# Patient Record
Sex: Female | Born: 1970
Health system: Southern US, Community
[De-identification: ages and names within clinical notes are randomized; demographics above are authoritative.]

## PROBLEM LIST (undated history)

## (undated) DIAGNOSIS — R609 Edema, unspecified: Secondary | ICD-10-CM

## (undated) DIAGNOSIS — K219 Gastro-esophageal reflux disease without esophagitis: Secondary | ICD-10-CM

## (undated) DIAGNOSIS — I10 Essential (primary) hypertension: Secondary | ICD-10-CM

## (undated) DIAGNOSIS — Z9889 Other specified postprocedural states: Secondary | ICD-10-CM

## (undated) DIAGNOSIS — E559 Vitamin D deficiency, unspecified: Secondary | ICD-10-CM

## (undated) DIAGNOSIS — J45909 Unspecified asthma, uncomplicated: Secondary | ICD-10-CM

## (undated) DIAGNOSIS — R112 Nausea with vomiting, unspecified: Secondary | ICD-10-CM

## (undated) DIAGNOSIS — D649 Anemia, unspecified: Secondary | ICD-10-CM

## (undated) DIAGNOSIS — E215 Disorder of parathyroid gland, unspecified: Secondary | ICD-10-CM

## (undated) DIAGNOSIS — T8859XA Other complications of anesthesia, initial encounter: Secondary | ICD-10-CM

## (undated) DIAGNOSIS — M549 Dorsalgia, unspecified: Secondary | ICD-10-CM

## (undated) DIAGNOSIS — R51 Headache: Secondary | ICD-10-CM

## (undated) DIAGNOSIS — M2550 Pain in unspecified joint: Secondary | ICD-10-CM

## (undated) DIAGNOSIS — R519 Headache, unspecified: Secondary | ICD-10-CM

## (undated) DIAGNOSIS — G43909 Migraine, unspecified, not intractable, without status migrainosus: Secondary | ICD-10-CM

## (undated) HISTORY — DX: Edema, unspecified: R60.9

## (undated) HISTORY — DX: Pain in unspecified joint: M25.50

## (undated) HISTORY — PX: TUBAL LIGATION: SHX77

## (undated) HISTORY — DX: Dorsalgia, unspecified: M54.9

## (undated) HISTORY — PX: ABDOMINAL HYSTERECTOMY: SHX81

## (undated) HISTORY — DX: Migraine, unspecified, not intractable, without status migrainosus: G43.909

## (undated) HISTORY — DX: Vitamin D deficiency, unspecified: E55.9

---

## 1999-07-07 HISTORY — PX: FOOT SURGERY: SHX648

## 2003-06-09 ENCOUNTER — Inpatient Hospital Stay (HOSPITAL_COMMUNITY): Admission: EM | Admit: 2003-06-09 | Discharge: 2003-06-13 | Payer: Self-pay | Admitting: Emergency Medicine

## 2003-06-20 ENCOUNTER — Emergency Department (HOSPITAL_COMMUNITY): Admission: EM | Admit: 2003-06-20 | Discharge: 2003-06-20 | Payer: Self-pay | Admitting: Emergency Medicine

## 2003-09-13 HISTORY — PX: ESOPHAGOGASTRODUODENOSCOPY: SHX1529

## 2010-05-02 ENCOUNTER — Emergency Department (HOSPITAL_COMMUNITY): Admission: EM | Admit: 2010-05-02 | Discharge: 2010-05-02 | Payer: Self-pay | Admitting: Emergency Medicine

## 2010-09-17 LAB — POCT URINALYSIS DIPSTICK
Bilirubin Urine: NEGATIVE
Glucose, UA: NEGATIVE mg/dL
Ketones, ur: NEGATIVE mg/dL
Nitrite: NEGATIVE
Protein, ur: NEGATIVE mg/dL
Specific Gravity, Urine: 1.02 (ref 1.005–1.030)
Urobilinogen, UA: 0.2 mg/dL (ref 0.0–1.0)
pH: 7 (ref 5.0–8.0)
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
## 2012-04-25 ENCOUNTER — Other Ambulatory Visit: Payer: Self-pay | Admitting: Obstetrics and Gynecology

## 2012-04-25 DIAGNOSIS — Z1231 Encounter for screening mammogram for malignant neoplasm of breast: Secondary | ICD-10-CM

## 2012-05-06 ENCOUNTER — Ambulatory Visit: Payer: Self-pay

## 2012-06-13 ENCOUNTER — Ambulatory Visit: Payer: Self-pay

## 2012-06-27 ENCOUNTER — Ambulatory Visit
Admission: RE | Admit: 2012-06-27 | Discharge: 2012-06-27 | Disposition: A | Discharge status: Home / Self Care | Payer: 59 | Source: Ambulatory Visit | Attending: Obstetrics and Gynecology | Admitting: Obstetrics and Gynecology

## 2012-06-27 DIAGNOSIS — Z1231 Encounter for screening mammogram for malignant neoplasm of breast: Secondary | ICD-10-CM

## 2013-12-06 ENCOUNTER — Other Ambulatory Visit: Payer: Self-pay

## 2013-12-06 DIAGNOSIS — Z1231 Encounter for screening mammogram for malignant neoplasm of breast: Secondary | ICD-10-CM

## 2013-12-18 ENCOUNTER — Ambulatory Visit
Admission: RE | Admit: 2013-12-18 | Discharge: 2013-12-18 | Disposition: A | Discharge status: Home / Self Care | Payer: 59 | Source: Ambulatory Visit

## 2013-12-18 DIAGNOSIS — Z1231 Encounter for screening mammogram for malignant neoplasm of breast: Secondary | ICD-10-CM

## 2013-12-20 ENCOUNTER — Other Ambulatory Visit: Payer: Self-pay | Admitting: Obstetrics and Gynecology

## 2013-12-20 DIAGNOSIS — R928 Other abnormal and inconclusive findings on diagnostic imaging of breast: Secondary | ICD-10-CM

## 2014-01-01 ENCOUNTER — Ambulatory Visit
Admission: RE | Admit: 2014-01-01 | Discharge: 2014-01-01 | Disposition: A | Discharge status: Home / Self Care | Payer: 59 | Source: Ambulatory Visit | Attending: Obstetrics and Gynecology | Admitting: Obstetrics and Gynecology

## 2014-01-01 DIAGNOSIS — R928 Other abnormal and inconclusive findings on diagnostic imaging of breast: Secondary | ICD-10-CM

## 2015-02-15 ENCOUNTER — Ambulatory Visit (INDEPENDENT_AMBULATORY_CARE_PROVIDER_SITE_OTHER): Payer: 59 | Admitting: Podiatry

## 2015-02-15 DIAGNOSIS — M722 Plantar fascial fibromatosis: Secondary | ICD-10-CM | POA: Diagnosis not present

## 2015-02-21 NOTE — Progress Notes (Signed)
Subjective:     Patient ID: [name], female   DOB: 10-Oct-1970, 45 y.o.   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 She has had pain to the same area over the last several months, but it has worsened recently. Denies any injury or trauma. She denies any swelling or redness. No tenderness. The pain does not wake up at night. Most of her pain is with weightbearing and after standing her feet all day. She states the pain is the same areas and has been previously. She is requesting a steroid injection at this time.  No other complaints at this time.  Review of Systems  All other systems reviewed and are negative.      Objective:   Physical Exam AAO x3, NAD DP/PT pulses palpable bilaterally, CRT less than 3 seconds Protective sensation intact with Simms Weinstein monofilament There is tenderness to palpation overlying the left foot just proximal to the sesamoid complex along the medial band of the plantar fascia. There is no overlying edema, erythema, increase in warmth. There is no overlying skin changes. There is no area pinpoint bony tenderness or pain the vibratory sensation. No other areas of tenderness to bilateral lower extremities. MMT 5/5, ROM WNL.  No open lesions or pre-ulcerative lesions.  No overlying edema, erythema, increase in warmth to bilateral lower extremities.  No pain with calf compression, swelling, warmth, erythema bilaterally.      Assessment:     44 year old female with left foot pain, plantar fasciitis (distal portion)     Plan:     -Treatment options discussed including all alternatives, risks, and complications -Patient elects to proceed with steroid injection into the left foot just proximal to the sesamoid complex. Under sterile skin preparation, a total of 2.5cc of kenalog 10, 0.5% Marcaine plain, and 2% lidocaine plain were infiltrated into the  symptomatic area without complication. A band-aid was applied. Patient tolerated the injection well without complication. Post-injection care with discussed with the patient. Discussed with the patient to ice the area over the next couple of days to help prevent a steroid flare.  -Anti-inflammatories as needed. -Ice and stretching activities discussed. -Continue with orthotics -Shoe gear changes. -Follow-up as needed. Call the office with any questions, concerns, change in symptoms.  Celesta Gentile, DPM

## 2015-05-21 ENCOUNTER — Other Ambulatory Visit: Payer: Self-pay

## 2015-05-21 DIAGNOSIS — Z1231 Encounter for screening mammogram for malignant neoplasm of breast: Secondary | ICD-10-CM

## 2015-06-14 ENCOUNTER — Ambulatory Visit
Admission: RE | Admit: 2015-06-14 | Discharge: 2015-06-14 | Disposition: A | Discharge status: Home / Self Care | Payer: 59 | Source: Ambulatory Visit

## 2015-06-14 DIAGNOSIS — Z1231 Encounter for screening mammogram for malignant neoplasm of breast: Secondary | ICD-10-CM

## 2015-07-17 MED FILL — VIT D2 1.25 MG (50,000 UNIT: 1.25 MG | 90 days supply | Qty: 24 | Fill #1

## 2015-07-26 MED FILL — MELOXICAM 15 MG TABLET: 15 | 30 days supply | Qty: 30 | Fill #0

## 2015-08-20 MED FILL — LISINOPRIL 20 MG TABLET: 20 | 90 days supply | Qty: 90 | Fill #2

## 2015-09-09 DIAGNOSIS — J329 Chronic sinusitis, unspecified: Secondary | ICD-10-CM | POA: Diagnosis not present

## 2015-09-09 DIAGNOSIS — Z13220 Encounter for screening for lipoid disorders: Secondary | ICD-10-CM | POA: Diagnosis not present

## 2015-09-09 DIAGNOSIS — M25562 Pain in left knee: Secondary | ICD-10-CM | POA: Diagnosis not present

## 2015-09-09 DIAGNOSIS — M25561 Pain in right knee: Secondary | ICD-10-CM | POA: Diagnosis not present

## 2015-09-09 DIAGNOSIS — G8929 Other chronic pain: Secondary | ICD-10-CM | POA: Diagnosis not present

## 2015-09-09 DIAGNOSIS — I10 Essential (primary) hypertension: Secondary | ICD-10-CM | POA: Diagnosis not present

## 2015-09-09 DIAGNOSIS — Z8669 Personal history of other diseases of the nervous system and sense organs: Secondary | ICD-10-CM | POA: Diagnosis not present

## 2015-09-09 MED FILL — FLUTICASONE PROP 50 MCG SPR: 50 | 90 days supply | Qty: 48 | Fill #0

## 2015-09-09 MED FILL — VALSARTAN 320 MG TABLET: 320 | 90 days supply | Qty: 90 | Fill #0

## 2015-09-09 MED FILL — SUMATRIPTAN SUCC 100 MG TAB: 100 | 90 days supply | Qty: 27 | Fill #0

## 2015-09-10 MED FILL — CLINDAMYCIN HCL 150 MG CAPS: 150 | 10 days supply | Qty: 30 | Fill #0

## 2015-09-16 DIAGNOSIS — M25561 Pain in right knee: Secondary | ICD-10-CM | POA: Diagnosis not present

## 2015-09-16 DIAGNOSIS — M222X2 Patellofemoral disorders, left knee: Secondary | ICD-10-CM | POA: Diagnosis not present

## 2015-09-16 DIAGNOSIS — M222X1 Patellofemoral disorders, right knee: Secondary | ICD-10-CM | POA: Diagnosis not present

## 2015-09-16 DIAGNOSIS — G8929 Other chronic pain: Secondary | ICD-10-CM | POA: Diagnosis not present

## 2015-09-16 DIAGNOSIS — M25562 Pain in left knee: Secondary | ICD-10-CM | POA: Diagnosis not present

## 2015-09-27 MED FILL — CLINDAMYCIN HCL 150 MG CAPS: 150 | 10 days supply | Qty: 30 | Fill #1

## 2015-09-30 DIAGNOSIS — M25562 Pain in left knee: Secondary | ICD-10-CM | POA: Diagnosis not present

## 2015-09-30 DIAGNOSIS — M222X1 Patellofemoral disorders, right knee: Secondary | ICD-10-CM | POA: Diagnosis not present

## 2015-09-30 DIAGNOSIS — M222X2 Patellofemoral disorders, left knee: Secondary | ICD-10-CM | POA: Diagnosis not present

## 2015-09-30 DIAGNOSIS — M25561 Pain in right knee: Secondary | ICD-10-CM | POA: Diagnosis not present

## 2015-09-30 DIAGNOSIS — G8929 Other chronic pain: Secondary | ICD-10-CM | POA: Diagnosis not present

## 2015-10-11 DIAGNOSIS — Z1239 Encounter for other screening for malignant neoplasm of breast: Secondary | ICD-10-CM | POA: Diagnosis not present

## 2015-10-11 DIAGNOSIS — Z01419 Encounter for gynecological examination (general) (routine) without abnormal findings: Secondary | ICD-10-CM | POA: Diagnosis not present

## 2015-10-18 DIAGNOSIS — M25561 Pain in right knee: Secondary | ICD-10-CM | POA: Diagnosis not present

## 2015-10-18 DIAGNOSIS — M25562 Pain in left knee: Secondary | ICD-10-CM | POA: Diagnosis not present

## 2015-10-18 DIAGNOSIS — G8929 Other chronic pain: Secondary | ICD-10-CM

## 2015-10-18 HISTORY — DX: Other chronic pain: G89.29

## 2015-11-06 DIAGNOSIS — D3705 Neoplasm of uncertain behavior of pharynx: Secondary | ICD-10-CM | POA: Diagnosis not present

## 2015-11-18 DIAGNOSIS — E210 Primary hyperparathyroidism: Secondary | ICD-10-CM | POA: Diagnosis not present

## 2015-11-19 ENCOUNTER — Other Ambulatory Visit (INDEPENDENT_AMBULATORY_CARE_PROVIDER_SITE_OTHER): Payer: Self-pay | Admitting: Otolaryngology

## 2015-11-19 DIAGNOSIS — E213 Hyperparathyroidism, unspecified: Secondary | ICD-10-CM

## 2015-11-25 ENCOUNTER — Ambulatory Visit (HOSPITAL_COMMUNITY): Payer: 59

## 2015-11-25 ENCOUNTER — Encounter (HOSPITAL_COMMUNITY): Payer: 59

## 2015-12-03 ENCOUNTER — Other Ambulatory Visit: Payer: Self-pay | Admitting: Otolaryngology

## 2015-12-06 ENCOUNTER — Encounter (HOSPITAL_COMMUNITY)
Admission: RE | Admit: 2015-12-06 | Discharge: 2015-12-06 | Disposition: A | Discharge status: Home / Self Care | Payer: 59 | Source: Ambulatory Visit | Attending: Otolaryngology | Admitting: Otolaryngology

## 2015-12-06 ENCOUNTER — Ambulatory Visit (HOSPITAL_COMMUNITY)
Admission: RE | Admit: 2015-12-06 | Discharge: 2015-12-06 | Disposition: A | Discharge status: Home / Self Care | Payer: 59 | Source: Ambulatory Visit | Attending: Otolaryngology | Admitting: Otolaryngology

## 2015-12-06 DIAGNOSIS — E079 Disorder of thyroid, unspecified: Secondary | ICD-10-CM | POA: Insufficient documentation

## 2015-12-06 DIAGNOSIS — E213 Hyperparathyroidism, unspecified: Secondary | ICD-10-CM | POA: Insufficient documentation

## 2015-12-06 DIAGNOSIS — E0590 Thyrotoxicosis, unspecified without thyrotoxic crisis or storm: Secondary | ICD-10-CM | POA: Diagnosis not present

## 2015-12-06 MED ORDER — TECHNETIUM TC 99M SESTAMIBI - CARDIOLITE
25.9000 | Freq: Once | INTRAVENOUS | Status: AC | PRN
  Administered 2015-12-06: 25.9 via INTRAVENOUS

## 2015-12-16 MED FILL — VALSARTAN 320 MG TABLET: 320 | 90 days supply | Qty: 90 | Fill #1

## 2015-12-18 MED FILL — SUMATRIPTAN SUCC 100 MG TAB: 100 | 90 days supply | Qty: 27 | Fill #0 | Status: TO

## 2015-12-31 ENCOUNTER — Encounter (HOSPITAL_BASED_OUTPATIENT_CLINIC_OR_DEPARTMENT_OTHER): Payer: Self-pay | Admitting: *Deleted

## 2016-01-03 ENCOUNTER — Encounter (HOSPITAL_BASED_OUTPATIENT_CLINIC_OR_DEPARTMENT_OTHER)
Admission: RE | Disposition: A | Discharge status: Home / Self Care | Payer: Self-pay | Source: Ambulatory Visit | Attending: Otolaryngology

## 2016-01-03 ENCOUNTER — Ambulatory Visit (HOSPITAL_BASED_OUTPATIENT_CLINIC_OR_DEPARTMENT_OTHER): Payer: 59 | Admitting: Anesthesiology

## 2016-01-03 ENCOUNTER — Encounter (HOSPITAL_BASED_OUTPATIENT_CLINIC_OR_DEPARTMENT_OTHER): Payer: Self-pay | Admitting: Anesthesiology

## 2016-01-03 ENCOUNTER — Ambulatory Visit (HOSPITAL_BASED_OUTPATIENT_CLINIC_OR_DEPARTMENT_OTHER)
Admission: RE | Admit: 2016-01-03 | Discharge: 2016-01-04 | Disposition: A | Discharge status: Home / Self Care | Payer: 59 | Source: Ambulatory Visit | Attending: Otolaryngology | Admitting: Otolaryngology

## 2016-01-03 DIAGNOSIS — E210 Primary hyperparathyroidism: Secondary | ICD-10-CM | POA: Insufficient documentation

## 2016-01-03 DIAGNOSIS — I10 Essential (primary) hypertension: Secondary | ICD-10-CM | POA: Insufficient documentation

## 2016-01-03 DIAGNOSIS — E069 Thyroiditis, unspecified: Secondary | ICD-10-CM | POA: Diagnosis not present

## 2016-01-03 DIAGNOSIS — J45909 Unspecified asthma, uncomplicated: Secondary | ICD-10-CM | POA: Diagnosis not present

## 2016-01-03 DIAGNOSIS — D351 Benign neoplasm of parathyroid gland: Secondary | ICD-10-CM

## 2016-01-03 DIAGNOSIS — E041 Nontoxic single thyroid nodule: Secondary | ICD-10-CM | POA: Diagnosis not present

## 2016-01-03 HISTORY — DX: Headache: R51

## 2016-01-03 HISTORY — DX: Unspecified asthma, uncomplicated: J45.909

## 2016-01-03 HISTORY — PX: PARATHYROIDECTOMY: SHX19

## 2016-01-03 HISTORY — DX: Headache, unspecified: R51.9

## 2016-01-03 HISTORY — DX: Gastro-esophageal reflux disease without esophagitis: K21.9

## 2016-01-03 HISTORY — DX: Benign neoplasm of parathyroid gland: D35.1

## 2016-01-03 HISTORY — DX: Essential (primary) hypertension: I10

## 2016-01-03 HISTORY — DX: Disorder of parathyroid gland, unspecified: E21.5

## 2016-01-03 HISTORY — DX: Anemia, unspecified: D64.9

## 2016-01-03 SURGERY — PARATHYROIDECTOMY
Anesthesia: General | Site: Neck

## 2016-01-03 MED ORDER — BACITRACIN ZINC 500 UNIT/GM EX OINT
TOPICAL_OINTMENT | CUTANEOUS | Status: AC
  Filled 2016-01-03: qty 0.9

## 2016-01-03 MED ORDER — ONDANSETRON HCL 4 MG/2ML IJ SOLN
INTRAMUSCULAR | Status: DC | PRN
  Administered 2016-01-03: 4 mg via INTRAVENOUS

## 2016-01-03 MED ORDER — HYDROMORPHONE HCL 1 MG/ML IJ SOLN
0.2500 mg | INTRAMUSCULAR | Status: DC | PRN
  Administered 2016-01-03: 0.5 mg via INTRAVENOUS

## 2016-01-03 MED ORDER — SCOPOLAMINE 1 MG/3DAYS TD PT72
1.0000 | MEDICATED_PATCH | Freq: Once | TRANSDERMAL | Status: DC | PRN
  Administered 2016-01-03: 1.5 mg via TRANSDERMAL

## 2016-01-03 MED ORDER — FENTANYL CITRATE (PF) 100 MCG/2ML IJ SOLN: 50.0000 ug | INTRAMUSCULAR | Status: DC | PRN

## 2016-01-03 MED ORDER — SCOPOLAMINE 1 MG/3DAYS TD PT72
MEDICATED_PATCH | TRANSDERMAL | Status: AC
  Filled 2016-01-03: qty 1

## 2016-01-03 MED ORDER — BACITRACIN ZINC 500 UNIT/GM EX OINT
TOPICAL_OINTMENT | CUTANEOUS | Status: AC
  Filled 2016-01-03: qty 28.35

## 2016-01-03 MED ORDER — METOCLOPRAMIDE HCL 5 MG/ML IJ SOLN: 10.0000 mg | Freq: Once | INTRAMUSCULAR | Status: DC | PRN

## 2016-01-03 MED ORDER — OXYCODONE-ACETAMINOPHEN 5-325 MG PO TABS
1.0000 | ORAL_TABLET | ORAL | Status: DC | PRN
  Administered 2016-01-03: 1 via ORAL
  Filled 2016-01-03: qty 1

## 2016-01-03 MED ORDER — LIDOCAINE-EPINEPHRINE 1 %-1:100000 IJ SOLN
INTRAMUSCULAR | Status: DC | PRN
  Administered 2016-01-03: 4 mL

## 2016-01-03 MED ORDER — MAGNESIUM OXIDE 400 MG PO TABS: 400.0000 mg | ORAL_TABLET | Freq: Every day | ORAL | Status: DC

## 2016-01-03 MED ORDER — MEPERIDINE HCL 25 MG/ML IJ SOLN: 6.2500 mg | INTRAMUSCULAR | Status: DC | PRN

## 2016-01-03 MED ORDER — ONDANSETRON HCL 4 MG/2ML IJ SOLN: 4.0000 mg | INTRAMUSCULAR | Status: DC | PRN

## 2016-01-03 MED ORDER — MORPHINE SULFATE (PF) 2 MG/ML IV SOLN: 2.0000 mg | INTRAVENOUS | Status: DC | PRN

## 2016-01-03 MED ORDER — LIDOCAINE HCL (CARDIAC) 20 MG/ML IV SOLN
INTRAVENOUS | Status: DC | PRN
  Administered 2016-01-03: 50 mg via INTRAVENOUS

## 2016-01-03 MED ORDER — ONDANSETRON HCL 4 MG PO TABS: 4.0000 mg | ORAL_TABLET | ORAL | Status: DC | PRN

## 2016-01-03 MED ORDER — SUMATRIPTAN SUCCINATE 50 MG PO TABS
50.0000 mg | ORAL_TABLET | ORAL | Status: DC | PRN
  Administered 2016-01-03: 50 mg via ORAL
  Filled 2016-01-03: qty 1

## 2016-01-03 MED ORDER — HYDROMORPHONE HCL 1 MG/ML IJ SOLN
INTRAMUSCULAR | Status: AC
  Filled 2016-01-03: qty 1

## 2016-01-03 MED ORDER — LACTATED RINGERS IV SOLN
INTRAVENOUS | Status: DC
  Administered 2016-01-03 (×2): via INTRAVENOUS
  Administered 2016-01-03: 10 mL/h via INTRAVENOUS

## 2016-01-03 MED ORDER — PROPOFOL 10 MG/ML IV BOLUS
INTRAVENOUS | Status: DC | PRN
  Administered 2016-01-03: 100 mg via INTRAVENOUS

## 2016-01-03 MED ORDER — SUCCINYLCHOLINE CHLORIDE 20 MG/ML IJ SOLN
INTRAMUSCULAR | Status: DC | PRN
  Administered 2016-01-03: 100 mg via INTRAVENOUS

## 2016-01-03 MED ORDER — HYDROCODONE-ACETAMINOPHEN 7.5-325 MG PO TABS: 1.0000 | ORAL_TABLET | Freq: Once | ORAL | Status: DC | PRN

## 2016-01-03 MED ORDER — SUFENTANIL CITRATE 50 MCG/ML IV SOLN
INTRAVENOUS | Status: DC | PRN
  Administered 2016-01-03: 20 ug via INTRAVENOUS

## 2016-01-03 MED ORDER — AMOXICILLIN 875 MG PO TABS: 875.0000 mg | ORAL_TABLET | Freq: Two times a day (BID) | ORAL | Status: DC

## 2016-01-03 MED ORDER — DEXAMETHASONE SODIUM PHOSPHATE 4 MG/ML IJ SOLN
INTRAMUSCULAR | Status: DC | PRN
  Administered 2016-01-03: 10 mg via INTRAVENOUS

## 2016-01-03 MED ORDER — CEFAZOLIN SODIUM-DEXTROSE 2-3 GM-% IV SOLR
INTRAVENOUS | Status: DC | PRN
  Administered 2016-01-03: 2 g via INTRAVENOUS

## 2016-01-03 MED ORDER — MIDAZOLAM HCL 2 MG/2ML IJ SOLN
1.0000 mg | INTRAMUSCULAR | Status: DC | PRN
  Administered 2016-01-03: 2 mg via INTRAVENOUS

## 2016-01-03 MED ORDER — SUFENTANIL CITRATE 50 MCG/ML IV SOLN
INTRAVENOUS | Status: AC
  Filled 2016-01-03: qty 1

## 2016-01-03 MED ORDER — PROPOFOL 10 MG/ML IV BOLUS
INTRAVENOUS | Status: AC
  Filled 2016-01-03: qty 20

## 2016-01-03 MED ORDER — CEFAZOLIN SODIUM-DEXTROSE 2-4 GM/100ML-% IV SOLN
INTRAVENOUS | Status: AC
  Filled 2016-01-03: qty 100

## 2016-01-03 MED ORDER — IRBESARTAN 150 MG PO TABS: 150.0000 mg | ORAL_TABLET | Freq: Every day | ORAL | Status: DC

## 2016-01-03 MED ORDER — MIDAZOLAM HCL 2 MG/2ML IJ SOLN
INTRAMUSCULAR | Status: AC
  Filled 2016-01-03: qty 2

## 2016-01-03 MED ORDER — KCL IN DEXTROSE-NACL 20-5-0.45 MEQ/L-%-% IV SOLN
INTRAVENOUS | Status: DC
  Administered 2016-01-03: 17:00:00 via INTRAVENOUS
  Filled 2016-01-03: qty 1000

## 2016-01-03 MED ORDER — OXYCODONE-ACETAMINOPHEN 5-325 MG PO TABS: 1.0000 | ORAL_TABLET | ORAL | Status: DC | PRN

## 2016-01-03 MED ORDER — EPHEDRINE SULFATE 50 MG/ML IJ SOLN
INTRAMUSCULAR | Status: DC | PRN
  Administered 2016-01-03: 15 mg via INTRAVENOUS

## 2016-01-03 MED ORDER — GLYCOPYRROLATE 0.2 MG/ML IJ SOLN: 0.2000 mg | Freq: Once | INTRAMUSCULAR | Status: DC | PRN

## 2016-01-03 MED FILL — AMOXICILLIN 875 MG TABLET: 875 | 5 days supply | Qty: 10 | Fill #0

## 2016-01-03 MED FILL — OXYCODONE/APAP 5-325: 5-325 | 5 days supply | Qty: 30 | Fill #0

## 2016-01-03 SURGICAL SUPPLY — 61 items
ATTRACTOMAT 16X20 MAGNETIC DRP (DRAPES) IMPLANT
BLADE CLIPPER SURG (BLADE) IMPLANT
BLADE SURG 10 STRL SS (BLADE) IMPLANT
BLADE SURG 15 STRL LF DISP TIS (BLADE) ×1 IMPLANT
BLADE SURG 15 STRL SS (BLADE) ×1
CANISTER SUCT 1200ML W/VALVE (MISCELLANEOUS) ×2 IMPLANT
CLIP TI WIDE RED SMALL 6 (CLIP) IMPLANT
CORDS BIPOLAR (ELECTRODE) ×2 IMPLANT
COVER BACK TABLE 60X90IN (DRAPES) ×2 IMPLANT
COVER MAYO STAND STRL (DRAPES) ×2 IMPLANT
DECANTER SPIKE VIAL GLASS SM (MISCELLANEOUS) ×2 IMPLANT
DERMABOND ADVANCED (GAUZE/BANDAGES/DRESSINGS) ×1
DERMABOND ADVANCED .7 DNX12 (GAUZE/BANDAGES/DRESSINGS) ×1 IMPLANT
DRAIN CHANNEL 10F 3/8 F FF (DRAIN) IMPLANT
DRAIN CHANNEL 7F FF FLAT (WOUND CARE) ×2 IMPLANT
DRAPE U-SHAPE 76X120 STRL (DRAPES) ×2 IMPLANT
ELECT COATED BLADE 2.86 ST (ELECTRODE) ×2 IMPLANT
ELECT REM PT RETURN 9FT ADLT (ELECTROSURGICAL) ×2
ELECTRODE REM PT RTRN 9FT ADLT (ELECTROSURGICAL) ×1 IMPLANT
EVACUATOR SILICONE 100CC (DRAIN) ×2 IMPLANT
FORCEPS BIPOLAR SPETZLER 8 1.0 (NEUROSURGERY SUPPLIES) ×2 IMPLANT
GAUZE SPONGE 4X4 16PLY XRAY LF (GAUZE/BANDAGES/DRESSINGS) ×2 IMPLANT
GLOVE BIO SURGEON STRL SZ 6.5 (GLOVE) ×2 IMPLANT
GLOVE BIO SURGEON STRL SZ7.5 (GLOVE) ×2 IMPLANT
GLOVE BIOGEL PI IND STRL 7.0 (GLOVE) ×1 IMPLANT
GLOVE BIOGEL PI INDICATOR 7.0 (GLOVE) ×1
GLOVE ECLIPSE 6.5 STRL STRAW (GLOVE) ×2 IMPLANT
GLOVE SURG SS PI 7.0 STRL IVOR (GLOVE) ×2 IMPLANT
GOWN STRL REUS W/ TWL LRG LVL3 (GOWN DISPOSABLE) ×2 IMPLANT
GOWN STRL REUS W/TWL LRG LVL3 (GOWN DISPOSABLE) ×2
HEMOSTAT SNOW SURGICEL 2X4 (HEMOSTASIS) ×2 IMPLANT
HEMOSTAT SURGICEL 2X14 (HEMOSTASIS) IMPLANT
NEEDLE HYPO 25X1 1.5 SAFETY (NEEDLE) ×2 IMPLANT
NS IRRIG 1000ML POUR BTL (IV SOLUTION) ×2 IMPLANT
PACK BASIN DAY SURGERY FS (CUSTOM PROCEDURE TRAY) ×2 IMPLANT
PENCIL BUTTON HOLSTER BLD 10FT (ELECTRODE) ×2 IMPLANT
PIN SAFETY STERILE (MISCELLANEOUS) IMPLANT
PROBE NERVBE PRASS .33 (MISCELLANEOUS) ×2 IMPLANT
SHEARS HARMONIC 9CM CVD (BLADE) ×2 IMPLANT
SLEEVE SCD COMPRESS KNEE MED (MISCELLANEOUS) ×2 IMPLANT
SPONGE GAUZE 4X4 12PLY STER LF (GAUZE/BANDAGES/DRESSINGS) IMPLANT
SPONGE INTESTINAL PEANUT (DISPOSABLE) ×2 IMPLANT
STAPLER VISISTAT 35W (STAPLE) IMPLANT
SUT ETHILON 3 0 PS 1 (SUTURE) ×2 IMPLANT
SUT PROLENE 5 0 P 3 (SUTURE) IMPLANT
SUT SILK 2 0 SH (SUTURE) ×2 IMPLANT
SUT SILK 2 0 TIES 17X18 (SUTURE)
SUT SILK 2-0 18XBRD TIE BLK (SUTURE) IMPLANT
SUT SILK 3 0 TIES 17X18 (SUTURE) ×2
SUT SILK 3-0 18XBRD TIE BLK (SUTURE) ×2 IMPLANT
SUT VIC AB 3-0 FS2 27 (SUTURE) ×2 IMPLANT
SUT VICRYL 4-0 PS2 18IN ABS (SUTURE) ×2 IMPLANT
SYR BULB 3OZ (MISCELLANEOUS) ×2 IMPLANT
SYR CONTROL 10ML LL (SYRINGE) ×2 IMPLANT
TOWEL OR 17X24 6PK STRL BLUE (TOWEL DISPOSABLE) ×4 IMPLANT
TRAY DSU PREP LF (CUSTOM PROCEDURE TRAY) ×2 IMPLANT
TUBE CONNECTING 20X1/4 (TUBING) ×2 IMPLANT
TUBE ENDOTRAC NIMS EMG 6MM (MISCELLANEOUS) IMPLANT
TUBE ENDOTRAC NIMS EMG 7MM (MISCELLANEOUS) ×2 IMPLANT
TUBE ENDOTRAC NIMS EMG 8MM (MISCELLANEOUS) IMPLANT
TUBE ENDOTRAC NIMS EMG 9MM (MISCELLANEOUS) IMPLANT

## 2016-01-03 NOTE — Transfer of Care (Signed)
Immediate Anesthesia Transfer of Care Note  Patient: [name]  Procedure(s) Performed: Procedure(s): PARATHYROIDECTOMY (N/A)  Patient Location: PACU  Anesthesia Type:General  Level of Consciousness: awake, alert  and oriented  Airway & Oxygen Therapy: Patient Spontanous Breathing and Patient connected to face mask oxygen  Post-op Assessment: Report given to RN and Post -op Vital signs reviewed and stable  Post vital signs: Reviewed and stable  Last Vitals:  Filed Vitals:   01/03/16 1157  BP: 126/76  Pulse: 73  Temp: 36.8 C  Resp: 18    Last Pain: There were no vitals filed for this visit.    Patients Stated Pain Goal: 0 [phone]  Complications: No apparent anesthesia complications

## 2016-01-03 NOTE — Discharge Instructions (Addendum)
° °  Parathyroidectomy, Care After Refer to this sheet in the next few weeks. These instructions provide you with information about caring for yourself after your procedure. Your health care provider may also give you more specific instructions. Your treatment has been planned according to current medical practices, but problems sometimes occur. Call your health care provider if you have any problems or questions after your procedure. WHAT TO EXPECT AFTER THE PROCEDURE After your procedure, it is typical to have:  Mild pain in the neck or upper body, especially when swallowing.  A sore throat.  A weak voice. HOME CARE INSTRUCTIONS   Take medicines only as directed by your health care provider.  Some pain medicines cause constipation. Drink enough fluid to keep your urine clear or pale yellow. This can help to prevent constipation.  Start slowly with eating. You may need to have only liquids and soft foods for a few days or as directed by your health care provider.  Resume your usual activities as directed by your health care provider.  For the first 10 days after the procedure or as instructed by your health care provider:  Do not lift anything heavier than 20 lb (9.1 kg).  Do not jog, swim, or do other strenuous exercises.  Do not play contact sports.  Keep all follow-up visits as directed by your health care provider. This is important. SEEK MEDICAL CARE IF:  The soreness in your throat gets worse.  You have increased pain at your incision or incisions.  You have increased bleeding from an incision.  Your incision becomes infected. Watch for:  Swelling.  Redness.  Warmth.  Pus.  You notice a bad smell coming from an incision or dressing.  You have a fever.  You feel lightheaded or faint.  You have numbness, tingling, or muscle spasms in your:  Arms.  Hands.  Feet.  Face.  You have trouble swallowing. SEEK IMMEDIATE MEDICAL CARE IF:   You develop a  rash.  You have difficulty breathing.  You hear whistling noises coming from your chest.  You develop a cough that gets worse.  Your speech changes, or you have hoarseness that gets worse.   This information is not intended to replace advice given to you by your health care provider. Make sure you discuss any questions you have with your health care provider.   Document Released: 01/09/2005 Document Revised: 07/13/2014 Document Reviewed: 11/22/2013 Elsevier Interactive Patient Education 2016 [employer].     Special Instructions/Symptoms: Your throat may feel dry or sore from the anesthesia or the breathing tube placed in your throat during surgery. If this causes discomfort, gargle with warm salt water. The discomfort should disappear within 24 hours.  If you had a scopolamine patch placed behind your ear for the management of post- operative nausea and/or vomiting:  1. The medication in the patch is effective for 72 hours, after which it should be removed.  Wrap patch in a tissue and discard in the trash. Wash hands thoroughly with soap and water. 2. You may remove the patch earlier than 72 hours if you experience unpleasant side effects which may include dry mouth, dizziness or visual disturbances. 3. Avoid touching the patch. Wash your hands with soap and water after contact with the patch.

## 2016-01-03 NOTE — Anesthesia Postprocedure Evaluation (Signed)
Anesthesia Post Note  Patient: [name]  Procedure(s) Performed: Procedure(s) (LRB): PARATHYROIDECTOMY (N/A)  Patient location during evaluation: PACU Anesthesia Type: General Level of consciousness: awake and alert Pain management: pain level controlled Vital Signs Assessment: post-procedure vital signs reviewed and stable Respiratory status: spontaneous breathing, nonlabored ventilation, respiratory function stable and patient connected to nasal cannula oxygen Cardiovascular status: blood pressure returned to baseline and stable Postop Assessment: no signs of nausea or vomiting Anesthetic complications: no    Last Vitals:  Filed Vitals:   01/03/16 1515 01/03/16 1530  BP: 132/85 131/79  Pulse: 97 88  Temp:    Resp: 13 14    Last Pain:  Filed Vitals:   01/03/16 1532  PainSc: 4                  Marua Qin S

## 2016-01-03 NOTE — H&P (Signed)
Cc: Parathyroid adenoma  HPI: The patient is a 45 year old female who returns today for her follow-up evaluation.  She was last seen 2 weeks ago.  At that time, she was noted to have hypercalcemia of unknown etiology. She was mildly symptomatic, with a tingling sensation in her toes and fingers and diffuse joint discomfort.  She was also noted to have a benign appearing cyst on her right tonsil.  Her total calcium and PTH levels were checked after the last visit.  Her calciums was high normal at 10.1.  However, her parathyroid hormone level was severely elevated at 184.  The results are consistent with primary hyperparathyroidism.  Her subsequent nuclear medicine scan showed a focal lesion inferior and posterior to the right thyroid lobe, consistent with a parathyroid adenoma.  Objective General: Communicates without difficulty, well nourished, no acute distress. Head: Normocephalic, no evidence injury, no tenderness, facial buttresses intact without stepoff. Eyes: PERRL, EOMI. No scleral icterus, conjunctivae clear. Neuro: CN II exam reveals vision grossly intact.  No nystagmus at any point of gaze. Ears: Auricles well formed without lesions.  Ear canals are intact without mass or lesion.  No erythema or edema is appreciated.  The TMs are intact without fluid. Nose: External evaluation reveals normal support and skin without lesions.  Dorsum is intact.  Anterior rhinoscopy reveals healthy pink mucosa over anterior aspect of inferior turbinates and intact septum.  No purulence noted. Oral:  Oral cavity and oropharynx are intact, symmetric, without erythema or edema.  Mucosa is moist. A small cyst is noted on the patient's right tonsil. Neck: Full range of motion without pain.  There is no significant lymphadenopathy.  No masses palpable.  Thyroid bed within normal limits to palpation.  Parotid glands and submandibular glands equal bilaterally without mass.  Trachea is midline. Neuro:  CN 2-12 grossly intact.  Gait normal. Vestibular: No nystagmus at any point of gaze. The cerebellar examination is unremarkable.   Assessment 1.  Primary hyperparathyroidism, secondary to a right inferior parathyroid adenoma. 2.  A stable benign cyst is noted on the patient's right tonsil.   Plan  1.  The physical exam findings and the laboratory results are reviewed with the patient.   2.  We will proceed with parathyroidectomy surgery.  The risks, benefits, alternatives and details of the procedure are extensively discussed.  Questions are invited and answered.  4.  The patient would like to proceed with the procedure.

## 2016-01-03 NOTE — Anesthesia Procedure Notes (Signed)
Procedure Name: Intubation Date/Time: 01/03/2016 1:02 PM Performed by: Melynda Ripple D Pre-anesthesia Checklist: Patient identified, Emergency Drugs available, Suction available and Patient being monitored Patient Re-evaluated:Patient Re-evaluated prior to inductionOxygen Delivery Method: Circle system utilized Preoxygenation: Pre-oxygenation with 100% oxygen Intubation Type: IV induction Ventilation: Mask ventilation without difficulty Laryngoscope Size: Mac and 3 Grade View: Grade I Tube type: Oral Tube size: 7.0 (NIMS tube) mm Number of attempts: 1 Airway Equipment and Method: Stylet and Oral airway Placement Confirmation: ETT inserted through vocal cords under direct vision,  positive ETCO2 and breath sounds checked- equal and bilateral Secured at: 21 cm Tube secured with: Tape Dental Injury: Teeth and Oropharynx as per pre-operative assessment

## 2016-01-03 NOTE — Op Note (Signed)
DATE OF PROCEDURE:  01/03/2016                              OPERATIVE REPORT  SURGEON:  Leta Baptist, MD  PREOPERATIVE DIAGNOSES: 1. Right parathyroid adenoma  POSTOPERATIVE DIAGNOSES: 1. Right parathyroid adenoma  PROCEDURE PERFORMED:  Parathyroidectomy  ANESTHESIA:  General endotracheal tube anesthesia.  COMPLICATIONS:  None.  ESTIMATED BLOOD LOSS:  Less than 50 mL  INDICATION FOR PROCEDURE:  [name] is a 45 y.o. female with a history of hypercalcemia. The patient was mildly symptomatic, with a tingling sensation in her toes and fingers, and diffuse joint discomfort. Her parathyroid hormone level was elevated at 184. On her nuclear medicine scan, a lesion was noted posterior and inferior to the right thyroid lobe. The finding was consistent with a parathyroid adenoma. Based on the above findings, the decision was made for patient to undergo the above-stated procedure. Likelihood of success in reducing symptoms was also discussed.  The risks, benefits, alternatives, and details of the procedure were discussed with the patient.  Questions were invited and answered.  Informed consent was obtained.  DESCRIPTION:  The patient was taken to the operating room and placed supine on the operating table.  General endotracheal tube anesthesia was administered by the anesthesiologist. The recurrent laryngeal nerve monitoring tube was used. The patient was positioned and prepped and draped in a standard fashion for parathyroidectomy surgery.   1% lidocaine with 1-100,000 epinephrine was infiltrated at the planned site of incision. A transverse lower neck incision was made in the standard fashion. The incision was carried down to the level of the platysma muscles. Superior and inferiorly based subplatysmal flaps were elevated in a standard fashion. The strap muscles were divided at midline and retracted laterally, exposing the thyroid lobe. Attention was focused on the right thyroid lobe. Careful dissection  was performed to free the right thyroid lobe, and it was reflected medially, exposing a large 3 cm parathyroid adenoma. The recurrent laryngeal nerve was also identified on the right side. It was preserved. The nerve was functional throughout the case. The parathyroid adenoma was resected free from the recurrent laryngeal nerve. The entire specimen was sent to the pathology department for frozen section analysis. It was confirmed as parathyroid tissue. 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 It was also sent to the pathology department. Post op PTH and total calcium labs were drawn.  The surgical site was copiously irrigated. A #10 JP drain was placed. The strap muscles were reapproximated and closed with 4-0 Vicryl sutures. The incision was closed in layers with 4-0 Vicryl and Dermabond.  The care of the patient was turned over to the anesthesiologist.  The patient was awakened from anesthesia without difficulty.  She was extubated and transferred to the recovery room in good condition.  OPERATIVE FINDINGS:  A 3 cm right parathyroid adenoma.  SPECIMEN:  Parathyroid adenoma, and ectopic thyroid tissue.  FOLLOWUP CARE:  The patient will be observed overnight at the surgery center. The patient will follow up in my office in approximately 1 week.  Ascencion Dike 01/03/2016 2:45 PM

## 2016-01-03 NOTE — Anesthesia Preprocedure Evaluation (Addendum)
Anesthesia Evaluation  Patient identified by MRN, date of birth, ID band Patient awake    Reviewed: Allergy & Precautions, NPO status , Patient's Chart, lab work & pertinent test results  Airway Mallampati: II  TM Distance: >3 FB Neck ROM: Full    Dental no notable dental hx.    Pulmonary asthma ,    Pulmonary exam normal breath sounds clear to auscultation       Cardiovascular hypertension, Pt. on medications Normal cardiovascular exam Rhythm:Regular Rate:Normal     Neuro/Psych  Headaches, negative psych ROS   GI/Hepatic GERD  Medicated and Controlled,  Endo/Other  Parathyroid adenoma  Renal/GU   negative genitourinary   Musculoskeletal negative musculoskeletal ROS (+)   Abdominal   Peds  Hematology  (+) anemia ,   Anesthesia Other Findings   Reproductive/Obstetrics negative OB ROS                            Anesthesia Physical Anesthesia Plan  ASA: II  Anesthesia Plan: General   Post-op Pain Management:    Induction: Intravenous  Airway Management Planned: Oral ETT  Additional Equipment:   Intra-op Plan:   Post-operative Plan: Extubation in OR  Informed Consent: I have reviewed the patients History and Physical, chart, labs and discussed the procedure including the risks, benefits and alternatives for the proposed anesthesia with the patient or authorized representative who has indicated his/her understanding and acceptance.   Dental advisory given  Plan Discussed with: Anesthesiologist, CRNA and Surgeon  Anesthesia Plan Comments:         Anesthesia Quick Evaluation

## 2016-01-04 LAB — PTH, INTACT AND CALCIUM
Calcium, Total (PTH): 7.5 mg/dL — ABNORMAL LOW (ref 8.7–10.2)
PTH: 18 pg/mL (ref 15–65)

## 2016-01-04 NOTE — Discharge Summary (Signed)
Physician Discharge Summary  Patient ID: [name] MRN: [id number] DOB/AGE: Nov 01, 1970 45 y.o.  Admit date: 01/03/2016 Discharge date: 01/04/2016  Admission Diagnoses: Right parathyroid adenoma  Discharge Diagnoses: Right parathyroid adenoma Active Problems:   Parathyroid adenoma   Discharged Condition: good  Hospital Course: Pt had an uneventful overnight stay. Pt tolerated po well. No bleeding. No stridor. Voice is strong.  Consults: None  Significant Diagnostic Studies: None  Treatments: surgery: Parathyroidectomy  Discharge Exam: Blood pressure 110/68, pulse 81, temperature 98.4 F (36.9 C), temperature source Oral, resp. rate 14, height 5\' 6"  (1.676 m), weight 86.694 kg (191 lb 2 oz), last menstrual period 12/12/2015, SpO2 99 %. Incision/Wound:c/d/i  Disposition: 01-Home or Self Care     Medication List    TAKE these medications        amoxicillin 875 MG tablet  Commonly known as:  AMOXIL  Take 1 tablet (875 mg total) by mouth 2 (two) times daily.     magnesium oxide 400 MG tablet  Commonly known as:  MAG-OX  Take 400 mg by mouth daily.     oxyCODONE-acetaminophen 5-325 MG tablet  Commonly known as:  ROXICET  Take 1 tablet by mouth every 4 (four) hours as needed for moderate pain or severe pain.     SUMAtriptan 50 MG tablet  Commonly known as:  IMITREX  Take 50 mg by mouth every 2 (two) hours as needed for migraine. May repeat in 2 hours if headache persists or recurs.     valsartan 160 MG tablet  Commonly known as:  DIOVAN  Take 160 mg by mouth daily.           Follow-up Information    Follow up with Ascencion Dike, MD On 01/15/2016.   Specialty:  Otolaryngology   Why:  1:30pm   Contact information:   [address] [place] [address] [phone]       Signed: Ascencion Dike 01/04/2016, 9:48 AM

## 2016-01-06 ENCOUNTER — Encounter (HOSPITAL_BASED_OUTPATIENT_CLINIC_OR_DEPARTMENT_OTHER): Payer: Self-pay | Admitting: Otolaryngology

## 2016-01-06 NOTE — Addendum Note (Signed)
Addendum  created 01/06/16 1142 by Ernesta Amble Armentha Branagan, CRNA   Modules edited: Charges VN

## 2016-01-13 ENCOUNTER — Telehealth: Payer: Self-pay | Admitting: *Deleted

## 2016-01-13 NOTE — Telephone Encounter (Signed)
Entered in error

## 2016-01-15 DIAGNOSIS — E58 Dietary calcium deficiency: Secondary | ICD-10-CM | POA: Diagnosis not present

## 2016-01-15 DIAGNOSIS — R946 Abnormal results of thyroid function studies: Secondary | ICD-10-CM | POA: Diagnosis not present

## 2016-01-21 DIAGNOSIS — E58 Dietary calcium deficiency: Secondary | ICD-10-CM | POA: Diagnosis not present

## 2016-01-21 DIAGNOSIS — R946 Abnormal results of thyroid function studies: Secondary | ICD-10-CM | POA: Diagnosis not present

## 2016-01-28 DIAGNOSIS — E559 Vitamin D deficiency, unspecified: Secondary | ICD-10-CM | POA: Diagnosis not present

## 2016-02-07 MED FILL — VIT D2 1.25 MG (50,000 UNIT: 1.25 MG | 84 days supply | Qty: 24 | Fill #0

## 2016-03-24 MED FILL — AMOXICILLIN 400 MG/5 ML SUS: 400 | 7 days supply | Qty: 100 | Fill #0

## 2016-03-27 DIAGNOSIS — E210 Primary hyperparathyroidism: Secondary | ICD-10-CM | POA: Diagnosis not present

## 2016-03-27 DIAGNOSIS — Z23 Encounter for immunization: Secondary | ICD-10-CM | POA: Diagnosis not present

## 2016-03-27 DIAGNOSIS — E559 Vitamin D deficiency, unspecified: Secondary | ICD-10-CM | POA: Diagnosis not present

## 2016-03-27 DIAGNOSIS — I10 Essential (primary) hypertension: Secondary | ICD-10-CM | POA: Diagnosis not present

## 2016-03-27 MED FILL — VALSARTAN 320 MG TABLET: 320 | 90 days supply | Qty: 90 | Fill #2

## 2016-03-31 MED FILL — FLUCONAZOLE 150 MG TABLET: 150 | 1 days supply | Qty: 1 | Fill #0

## 2016-04-09 DIAGNOSIS — E559 Vitamin D deficiency, unspecified: Secondary | ICD-10-CM | POA: Diagnosis not present

## 2016-04-09 DIAGNOSIS — E210 Primary hyperparathyroidism: Secondary | ICD-10-CM | POA: Diagnosis not present

## 2016-04-09 DIAGNOSIS — R5383 Other fatigue: Secondary | ICD-10-CM | POA: Diagnosis not present

## 2016-04-15 DIAGNOSIS — D351 Benign neoplasm of parathyroid gland: Secondary | ICD-10-CM | POA: Diagnosis not present

## 2016-04-15 DIAGNOSIS — E210 Primary hyperparathyroidism: Secondary | ICD-10-CM | POA: Diagnosis not present

## 2016-04-28 MED FILL — SUMATRIPTAN SUCC 100 MG TAB: 100 | 90 days supply | Qty: 27 | Fill #0

## 2016-05-15 DIAGNOSIS — N852 Hypertrophy of uterus: Secondary | ICD-10-CM | POA: Diagnosis not present

## 2016-05-15 DIAGNOSIS — N926 Irregular menstruation, unspecified: Secondary | ICD-10-CM | POA: Diagnosis not present

## 2016-05-19 MED FILL — MEDROXYPROGESTERONE 10 MG T: 10 | 7 days supply | Qty: 7 | Fill #0

## 2016-06-26 ENCOUNTER — Other Ambulatory Visit: Payer: Self-pay | Admitting: Obstetrics and Gynecology

## 2016-06-26 DIAGNOSIS — R252 Cramp and spasm: Secondary | ICD-10-CM | POA: Diagnosis not present

## 2016-06-26 DIAGNOSIS — I10 Essential (primary) hypertension: Secondary | ICD-10-CM | POA: Diagnosis not present

## 2016-06-26 DIAGNOSIS — N926 Irregular menstruation, unspecified: Secondary | ICD-10-CM | POA: Diagnosis not present

## 2016-06-26 DIAGNOSIS — Z1231 Encounter for screening mammogram for malignant neoplasm of breast: Secondary | ICD-10-CM

## 2016-06-26 DIAGNOSIS — N852 Hypertrophy of uterus: Secondary | ICD-10-CM | POA: Diagnosis not present

## 2016-06-26 DIAGNOSIS — D259 Leiomyoma of uterus, unspecified: Secondary | ICD-10-CM | POA: Diagnosis not present

## 2016-06-26 MED FILL — VALSARTAN 320 MG TABLET: 320 | 90 days supply | Qty: 90 | Fill #0

## 2016-07-01 ENCOUNTER — Encounter (INDEPENDENT_AMBULATORY_CARE_PROVIDER_SITE_OTHER): Payer: 59 | Admitting: Family Medicine

## 2016-07-02 ENCOUNTER — Encounter (INDEPENDENT_AMBULATORY_CARE_PROVIDER_SITE_OTHER): Payer: 59 | Admitting: Family Medicine

## 2016-07-03 MED FILL — LO LOESTRIN FE 1-10 TABLET: 1 MG-10 MCG | 84 days supply | Qty: 84 | Fill #0

## 2016-07-17 ENCOUNTER — Ambulatory Visit
Admission: RE | Admit: 2016-07-17 | Discharge: 2016-07-17 | Disposition: A | Discharge status: Home / Self Care | Payer: 59 | Source: Ambulatory Visit | Attending: Obstetrics and Gynecology | Admitting: Obstetrics and Gynecology

## 2016-07-17 DIAGNOSIS — Z1231 Encounter for screening mammogram for malignant neoplasm of breast: Secondary | ICD-10-CM | POA: Diagnosis not present

## 2016-07-31 MED FILL — predniSONE 10 MG TABS: 10 | 6 days supply | Qty: 21 | Fill #0

## 2016-08-05 ENCOUNTER — Ambulatory Visit (INDEPENDENT_AMBULATORY_CARE_PROVIDER_SITE_OTHER): Payer: 59 | Admitting: Family Medicine

## 2016-08-13 ENCOUNTER — Encounter (INDEPENDENT_AMBULATORY_CARE_PROVIDER_SITE_OTHER): Payer: Self-pay | Admitting: Family Medicine

## 2016-08-13 ENCOUNTER — Ambulatory Visit (INDEPENDENT_AMBULATORY_CARE_PROVIDER_SITE_OTHER): Payer: 59 | Admitting: Family Medicine

## 2016-08-13 VITALS — BP 130/90 | HR 81 | Temp 98.1°F | Resp 12 | Ht 66.0 in | Wt 188.0 lb

## 2016-08-13 DIAGNOSIS — Z1389 Encounter for screening for other disorder: Secondary | ICD-10-CM | POA: Diagnosis not present

## 2016-08-13 DIAGNOSIS — R5383 Other fatigue: Secondary | ICD-10-CM | POA: Diagnosis not present

## 2016-08-13 DIAGNOSIS — E559 Vitamin D deficiency, unspecified: Secondary | ICD-10-CM | POA: Diagnosis not present

## 2016-08-13 DIAGNOSIS — Z6830 Body mass index (BMI) 30.0-30.9, adult: Secondary | ICD-10-CM | POA: Diagnosis not present

## 2016-08-13 DIAGNOSIS — Z0289 Encounter for other administrative examinations: Secondary | ICD-10-CM

## 2016-08-13 DIAGNOSIS — R0602 Shortness of breath: Secondary | ICD-10-CM

## 2016-08-13 DIAGNOSIS — E669 Obesity, unspecified: Secondary | ICD-10-CM | POA: Diagnosis not present

## 2016-08-13 DIAGNOSIS — Z9189 Other specified personal risk factors, not elsewhere classified: Secondary | ICD-10-CM | POA: Diagnosis not present

## 2016-08-13 DIAGNOSIS — Z1331 Encounter for screening for depression: Secondary | ICD-10-CM

## 2016-08-13 DIAGNOSIS — I10 Essential (primary) hypertension: Secondary | ICD-10-CM | POA: Diagnosis not present

## 2016-08-13 MED FILL — SUMATRIPTAN SUCC 100 MG TAB: 100 | 90 days supply | Qty: 27 | Fill #0

## 2016-08-13 NOTE — Progress Notes (Signed)
Office: [phone]  /  Fax: [phone]   HPI:   Chief Complaint: OBESITY  [name] (MR# [id number]) is a 46 y.o. female who presents on 08/13/2016 for obesity evaluation and treatment. Current BMI is Body mass index is 30.34 kg/m.Marland Kitchen [name] has struggled with obesity for years and has been unsuccessful in either losing weight or maintaining long term weight loss. [name] attended our information session and states she is currently in the action stage of change and ready to dedicate time achieving and maintaining a healthier weight.  Caliana states her family eats meals together sometimes her desired weight is 150 she started gaining weight after she got married her heaviest weight ever was 196 lbs. she has significant food cravings issues  she snacks frequently in the evenings she frequently makes poor food choices she has problems with excessive hunger  she frequently eats larger portions than normal  she has binge eating behaviors she struggles with emotional eating    Fatigue [name] feels her energy is lower than it should be. This has worsened with weight gain and has not worsened recently. [name] admits to daytime somnolence and  admits to waking up still tired. Patient is at risk for obstructive sleep apnea. Patent has a history of symptoms of daytime fatigue, morning headache and hypertension. Patient generally gets 6 hours of sleep per night, and states they generally have generally restless sleep. Snoring is present. Apneic episodes are not present. Epworth Sleepiness Score is 5.  Dyspnea on exertion [name] notes increasing shortness of breath with exercising and seems to be worsening over time with weight gain. She notes getting out of breath sooner with activity than she used to. This has not gotten worse recently. [name] denies orthopnea.  Vitamin D deficiency [name] has a diagnosis of vitamin D deficiency. She is not currently taking vit D and denies nausea, vomiting or muscle  weakness.  Hypertension [name] is a 46 y.o. female with hypertension.  [name] denies chest pain or shortness of breath on exertion. She is working weight loss to help control her blood pressure with the goal of decreasing her risk of heart attack and stroke. [name] blood pressure is not currently controlled.  At risk for cardiovascular disease [name] is at a higher than average risk for cardiovascular disease due to obesity. She currently denies any chest pain.  Depression Screen [name]'s Food and Mood (modified PHQ-9) score was  Depression screen PHQ 2/9 08/13/2016  Decreased Interest 1  Down, Depressed, Hopeless 2  PHQ - 2 Score 3  Altered sleeping 1  Tired, decreased energy 3  Change in appetite 3  Feeling bad or failure about yourself  1  Trouble concentrating 1  Moving slowly or fidgety/restless 1  Suicidal thoughts 0  PHQ-9 Score 13    ALLERGIES: No Known Allergies  MEDICATIONS: Current Outpatient Prescriptions on File Prior to Visit  Medication Sig Dispense Refill  . magnesium oxide (MAG-OX) 400 MG tablet Take 400 mg by mouth daily.    . SUMAtriptan (IMITREX) 50 MG tablet Take 50 mg by mouth every 2 (two) hours as needed for migraine. May repeat in 2 hours if headache persists or recurs.    . valsartan (DIOVAN) 160 MG tablet Take 160 mg by mouth daily.     No current facility-administered medications on file prior to visit.     PAST MEDICAL HISTORY: Past Medical History:  Diagnosis Date  . Anemia   . Asthma    as child  .  Back pain   . GERD (gastroesophageal reflux disease)   . Headache   . Hypertension   . Joint pain   . Migraines   . Parathyroid abnormality ([place])   . Swelling    feet and legs  . Vitamin D deficiency     PAST SURGICAL HISTORY: Past Surgical History:  Procedure Laterality Date  . FOOT SURGERY Bilateral 2001  . PARATHYROIDECTOMY N/A 01/03/2016   Procedure: PARATHYROIDECTOMY;  Surgeon: Leta Baptist, MD;  Location: [facility];   Service: ENT;  Laterality: N/A;  . TUBAL LIGATION      SOCIAL HISTORY: Social History  Substance Use Topics  . Smoking status: Never Smoker  . Smokeless tobacco: Never Used  . Alcohol use No    FAMILY HISTORY: Family History  Problem Relation Age of Onset  . Hypertension Father   . Cancer Maternal Grandmother   . Heart disease Paternal Grandmother   . Cancer Paternal Grandmother     ROS: Review of Systems  Constitutional: Positive for malaise/fatigue.  Respiratory: Positive for shortness of breath.   Cardiovascular: Negative for orthopnea.  Gastrointestinal: Negative for nausea and vomiting.  Musculoskeletal:       Negative Muscle Weakness  Neurological: Positive for headaches.  Psychiatric/Behavioral: Positive for depression.    PHYSICAL EXAM: Blood pressure 130/90, pulse 81, temperature 98.1 F (36.7 C), temperature source Oral, resp. rate 12, height 5\' 6"  (1.676 m), weight 188 lb (85.3 kg), last menstrual period 08/13/2016, SpO2 95 %. Body mass index is 30.34 kg/m. Physical Exam  Constitutional: She is oriented to person, place, and time. She appears well-developed and well-nourished.  Cardiovascular: Normal rate.   Pulmonary/Chest: Effort normal.  Musculoskeletal: Normal range of motion.  Neurological: She is oriented to person, place, and time.  Skin: Skin is warm and dry.  Psychiatric: She has a normal mood and affect. Her behavior is normal.  Vitals reviewed.   RECENT LABS AND TESTS: BMET    Component Value Date/Time   CALCIUM 7.5 (L) 01/03/2016 1520   No results found for: HGBA1C No results found for: INSULIN CBC No results found for: WBC, RBC, HGB, HCT, PLT, MCV, MCH, MCHC, RDW, LYMPHSABS, MONOABS, EOSABS, BASOSABS Iron/TIBC/Ferritin/ %Sat No results found for: IRON, TIBC, FERRITIN, IRONPCTSAT Lipid Panel  No results found for: CHOL, TRIG, HDL, CHOLHDL, VLDL, LDLCALC, LDLDIRECT Hepatic Function Panel  No results found for: PROT, ALBUMIN, AST,  ALT, ALKPHOS, BILITOT, BILIDIR, IBILI No results found for: TSH  ECG  shows NSR with a rate of 76 BPM INDIRECT CALORIMETER done today shows a VO2 of 221 and a REE of 1540.    ASSESSMENT AND PLAN: Other fatigue - Plan: EKG 12-Lead, Vitamin B12, CBC With Differential, Comprehensive metabolic panel, Hemoglobin A1c, Insulin, random, Lipid Panel With LDL/HDL Ratio, T3, T4, free, TSH, Folate  Shortness of breath on exertion  Essential hypertension  Vitamin D deficiency - Plan: VITAMIN D 25 Hydroxy (Vit-D Deficiency, Fractures)  Depression screening  At risk for heart disease  Class 1 obesity without serious comorbidity with body mass index (BMI) of 30.0 to 30.9 in adult, unspecified obesity type  PLAN:  Fatigue [name] was informed that her fatigue may be related to obesity, depression or many other causes. Labs will be ordered, and in the meanwhile Karliah has agreed to work on diet, exercise and weight loss to help with fatigue. Proper sleep hygiene was discussed including the need for 7-8 hours of quality sleep each night. A sleep study was not ordered based  on symptoms and Epworth score.  Dyspnea on exertion Damira's shortness of breath appears to be obesity related and exercise induced. She has agreed to work on weight loss and gradually increase exercise to treat her exercise induced shortness of breath. If [name] follows our instructions and loses weight without improvement of her shortness of breath, we will plan to refer to pulmonology. We will monitor this condition regularly. [name] agrees to this plan.  Vitamin D Deficiency [name] was informed that low vitamin D levels contributes to fatigue and are associated with obesity, breast, and colon cancer. She agrees to start to take prescription Vit D @50 ,000 IU every week, #4 with no refills and will re-check labs in 2 months and will follow up for routine testing of vitamin D, at least 2-3 times per year. She was informed of the risk of  over-replacement of vitamin D and agrees to not increase her dose unless he discusses this with [name] first.  Hypertension  [name] has hypertension that is not controlled on [place]. We discussed sodium restriction, working on healthy weight loss, and a regular exercise program as the means to achieve improved blood pressure control. [name] agreed with this plan and agreed to follow up as directed. We will continue to monitor her blood pressure as well as her progress with the above lifestyle modifications. She will continue her medications as prescribed and will watch for signs of hypotension as she continues her lifestyle modifications.  Cardiovascular risk counselling [name] was given extended (at least 15 minutes) coronary artery disease prevention counseling today. She is 46 y.o. female and has risk factors for heart disease including obesity. We discussed intensive lifestyle modifications today with an emphasis on specific weight loss instructions and strategies. Pt was also informed of the importance of increasing exercise and decreasing saturated fats to help prevent heart disease.  Depression Screen [name] had a positive depression screening. Depression is commonly associated with obesity and often results in emotional eating behaviors. We will monitor this closely and work on CBT to help improve the non-hunger eating patterns. Referral to Psychology may be required if no improvement is seen as she continues in our clinic.  Obesity [name] is currently in the action stage of change and her goal is to continue with weight loss efforts She has agreed to follow the Category 2 plan [name] has been instructed to work up to a goal of 150 minutes of combined cardio and strengthening exercise per week for weight loss and overall health benefits. We discussed the following Behavioral Modification Stratagies today: increasing lean protein intake, decreasing simple carbohydrates , increasing vegetables and increasing  lower sugar fruits  [name] has agreed to follow up with our clinic in 2 weeks. She was informed of the importance of frequent follow up visits to maximize her success with intensive lifestyle modifications for her multiple health conditions. She was informed we would discuss her lab results at her next visit unless there is a critical issue that needs to be addressed sooner. Sammijo agreed to keep her next visit at the agreed upon time to discuss these results.  I, Doreene Nest, am acting as scribe for Dennard Nip, MD  I have reviewed the above documentation for accuracy and completeness, and I agree with the above. -Dennard Nip, MD

## 2016-08-14 LAB — T4, FREE: Free T4: 0.93 ng/dL (ref 0.82–1.77)

## 2016-08-14 LAB — INSULIN, RANDOM: INSULIN: 7.9 u[IU]/mL (ref 2.6–24.9)

## 2016-08-14 LAB — CBC WITH DIFFERENTIAL
Basophils Absolute: 0 10*3/uL (ref 0.0–0.2)
Basos: 1 %
EOS (ABSOLUTE): 0.1 10*3/uL (ref 0.0–0.4)
EOS: 1 %
HEMATOCRIT: 40.8 % (ref 34.0–46.6)
HEMOGLOBIN: 12.4 g/dL (ref 11.1–15.9)
IMMATURE GRANULOCYTES: 0 %
Immature Grans (Abs): 0 10*3/uL (ref 0.0–0.1)
Lymphocytes Absolute: 2.4 10*3/uL (ref 0.7–3.1)
Lymphs: 33 %
MCH: 27.9 pg (ref 26.6–33.0)
MCHC: 30.4 g/dL — ABNORMAL LOW (ref 31.5–35.7)
MCV: 92 fL (ref 79–97)
MONOCYTES: 8 %
Monocytes Absolute: 0.6 10*3/uL (ref 0.1–0.9)
Neutrophils Absolute: 4.1 10*3/uL (ref 1.4–7.0)
Neutrophils: 57 %
RBC: 4.45 x10E6/uL (ref 3.77–5.28)
RDW: 13.3 % (ref 12.3–15.4)
WBC: 7.2 10*3/uL (ref 3.4–10.8)

## 2016-08-14 LAB — COMPREHENSIVE METABOLIC PANEL
ALBUMIN: 4.1 g/dL (ref 3.5–5.5)
ALT: 13 IU/L (ref 0–32)
AST: 16 IU/L (ref 0–40)
Albumin/Globulin Ratio: 1.5 (ref 1.2–2.2)
Alkaline Phosphatase: 53 IU/L (ref 39–117)
BUN/Creatinine Ratio: 18 (ref 9–23)
BUN: 15 mg/dL (ref 6–24)
Bilirubin Total: 0.3 mg/dL (ref 0.0–1.2)
CALCIUM: 9.1 mg/dL (ref 8.7–10.2)
CO2: 25 mmol/L (ref 18–29)
CREATININE: 0.84 mg/dL (ref 0.57–1.00)
Chloride: 101 mmol/L (ref 96–106)
GFR calc Af Amer: 96 mL/min/{1.73_m2} (ref >59)
GFR, EST NON AFRICAN AMERICAN: 84 mL/min/{1.73_m2} (ref >59)
GLOBULIN, TOTAL: 2.7 g/dL (ref 1.5–4.5)
Glucose: 78 mg/dL (ref 65–99)
Potassium: 4.9 mmol/L (ref 3.5–5.2)
SODIUM: 139 mmol/L (ref 134–144)
Total Protein: 6.8 g/dL (ref 6.0–8.5)

## 2016-08-14 LAB — LIPID PANEL WITH LDL/HDL RATIO
Cholesterol, Total: 194 mg/dL (ref 100–199)
HDL: 51 mg/dL (ref >39)
LDL Calculated: 115 mg/dL — ABNORMAL HIGH (ref 0–99)
LDL/HDL RATIO: 2.3 ratio (ref 0.0–3.2)
TRIGLYCERIDES: 142 mg/dL (ref 0–149)
VLDL Cholesterol Cal: 28 mg/dL (ref 5–40)

## 2016-08-14 LAB — HEMOGLOBIN A1C
ESTIMATED AVERAGE GLUCOSE: 111 mg/dL
Hgb A1c MFr Bld: 5.5 % (ref 4.8–5.6)

## 2016-08-14 LAB — VITAMIN D 25 HYDROXY (VIT D DEFICIENCY, FRACTURES): VIT D 25 HYDROXY: 29.1 ng/mL — AB (ref 30.0–100.0)

## 2016-08-14 LAB — TSH: TSH: 2.08 u[IU]/mL (ref 0.450–4.500)

## 2016-08-14 LAB — T3: T3 TOTAL: 120 ng/dL (ref 71–180)

## 2016-08-14 LAB — VITAMIN B12: VITAMIN B 12: 453 pg/mL (ref 232–1245)

## 2016-08-14 LAB — FOLATE

## 2016-08-31 ENCOUNTER — Ambulatory Visit (INDEPENDENT_AMBULATORY_CARE_PROVIDER_SITE_OTHER): Payer: 59 | Admitting: Family Medicine

## 2016-08-31 VITALS — BP 109/74 | HR 74 | Temp 98.0°F | Ht 66.0 in | Wt 182.0 lb

## 2016-08-31 DIAGNOSIS — Z6830 Body mass index (BMI) 30.0-30.9, adult: Secondary | ICD-10-CM | POA: Diagnosis not present

## 2016-08-31 DIAGNOSIS — E8881 Metabolic syndrome: Secondary | ICD-10-CM

## 2016-08-31 DIAGNOSIS — Z9189 Other specified personal risk factors, not elsewhere classified: Secondary | ICD-10-CM

## 2016-08-31 DIAGNOSIS — E559 Vitamin D deficiency, unspecified: Secondary | ICD-10-CM

## 2016-08-31 DIAGNOSIS — E785 Hyperlipidemia, unspecified: Secondary | ICD-10-CM | POA: Diagnosis not present

## 2016-08-31 DIAGNOSIS — E66811 Obesity, class 1: Secondary | ICD-10-CM

## 2016-08-31 DIAGNOSIS — E669 Obesity, unspecified: Secondary | ICD-10-CM

## 2016-08-31 HISTORY — DX: Obesity, unspecified: E66.9

## 2016-08-31 HISTORY — DX: Obesity, class 1: E66.811

## 2016-08-31 MED ORDER — VITAMIN D (ERGOCALCIFEROL) 1.25 MG (50000 UNIT) PO CAPS: 50000.0000 [IU] | ORAL_CAPSULE | ORAL | 0 refills | Status: DC

## 2016-08-31 MED FILL — VIT D2 1.25 MG (50,000 UNIT: 1.25 MG | 28 days supply | Qty: 4 | Fill #0

## 2016-08-31 NOTE — Progress Notes (Signed)
Office: [phone]  /  Fax: [phone]   HPI:   Chief Complaint: OBESITY [name] is here to discuss her progress with her obesity treatment plan. She is following her eating plan approximately 100 % of the time and states she is exercising 0 minutes 0 times per week. [name] has done very well with weight loss. She has done better when she switched lunch and supper around. Her weight is 182 lb (82.6 kg) today and has had a weight loss of 6 pounds over a period of 2 to 3 weeks since her last visit. She has lost 6 lbs since starting treatment with [name].  Vitamin D deficiency [name] has a diagnosis of vitamin D deficiency. She has been on prescription vitamin D 1 time a month in addition to multivitamin and is not yet at goal. She admits fatigue and denies nausea, vomiting or muscle weakness.  Insulin Resistance [name] has a new diagnosis of insulin resistance based on her slightly elevated fasting insulin level at 8. Although [name]'s blood glucose readings and Hgb A1c are very good and are still under good control, insulin resistance puts her at greater risk of metabolic syndrome and diabetes. She has a history of diabetes in grandparents. She notes feeling hypoglycemic (weak, shaky) between meals. She is not taking metformin currently and continues to work on diet and exercise to decrease risk of diabetes.  At risk for diabetes [name] is at higher than average risk for developing diabetes due to her obesity. She currently denies polyuria or polydipsia.  Hyperlipidemia [name] has hyperlipidemia and has been trying to improve her cholesterol levels with intensive lifestyle modification including a low saturated fat diet, exercise and weight loss. She denies any chest pain, claudication or myalgias.  Wt Readings from Last 500 Encounters:  08/31/16 182 lb (82.6 kg)  08/13/16 188 lb (85.3 kg)  01/03/16 191 lb 2 oz (86.7 kg)     ALLERGIES: No Known Allergies  MEDICATIONS: Current Outpatient  Prescriptions on File Prior to Visit  Medication Sig Dispense Refill  . magnesium oxide (MAG-OX) 400 MG tablet Take 400 mg by mouth daily.    . Multiple Vitamin (MULTIVITAMIN) tablet Take 1 tablet by mouth daily.    . SUMAtriptan (IMITREX) 50 MG tablet Take 50 mg by mouth every 2 (two) hours as needed for migraine. May repeat in 2 hours if headache persists or recurs.    . valsartan (DIOVAN) 160 MG tablet Take 160 mg by mouth daily.    . Vitamin D, Ergocalciferol, (DRISDOL) 50000 units CAPS capsule Take 50,000 Units by mouth every 7 (seven) days.     No current facility-administered medications on file prior to visit.     PAST MEDICAL HISTORY: Past Medical History:  Diagnosis Date  . Anemia   . Asthma    as child  . Back pain   . GERD (gastroesophageal reflux disease)   . Headache   . Hypertension   . Joint pain   . Migraines   . Parathyroid abnormality ([place])   . Swelling    feet and legs  . Vitamin D deficiency     PAST SURGICAL HISTORY: Past Surgical History:  Procedure Laterality Date  . FOOT SURGERY Bilateral 2001  . PARATHYROIDECTOMY N/A 01/03/2016   Procedure: PARATHYROIDECTOMY;  Surgeon: Leta Baptist, MD;  Location: [facility];  Service: ENT;  Laterality: N/A;  . TUBAL LIGATION      SOCIAL HISTORY: Social History  Substance Use Topics  . Smoking status: Never  Smoker  . Smokeless tobacco: Never Used  . Alcohol use No    FAMILY HISTORY: Family History  Problem Relation Age of Onset  . Hypertension Father   . Cancer Maternal Grandmother   . Heart disease Paternal Grandmother   . Cancer Paternal Grandmother     ROS: Review of Systems  Constitutional: Positive for malaise/fatigue and weight loss.  Cardiovascular: Negative for chest pain and claudication.  Gastrointestinal: Negative for nausea and vomiting.  Genitourinary: Negative for frequency.  Musculoskeletal: Negative for myalgias.       Negative muscle weakness  Endo/Heme/Allergies:  Negative for polydipsia.       Hypoglycemia    PHYSICAL EXAM: Blood pressure 109/74, pulse 74, temperature 98 F (36.7 C), temperature source Oral, height 5\' 6"  (1.676 m), weight 182 lb (82.6 kg), last menstrual period 08/13/2016, SpO2 98 %. Body mass index is 29.38 kg/m. Physical Exam  Constitutional: She is oriented to person, place, and time. She appears well-developed and well-nourished.  Cardiovascular: Normal rate.   Pulmonary/Chest: Effort normal.  Musculoskeletal: Normal range of motion.  Neurological: She is oriented to person, place, and time.  Skin: Skin is warm and dry.  Psychiatric: She has a normal mood and affect. Her behavior is normal.  Vitals reviewed.   RECENT LABS AND TESTS: BMET    Component Value Date/Time   NA 139 08/13/2016 0928   K 4.9 08/13/2016 0928   CL 101 08/13/2016 0928   CO2 25 08/13/2016 0928   GLUCOSE 78 08/13/2016 0928   BUN 15 08/13/2016 0928   CREATININE 0.84 08/13/2016 0928   CALCIUM 9.1 08/13/2016 0928   CALCIUM 7.5 (L) 01/03/2016 1520   GFRNONAA 84 08/13/2016 0928   GFRAA 96 08/13/2016 0928   Lab Results  Component Value Date   HGBA1C 5.5 08/13/2016   Lab Results  Component Value Date   INSULIN 7.9 08/13/2016   CBC    Component Value Date/Time   WBC 7.2 08/13/2016 0928   RBC 4.45 08/13/2016 0928   HCT 40.8 08/13/2016 0928   MCV 92 08/13/2016 0928   MCH 27.9 08/13/2016 0928   MCHC 30.4 (L) 08/13/2016 0928   RDW 13.3 08/13/2016 0928   LYMPHSABS 2.4 08/13/2016 0928   EOSABS 0.1 08/13/2016 0928   BASOSABS 0.0 08/13/2016 0928   Iron/TIBC/Ferritin/ %Sat No results found for: IRON, TIBC, FERRITIN, IRONPCTSAT Lipid Panel     Component Value Date/Time   CHOL 194 08/13/2016 0928   TRIG 142 08/13/2016 0928   HDL 51 08/13/2016 0928   LDLCALC 115 (H) 08/13/2016 0928   Hepatic Function Panel     Component Value Date/Time   PROT 6.8 08/13/2016 0928   ALBUMIN 4.1 08/13/2016 0928   AST 16 08/13/2016 0928   ALT 13  08/13/2016 0928   ALKPHOS 53 08/13/2016 0928   BILITOT 0.3 08/13/2016 0928      Component Value Date/Time   TSH 2.080 08/13/2016 0928    ASSESSMENT AND PLAN: Vitamin D deficiency - Plan: Vitamin D, Ergocalciferol, (DRISDOL) 50000 units CAPS capsule  Hyperlipidemia, unspecified hyperlipidemia type  Insulin resistance  At risk for diabetes mellitus  Class 1 obesity without serious comorbidity with body mass index (BMI) of 30.0 to 30.9 in adult, unspecified obesity type - Starting BMI greater then 30  PLAN:  Vitamin D Deficiency [name] was informed that low vitamin D levels contributes to fatigue and are associated with obesity, breast, and colon cancer. She agrees to start to take prescription Vit D @50 ,000 IU every  week #4 with no refills and we will re-check labs in 3 months and will follow up for routine testing of vitamin D, at least 2-3 times per year. She was informed of the risk of over-replacement of vitamin D and agrees to not increase her dose unless he discusses this with [name] first.  Insulin Resistance [name] will continue to work on weight loss, exercise, and decreasing simple carbohydrates in her diet to help decrease the risk of diabetes. We dicussed metformin including benefits and risks. She was informed that eating too many simple carbohydrates or too many calories at one sitting increases the likelihood of GI side effects. [name] declined metformin for now and prescription was not written today. We will re-check labs in 3 months and [name] agreed to follow up with [name] as directed to monitor her progress.  Diabetes risk counselling [name] was given extended (at least 15 minutes) diabetes prevention counseling today. She is 46 y.o. female and has risk factors for diabetes including obesity. We discussed intensive lifestyle modifications today with an emphasis on weight loss as well as increasing exercise and decreasing simple carbohydrates in her diet.  Hyperlipidemia [name] was  informed of the American Heart Association Guidelines emphasizing intensive lifestyle modifications as the first line treatment for hyperlipidemia. We discussed many lifestyle modifications today in depth, and [name] will continue to work on decreasing saturated fats such as fatty red meat, butter and many fried foods. She will also increase vegetables and lean protein in her diet and continue to work on exercise and weight loss efforts.  Obesity [name] is currently in the action stage of change. As such, her goal is to continue with weight loss efforts She has agreed to follow the Category 2 plan [name] has been instructed to work up to a goal of 150 minutes of combined cardio and strengthening exercise per week or start to monitor steps for weight loss and overall health benefits. We discussed the following Behavioral Modification Stratagies today: increasing lean protein intake and dealing with family or coworker sabotage  [name] has agreed to follow up with our clinic in 2 weeks. She was informed of the importance of frequent follow up visits to maximize her success with intensive lifestyle modifications for her multiple health conditions.  I, Doreene Nest, am acting as scribe for Dennard Nip, MD  I have reviewed the above documentation for accuracy and completeness, and I agree with the above. -Dennard Nip, MD

## 2016-09-15 ENCOUNTER — Ambulatory Visit (INDEPENDENT_AMBULATORY_CARE_PROVIDER_SITE_OTHER): Payer: 59 | Admitting: Family Medicine

## 2016-09-15 VITALS — BP 119/75 | HR 69 | Temp 98.2°F | Ht 66.0 in | Wt 181.0 lb

## 2016-09-15 DIAGNOSIS — R51 Headache: Secondary | ICD-10-CM | POA: Diagnosis not present

## 2016-09-15 DIAGNOSIS — Z6830 Body mass index (BMI) 30.0-30.9, adult: Secondary | ICD-10-CM

## 2016-09-15 DIAGNOSIS — R519 Headache, unspecified: Secondary | ICD-10-CM

## 2016-09-15 DIAGNOSIS — E669 Obesity, unspecified: Secondary | ICD-10-CM | POA: Diagnosis not present

## 2016-09-15 MED ORDER — IBUPROFEN 400 MG PO TABS: 400.0000 mg | ORAL_TABLET | Freq: Three times a day (TID) | ORAL | 0 refills | Status: DC

## 2016-09-16 NOTE — Progress Notes (Signed)
Office: [phone]  /  Fax: [phone]   HPI:   Chief Complaint: OBESITY [name] is here to discuss her progress with her obesity treatment plan. She is following her eating plan approximately 100 % of the time and states she is walking approximately 10,000 steps 5 times per week. [name] continues to do well with weight loss but is struggling to eat all her food. She is walking approximately 10,000 steps, 5 times per week. She is getting bored with her plan and would like to discuss options with less meat. Her weight is 181 lb (82.1 kg) today and has had a weight loss of 1 pound over a period of 2 weeks since her last visit. She has lost 7 lbs since starting treatment with [name].  Headache [name] has a history of migraines but has noticed a headache this weekend that was bilateral frontal and pounding, not her normal migraine. She helped a friend move and maybe didn't drink as much water. Headache is now resolved.  Wt Readings from Last 500 Encounters:  09/15/16 181 lb (82.1 kg)  08/31/16 182 lb (82.6 kg)  08/13/16 188 lb (85.3 kg)  01/03/16 191 lb 2 oz (86.7 kg)     ALLERGIES: No Known Allergies  MEDICATIONS: Current Outpatient Prescriptions on File Prior to Visit  Medication Sig Dispense Refill  . magnesium oxide (MAG-OX) 400 MG tablet Take 400 mg by mouth daily.    . Multiple Vitamin (MULTIVITAMIN) tablet Take 1 tablet by mouth daily.    . SUMAtriptan (IMITREX) 50 MG tablet Take 50 mg by mouth every 2 (two) hours as needed for migraine. May repeat in 2 hours if headache persists or recurs.    . valsartan (DIOVAN) 160 MG tablet Take 160 mg by mouth daily.    . Vitamin D, Ergocalciferol, (DRISDOL) 50000 units CAPS capsule Take 1 capsule (50,000 Units total) by mouth every 7 (seven) days. 4 capsule 0   No current facility-administered medications on file prior to visit.     PAST MEDICAL HISTORY: Past Medical History:  Diagnosis Date  . Anemia   . Asthma    as child  . Back  pain   . GERD (gastroesophageal reflux disease)   . Headache   . Hypertension   . Joint pain   . Migraines   . Parathyroid abnormality ([place])   . Swelling    feet and legs  . Vitamin D deficiency     PAST SURGICAL HISTORY: Past Surgical History:  Procedure Laterality Date  . FOOT SURGERY Bilateral 2001  . PARATHYROIDECTOMY N/A 01/03/2016   Procedure: PARATHYROIDECTOMY;  Surgeon: Leta Baptist, MD;  Location: [place];  Service: ENT;  Laterality: N/A;  . TUBAL LIGATION      SOCIAL HISTORY: Social History  Substance Use Topics  . Smoking status: Never Smoker  . Smokeless tobacco: Never Used  . Alcohol use No    FAMILY HISTORY: Family History  Problem Relation Age of Onset  . Hypertension Father   . Cancer Maternal Grandmother   . Heart disease Paternal Grandmother   . Cancer Paternal Grandmother     ROS: Review of Systems  Constitutional: Positive for weight loss.  Neurological: Positive for headaches.    PHYSICAL EXAM: Blood pressure 119/75, pulse 69, temperature 98.2 F (36.8 C), temperature source Oral, height 5\' 6"  (1.676 m), weight 181 lb (82.1 kg), last menstrual period 09/07/2016, SpO2 98 %. Body mass index is 29.21 kg/m. Physical Exam  Constitutional: She is oriented to  person, place, and time. She appears well-developed and well-nourished.  Cardiovascular: Normal rate.   Pulmonary/Chest: Effort normal.  Musculoskeletal: Normal range of motion.  Neurological: She is oriented to person, place, and time.  Skin: Skin is warm and dry.  Psychiatric: She has a normal mood and affect. Her behavior is normal.  Vitals reviewed.   RECENT LABS AND TESTS: BMET    Component Value Date/Time   NA 139 08/13/2016 0928   K 4.9 08/13/2016 0928   CL 101 08/13/2016 0928   CO2 25 08/13/2016 0928   GLUCOSE 78 08/13/2016 0928   BUN 15 08/13/2016 0928   CREATININE 0.84 08/13/2016 0928   CALCIUM 9.1 08/13/2016 0928   CALCIUM 7.5 (L) 01/03/2016 1520    GFRNONAA 84 08/13/2016 0928   GFRAA 96 08/13/2016 0928   Lab Results  Component Value Date   HGBA1C 5.5 08/13/2016   Lab Results  Component Value Date   INSULIN 7.9 08/13/2016   CBC    Component Value Date/Time   WBC 7.2 08/13/2016 0928   RBC 4.45 08/13/2016 0928   HCT 40.8 08/13/2016 0928   MCV 92 08/13/2016 0928   MCH 27.9 08/13/2016 0928   MCHC 30.4 (L) 08/13/2016 0928   RDW 13.3 08/13/2016 0928   LYMPHSABS 2.4 08/13/2016 0928   EOSABS 0.1 08/13/2016 0928   BASOSABS 0.0 08/13/2016 0928   Iron/TIBC/Ferritin/ %Sat No results found for: IRON, TIBC, FERRITIN, IRONPCTSAT Lipid Panel     Component Value Date/Time   CHOL 194 08/13/2016 0928   TRIG 142 08/13/2016 0928   HDL 51 08/13/2016 0928   LDLCALC 115 (H) 08/13/2016 0928   Hepatic Function Panel     Component Value Date/Time   PROT 6.8 08/13/2016 0928   ALBUMIN 4.1 08/13/2016 0928   AST 16 08/13/2016 0928   ALT 13 08/13/2016 0928   ALKPHOS 53 08/13/2016 0928   BILITOT 0.3 08/13/2016 0928      Component Value Date/Time   TSH 2.080 08/13/2016 0928    ASSESSMENT AND PLAN: Acute nonintractable headache, unspecified headache type - Plan: ibuprofen (ADVIL,MOTRIN) 400 MG tablet  Class 1 obesity without serious comorbidity with body mass index (BMI) of 30.0 to 30.9 in adult, unspecified obesity type - Starting BMI greater then 30  PLAN:  Headache [name] advised to increase water to 80+ ounces per day and will monitor. She agrees to take OTC Ibuprofen 400 mg three times per day as needed. [name] will follow up in our clinic in 2 weeks.  Obesity [name] is currently in the action stage of change. As such, her goal is to continue with weight loss efforts She has agreed to change to follow our protein rich vegetarian plan [name] has been instructed to work up to a goal of 150 minutes of combined cardio and strengthening exercise per week for weight loss and overall health benefits. We discussed the following Behavioral  Modification Stratagies today: increasing lower sugar fruits, work on meal planning and easy cooking plans, decrease liquid calories and increase water to 80+ ounces per day.  [name] has agreed to follow up with our clinic in 2 weeks. She was informed of the importance of frequent follow up visits to maximize her success with intensive lifestyle modifications for her multiple health conditions.  I, Doreene Nest, am acting as scribe for Dennard Nip, MD  I have reviewed the above documentation for accuracy and completeness, and I agree with the above. -Dennard Nip, MD

## 2016-09-29 ENCOUNTER — Ambulatory Visit (INDEPENDENT_AMBULATORY_CARE_PROVIDER_SITE_OTHER): Payer: 59 | Admitting: Family Medicine

## 2016-09-29 VITALS — BP 124/82 | HR 75 | Temp 98.2°F | Ht 66.0 in | Wt 180.0 lb

## 2016-09-29 DIAGNOSIS — E8881 Metabolic syndrome: Secondary | ICD-10-CM

## 2016-09-29 DIAGNOSIS — E669 Obesity, unspecified: Secondary | ICD-10-CM | POA: Diagnosis not present

## 2016-09-29 DIAGNOSIS — E559 Vitamin D deficiency, unspecified: Secondary | ICD-10-CM

## 2016-09-29 MED ORDER — VITAMIN D (ERGOCALCIFEROL) 1.25 MG (50000 UNIT) PO CAPS: 50000.0000 [IU] | ORAL_CAPSULE | ORAL | 0 refills | Status: DC

## 2016-09-29 NOTE — Progress Notes (Signed)
Office: [phone]  /  Fax: [phone]   HPI:   Chief Complaint: OBESITY [name] is here to discuss her progress with her obesity treatment plan. She is following her eating plan approximately 95 % of the time and states she is exercising 0 minutes 0 times per week. [name] is doing well with weight loss but deviating from plan more. She would like to have more options. Her weight is 180 lb (81.6 kg) today and has had a weight loss of 1 pound over a period of 2 weeks since her last visit. She has lost 8 lbs since starting treatment with [name].  Vitamin D deficiency [name] has a diagnosis of vitamin D deficiency. She is currently stable on vit D, not yet at goal and denies nausea, vomiting or muscle weakness.  Insulin Resistance [name] has a diagnosis of insulin resistance based on her elevated fasting insulin level >5. Although Anna Lanes blood glucose readings are still under good control, insulin resistance puts her at greater risk of metabolic syndrome and diabetes. She is not taking metformin currently and is attempting to improve with diet and exercise to decrease risk of diabetes.  Wt Readings from Last 500 Encounters:  09/29/16 180 lb (81.6 kg)  09/15/16 181 lb (82.1 kg)  08/31/16 182 lb (82.6 kg)  08/13/16 188 lb (85.3 kg)  01/03/16 191 lb 2 oz (86.7 kg)     ALLERGIES: No Known Allergies  MEDICATIONS: Current Outpatient Prescriptions on File Prior to Visit  Medication Sig Dispense Refill  . ibuprofen (ADVIL,MOTRIN) 400 MG tablet Take 1 tablet (400 mg total) by mouth 3 (three) times daily. 30 tablet 0  . magnesium oxide (MAG-OX) 400 MG tablet Take 400 mg by mouth daily.    . Multiple Vitamin (MULTIVITAMIN) tablet Take 1 tablet by mouth daily.    . SUMAtriptan (IMITREX) 50 MG tablet Take 50 mg by mouth every 2 (two) hours as needed for migraine. May repeat in 2 hours if headache persists or recurs.    . valsartan (DIOVAN) 160 MG tablet Take 160 mg by mouth daily.    . Vitamin D,  Ergocalciferol, (DRISDOL) 50000 units CAPS capsule Take 1 capsule (50,000 Units total) by mouth every 7 (seven) days. 4 capsule 0   No current facility-administered medications on file prior to visit.     PAST MEDICAL HISTORY: Past Medical History:  Diagnosis Date  . Anemia   . Asthma    as child  . Back pain   . GERD (gastroesophageal reflux disease)   . Headache   . Hypertension   . Joint pain   . Migraines   . Parathyroid abnormality ([name])   . Swelling    feet and legs  . Vitamin D deficiency     PAST SURGICAL HISTORY: Past Surgical History:  Procedure Laterality Date  . FOOT SURGERY Bilateral 2001  . PARATHYROIDECTOMY N/A 01/03/2016   Procedure: PARATHYROIDECTOMY;  Surgeon: Leta Baptist, MD;  Location: [facility];  Service: ENT;  Laterality: N/A;  . TUBAL LIGATION      SOCIAL HISTORY: Social History  Substance Use Topics  . Smoking status: Never Smoker  . Smokeless tobacco: Never Used  . Alcohol use No    FAMILY HISTORY: Family History  Problem Relation Age of Onset  . Hypertension Father   . Cancer Maternal Grandmother   . Heart disease Paternal Grandmother   . Cancer Paternal Grandmother     ROS: Review of Systems  Constitutional: Positive for weight loss.  Gastrointestinal: Negative for nausea and vomiting.  Musculoskeletal:       Negative muscle weakness    PHYSICAL EXAM: Blood pressure 124/82, pulse 75, temperature 98.2 F (36.8 C), temperature source Oral, height 5\' 6"  (1.676 m), weight 180 lb (81.6 kg), last menstrual period 09/07/2016, SpO2 98 %. Body mass index is 29.05 kg/m. Physical Exam  Constitutional: She is oriented to person, place, and time. She appears well-developed and well-nourished.  Cardiovascular: Normal rate.   Pulmonary/Chest: Effort normal.  Musculoskeletal: Normal range of motion.  Neurological: She is oriented to person, place, and time.  Skin: Skin is warm and dry.  Psychiatric: She has a normal mood and  affect. Her behavior is normal.  Vitals reviewed.   RECENT LABS AND TESTS: BMET    Component Value Date/Time   NA 139 08/13/2016 0928   K 4.9 08/13/2016 0928   CL 101 08/13/2016 0928   CO2 25 08/13/2016 0928   GLUCOSE 78 08/13/2016 0928   BUN 15 08/13/2016 0928   CREATININE 0.84 08/13/2016 0928   CALCIUM 9.1 08/13/2016 0928   CALCIUM 7.5 (L) 01/03/2016 1520   GFRNONAA 84 08/13/2016 0928   GFRAA 96 08/13/2016 0928   Lab Results  Component Value Date   HGBA1C 5.5 08/13/2016   Lab Results  Component Value Date   INSULIN 7.9 08/13/2016   CBC    Component Value Date/Time   WBC 7.2 08/13/2016 0928   RBC 4.45 08/13/2016 0928   HCT 40.8 08/13/2016 0928   MCV 92 08/13/2016 0928   MCH 27.9 08/13/2016 0928   MCHC 30.4 (L) 08/13/2016 0928   RDW 13.3 08/13/2016 0928   LYMPHSABS 2.4 08/13/2016 0928   EOSABS 0.1 08/13/2016 0928   BASOSABS 0.0 08/13/2016 0928   Iron/TIBC/Ferritin/ %Sat No results found for: IRON, TIBC, FERRITIN, IRONPCTSAT Lipid Panel     Component Value Date/Time   CHOL 194 08/13/2016 0928   TRIG 142 08/13/2016 0928   HDL 51 08/13/2016 0928   LDLCALC 115 (H) 08/13/2016 0928   Hepatic Function Panel     Component Value Date/Time   PROT 6.8 08/13/2016 0928   ALBUMIN 4.1 08/13/2016 0928   AST 16 08/13/2016 0928   ALT 13 08/13/2016 0928   ALKPHOS 53 08/13/2016 0928   BILITOT 0.3 08/13/2016 0928      Component Value Date/Time   TSH 2.080 08/13/2016 0928    ASSESSMENT AND PLAN: Insulin resistance  Vitamin D deficiency - Plan: Vitamin D, Ergocalciferol, (DRISDOL) 50000 units CAPS capsule  Class 1 obesity without serious comorbidity with body mass index (BMI) of 30.0 to 30.9 in adult, unspecified obesity type - Current BMI 29.1 but greater then 30 when starting   PLAN:  Vitamin D Deficiency [name] was informed that low vitamin D levels contributes to fatigue and are associated with obesity, breast, and colon cancer. She agrees to continue to take  prescription Vit D @50 ,000 IU every week, we will refill for 1 month and will follow up for routine testing of vitamin D, at least 2-3 times per year. She was informed of the risk of over-replacement of vitamin D and agrees to not increase her dose unless he discusses this with [name] first. [name] agrees to follow up with our clinic in 2 weeks.  Insulin Resistance [name] will continue to work on weight loss, exercise, and decreasing simple carbohydrates in her diet to help decrease the risk of diabetes. She was informed that eating too many simple carbohydrates or too many calories at  one sitting increases the likelihood of GI side effects. [name] agreed to follow up with [name] as directed to monitor her progress.  We spent > than 50% of the 15 minute visit on the counseling as documented in the note.  Obesity [name] is currently in the action stage of change. As such, her goal is to continue with weight loss efforts She has agreed to keep a food journal with 1200 to 1300 calories and 75+ grams of protein daily and follow the Category 2 plan [name] has been instructed to work up to a goal of 150 minutes of combined cardio and strengthening exercise per week for weight loss and overall health benefits. We discussed the following Behavioral Modification Stratagies today: increasing lean protein intake, decreasing simple carbohydrates , increasing lower sugar fruits and work on meal planning and easy cooking plans  [name] has agreed to follow up with our clinic in 2 weeks. She was informed of the importance of frequent follow up visits to maximize her success with intensive lifestyle modifications for her multiple health conditions.  I, Doreene Nest, am acting as scribe for Dennard Nip, MD  I have reviewed the above documentation for accuracy and completeness, and I agree with the above. -Dennard Nip, MD

## 2016-09-30 MED FILL — VIT D2 1.25 MG (50,000 UNIT: 1.25 MG | 28 days supply | Qty: 4 | Fill #0

## 2016-09-30 MED FILL — VALSARTAN 320 MG TABLET: 320 | 90 days supply | Qty: 90 | Fill #1

## 2016-10-13 ENCOUNTER — Ambulatory Visit (INDEPENDENT_AMBULATORY_CARE_PROVIDER_SITE_OTHER): Payer: 59 | Admitting: Family Medicine

## 2016-10-13 VITALS — BP 102/72 | HR 76 | Temp 98.1°F | Ht 66.0 in | Wt 176.0 lb

## 2016-10-13 DIAGNOSIS — Z6830 Body mass index (BMI) 30.0-30.9, adult: Secondary | ICD-10-CM | POA: Diagnosis not present

## 2016-10-13 DIAGNOSIS — E669 Obesity, unspecified: Secondary | ICD-10-CM

## 2016-10-13 DIAGNOSIS — I10 Essential (primary) hypertension: Secondary | ICD-10-CM | POA: Diagnosis not present

## 2016-10-13 NOTE — Progress Notes (Signed)
Office: [phone]  /  Fax: [phone]   HPI:   Chief Complaint: OBESITY [name] is here to discuss her progress with her obesity treatment plan. She is following her eating plan approximately 95 % of the time and states she is exercising 0 minutes 0 times per week. [name] continues to do well with weight loss. She journals sometimes but is still mostly following the category 2 plan. She still notes cravings, especially later in the day. She walks over 17,000 steps per day. Her weight is 176 lb (79.8 kg) today and has had a weight loss of 4 pounds over a period of 2 weeks since her last visit. She has lost 12 lbs since starting treatment with [name].  Hypertension [name] is a 46 y.o. female with hypertension.  [name] denies chest pain, headache, lightheadedness or shortness of breath on exertion. She is working weight loss to help control her blood Lane with the goal of decreasing her risk of heart attack and stroke. [name] was put on Diovan 180 mg qd and doing well with diet and weight loss. Blood Lane is decreased. [name].    Wt Readings from Last 500 Encounters:  10/13/16 176 lb (79.8 kg)  09/29/16 180 lb (81.6 kg)  09/15/16 181 lb (82.1 kg)  08/31/16 182 lb (82.6 kg)  08/13/16 188 lb (85.3 kg)  01/03/16 191 lb 2 oz (86.7 kg)     ALLERGIES: No Known Allergies  MEDICATIONS: Current Outpatient Prescriptions on File Prior to Visit  Medication Sig Dispense Refill  . ibuprofen (ADVIL,MOTRIN) 400 MG tablet Take 1 tablet (400 mg total) by mouth 3 (three) times daily. 30 tablet 0  . magnesium oxide (MAG-OX) 400 MG tablet Take 400 mg by mouth daily.    . Multiple Vitamin (MULTIVITAMIN) tablet Take 1 tablet by mouth daily.    . SUMAtriptan (IMITREX) 50 MG tablet Take 50 mg by mouth every 2 (two) hours as needed for migraine. May repeat in 2 hours if headache persists or recurs.    . valsartan (DIOVAN) 160 MG tablet Take 80 mg by mouth  daily.     . Vitamin D, Ergocalciferol, (DRISDOL) 50000 units CAPS capsule Take 1 capsule (50,000 Units total) by mouth every 7 (seven) days. 4 capsule 0   No current facility-administered medications on file prior to visit.     PAST MEDICAL HISTORY: Past Medical History:  Diagnosis Date  . Anemia   . Asthma    as child  . Back pain   . GERD (gastroesophageal reflux disease)   . Headache   . Hypertension   . Joint pain   . Migraines   . Parathyroid abnormality ([place])   . Swelling    feet and legs  . Vitamin D deficiency     PAST SURGICAL HISTORY: Past Surgical History:  Procedure Laterality Date  . FOOT SURGERY Bilateral 2001  . PARATHYROIDECTOMY N/A 01/03/2016   Procedure: PARATHYROIDECTOMY;  Surgeon: Leta Baptist, MD;  Location: [facility];  Service: ENT;  Laterality: N/A;  . TUBAL LIGATION      SOCIAL HISTORY: Social History  Substance Use Topics  . Smoking status: Never Smoker  . Smokeless tobacco: Never Used  . Alcohol use No    FAMILY HISTORY: Family History  Problem Relation Age of Onset  . Hypertension Father   . Cancer Maternal Grandmother   . Heart disease Paternal Grandmother   . Cancer Paternal Grandmother  ROS: Review of Systems  Constitutional: Positive for weight loss.  Respiratory: Negative for shortness of breath (with exertion).   Cardiovascular: Negative for chest pain.       Negative lightheadedness  Neurological: Negative for headaches.    PHYSICAL EXAM: Blood Lane 102/72, pulse 76, temperature 98.1 F (36.7 C), temperature source Oral, height 5\' 6"  (1.676 m), weight 176 lb (79.8 kg), SpO2 99 %. Body mass index is 28.41 kg/m. Physical Exam  Constitutional: She is oriented to person, place, and time. She appears well-developed and well-nourished.  Cardiovascular: Normal rate.   Pulmonary/Chest: Effort normal.  Musculoskeletal: Normal range of motion.  Neurological: She is oriented to person, place, and time.    Skin: Skin is warm and dry.  Psychiatric: She has a normal mood and affect. Her behavior is normal.  Vitals reviewed.   RECENT LABS AND TESTS: BMET    Component Value Date/Time   NA 139 08/13/2016 0928   K 4.9 08/13/2016 0928   CL 101 08/13/2016 0928   CO2 25 08/13/2016 0928   GLUCOSE 78 08/13/2016 0928   BUN 15 08/13/2016 0928   CREATININE 0.84 08/13/2016 0928   CALCIUM 9.1 08/13/2016 0928   CALCIUM 7.5 (L) 01/03/2016 1520   GFRNONAA 84 08/13/2016 0928   GFRAA 96 08/13/2016 0928   Lab Results  Component Value Date   HGBA1C 5.5 08/13/2016   Lab Results  Component Value Date   INSULIN 7.9 08/13/2016   CBC    Component Value Date/Time   WBC 7.2 08/13/2016 0928   RBC 4.45 08/13/2016 0928   HCT 40.8 08/13/2016 0928   MCV 92 08/13/2016 0928   MCH 27.9 08/13/2016 0928   MCHC 30.4 (L) 08/13/2016 0928   RDW 13.3 08/13/2016 0928   LYMPHSABS 2.4 08/13/2016 0928   EOSABS 0.1 08/13/2016 0928   BASOSABS 0.0 08/13/2016 0928   Iron/TIBC/Ferritin/ %Sat No results found for: IRON, TIBC, FERRITIN, IRONPCTSAT Lipid Panel     Component Value Date/Time   CHOL 194 08/13/2016 0928   TRIG 142 08/13/2016 0928   HDL 51 08/13/2016 0928   LDLCALC 115 (H) 08/13/2016 0928   Hepatic Function Panel     Component Value Date/Time   PROT 6.8 08/13/2016 0928   ALBUMIN 4.1 08/13/2016 0928   AST 16 08/13/2016 0928   ALT 13 08/13/2016 0928   ALKPHOS 53 08/13/2016 0928   BILITOT 0.3 08/13/2016 0928      Component Value Date/Time   TSH 2.080 08/13/2016 0928    ASSESSMENT AND PLAN: Essential hypertension  Class 1 obesity without serious comorbidity with body mass index (BMI) of 30.0 to 30.9 in adult, unspecified obesity type - Starting BMI greater then 30  PLAN:  Hypertension We discussed sodium restriction, working on healthy weight loss, and a regular exercise program as the means to achieve improved blood Lane control with goal to control blood Lane with diet and  exercise. [name] agreed with this plan and agreed to follow up as directed. We will continue to monitor her blood Lane as well as her progress with the above lifestyle modifications. She agrees to decrease Diovan to 80 mg qd and we will re-check blood Lane in 2 weeks. She will watch for signs of hypotension as she continues her lifestyle modifications.   Obesity [name] is currently in the action stage of change. As such, her goal is to continue with weight loss efforts She has agreed to keep a food journal with 1200 to 1300 calories and 75 grams  of protein daily [name] has been instructed to work up to a goal of 150 minutes of combined cardio and strengthening exercise per week for weight loss and overall health benefits. We discussed the following Behavioral Modification Stratagies today: increasing H2O, increasing lean protein intake and decreasing sodium intake  [name] has agreed to follow up with our clinic in 2 weeks. She was informed of the importance of frequent follow up visits to maximize her success with intensive lifestyle modifications for her multiple health conditions.  I, Doreene Nest, am acting as scribe for Dennard Nip, MD  I have reviewed the above documentation for accuracy and completeness, and I agree with the above. -Dennard Nip, MD

## 2016-10-14 ENCOUNTER — Ambulatory Visit (INDEPENDENT_AMBULATORY_CARE_PROVIDER_SITE_OTHER): Payer: 59 | Admitting: Family Medicine

## 2016-10-27 ENCOUNTER — Ambulatory Visit (INDEPENDENT_AMBULATORY_CARE_PROVIDER_SITE_OTHER): Payer: 59 | Admitting: Family Medicine

## 2016-10-27 VITALS — BP 128/82 | HR 87 | Temp 97.9°F | Ht 66.0 in | Wt 175.0 lb

## 2016-10-27 DIAGNOSIS — E669 Obesity, unspecified: Secondary | ICD-10-CM

## 2016-10-27 DIAGNOSIS — E559 Vitamin D deficiency, unspecified: Secondary | ICD-10-CM | POA: Diagnosis not present

## 2016-10-27 DIAGNOSIS — F3289 Other specified depressive episodes: Secondary | ICD-10-CM

## 2016-10-27 MED ORDER — BUPROPION HCL ER (SR) 150 MG PO TB12: 150.0000 mg | ORAL_TABLET | Freq: Every morning | ORAL | 0 refills | Status: DC

## 2016-10-27 MED ORDER — VITAMIN D (ERGOCALCIFEROL) 1.25 MG (50000 UNIT) PO CAPS: 50000.0000 [IU] | ORAL_CAPSULE | ORAL | 0 refills | Status: DC

## 2016-10-27 MED FILL — BUPROPION SR 150 MG TABLET: 150 | 30 days supply | Qty: 30 | Fill #0

## 2016-10-27 MED FILL — VIT D2 1.25 MG (50,000 UNIT: 1.25 MG | 28 days supply | Qty: 4 | Fill #0

## 2016-10-27 NOTE — Progress Notes (Signed)
Office: [phone]  /  Fax: [phone]   HPI:   Chief Complaint: OBESITY [name] is here to discuss her progress with her obesity treatment plan. She is following her eating plan approximately 90 % of the time and states she is exercising 0 minutes 0 times per week. [name] changed to vegetarian plan on her own and feels she does better with that plan. She notes increased emotional eating after fighting with her son's this week. No exercise due to increased generalized body aches. Her weight is 175 lb (79.4 kg) today and has had a weight loss of 1 pound over a period of 2 weeks since her last visit. She has lost 13 lbs since starting treatment with [name].  Vitamin D deficiency [name] has a diagnosis of vitamin D deficiency. She is currently stable on vit D and denies nausea, vomiting or muscle weakness.  Depression with emotional eating behaviors [name] notes increased stress and increased emotional eating in the last 2 weeks and is using food for comfort to the extent that it is negatively impacting her health. She often snacks when she is not hungry. [name] sometimes feels she is out of control and then feels guilty that she made poor food choices. She has been working on behavior modification techniques to help reduce her emotional eating and has been somewhat successful. She notes increased irritability and decreased sleep. She is somewhat tearful in the office today. She shows no sign of suicidal or homicidal ideations.  Depression screen PHQ 2/9 08/13/2016  Decreased Interest 1  Down, Depressed, Hopeless 2  PHQ - 2 Score 3  Altered sleeping 1  Tired, decreased energy 3  Change in appetite 3  Feeling bad or failure about yourself  1  Trouble concentrating 1  Moving slowly or fidgety/restless 1  Suicidal thoughts 0  PHQ-9 Score 13     Wt Readings from Last 500 Encounters:  10/27/16 175 lb (79.4 kg)  10/13/16 176 lb (79.8 kg)  09/29/16 180 lb (81.6 kg)  09/15/16 181 lb (82.1 kg)    08/31/16 182 lb (82.6 kg)  08/13/16 188 lb (85.3 kg)  01/03/16 191 lb 2 oz (86.7 kg)     ALLERGIES: No Known Allergies  MEDICATIONS: Current Outpatient Prescriptions on File Prior to Visit  Medication Sig Dispense Refill  . ibuprofen (ADVIL,MOTRIN) 400 MG tablet Take 1 tablet (400 mg total) by mouth 3 (three) times daily. 30 tablet 0  . magnesium oxide (MAG-OX) 400 MG tablet Take 400 mg by mouth daily.    . Multiple Vitamin (MULTIVITAMIN) tablet Take 1 tablet by mouth daily.    . SUMAtriptan (IMITREX) 50 MG tablet Take 50 mg by mouth every 2 (two) hours as needed for migraine. May repeat in 2 hours if headache persists or recurs.    . valsartan (DIOVAN) 160 MG tablet Take 80 mg by mouth daily.      No current facility-administered medications on file prior to visit.     PAST MEDICAL HISTORY: Past Medical History:  Diagnosis Date  . Anemia   . Asthma    as child  . Back pain   . GERD (gastroesophageal reflux disease)   . Headache   . Hypertension   . Joint pain   . Migraines   . Parathyroid abnormality ([place])   . Swelling    feet and legs  . Vitamin D deficiency     PAST SURGICAL HISTORY: Past Surgical History:  Procedure Laterality Date  . FOOT SURGERY Bilateral 2001  .  PARATHYROIDECTOMY N/A 01/03/2016   Procedure: PARATHYROIDECTOMY;  Surgeon: Leta Baptist, MD;  Location: [facility];  Service: ENT;  Laterality: N/A;  . TUBAL LIGATION      SOCIAL HISTORY: Social History  Substance Use Topics  . Smoking status: Never Smoker  . Smokeless tobacco: Never Used  . Alcohol use No    FAMILY HISTORY: Family History  Problem Relation Age of Onset  . Hypertension Father   . Cancer Maternal Grandmother   . Heart disease Paternal Grandmother   . Cancer Paternal Grandmother     ROS: Review of Systems  Constitutional: Positive for weight loss.  Gastrointestinal: Negative for nausea and vomiting.  Musculoskeletal:       Negative muscle weakness   Psychiatric/Behavioral: Positive for depression. Negative for suicidal ideas. The patient has insomnia.        Irritability    PHYSICAL EXAM: Blood pressure 128/82, pulse 87, temperature 97.9 F (36.6 C), temperature source Oral, height 5\' 6"  (1.676 m), weight 175 lb (79.4 kg), SpO2 97 %. Body mass index is 28.25 kg/m. Physical Exam  Constitutional: She is oriented to person, place, and time. She appears well-developed and well-nourished.  Cardiovascular: Normal rate.   Pulmonary/Chest: Effort normal.  Musculoskeletal: Normal range of motion.  Neurological: She is oriented to person, place, and time.  Skin: Skin is warm and dry.  Vitals reviewed.   RECENT LABS AND TESTS: BMET    Component Value Date/Time   NA 139 08/13/2016 0928   K 4.9 08/13/2016 0928   CL 101 08/13/2016 0928   CO2 25 08/13/2016 0928   GLUCOSE 78 08/13/2016 0928   BUN 15 08/13/2016 0928   CREATININE 0.84 08/13/2016 0928   CALCIUM 9.1 08/13/2016 0928   CALCIUM 7.5 (L) 01/03/2016 1520   GFRNONAA 84 08/13/2016 0928   GFRAA 96 08/13/2016 0928   Lab Results  Component Value Date   HGBA1C 5.5 08/13/2016   Lab Results  Component Value Date   INSULIN 7.9 08/13/2016   CBC    Component Value Date/Time   WBC 7.2 08/13/2016 0928   RBC 4.45 08/13/2016 0928   HCT 40.8 08/13/2016 0928   MCV 92 08/13/2016 0928   MCH 27.9 08/13/2016 0928   MCHC 30.4 (L) 08/13/2016 0928   RDW 13.3 08/13/2016 0928   LYMPHSABS 2.4 08/13/2016 0928   EOSABS 0.1 08/13/2016 0928   BASOSABS 0.0 08/13/2016 0928   Iron/TIBC/Ferritin/ %Sat No results found for: IRON, TIBC, FERRITIN, IRONPCTSAT Lipid Panel     Component Value Date/Time   CHOL 194 08/13/2016 0928   TRIG 142 08/13/2016 0928   HDL 51 08/13/2016 0928   LDLCALC 115 (H) 08/13/2016 0928   Hepatic Function Panel     Component Value Date/Time   PROT 6.8 08/13/2016 0928   ALBUMIN 4.1 08/13/2016 0928   AST 16 08/13/2016 0928   ALT 13 08/13/2016 0928   ALKPHOS 53  08/13/2016 0928   BILITOT 0.3 08/13/2016 0928      Component Value Date/Time   TSH 2.080 08/13/2016 0928    ASSESSMENT AND PLAN: Vitamin D deficiency - Plan: Vitamin D, Ergocalciferol, (DRISDOL) 50000 units CAPS capsule  Other depression - emotional eating  Class 1 obesity without serious comorbidity in adult, unspecified BMI, unspecified obesity type  PLAN:  Vitamin D Deficiency Frances was informed that low vitamin D levels contributes to fatigue and are associated with obesity, breast, and colon cancer. She agrees to continue to take prescription Vit D @50 ,000 IU every week,  we will refill for 1 month and will follow up for routine testing of vitamin D, at least 2-3 times per year. She was informed of the risk of over-replacement of vitamin D and agrees to not increase her dose unless he discusses this with [name] first.  Depression with Emotional Eating Behaviors We discussed behavior modification techniques today to help Shontia deal with her emotional eating and depression. She has agreed to start to take Wellbutrin SR 150 mg every morning #30 with no refills and agreed to follow up as directed.  Obesity Lanelle is currently in the action stage of change. As such, her goal is to continue with weight loss efforts She has agreed to follow our protein rich vegetarian plan Naveyah has been instructed to work up to a goal of 150 minutes of combined cardio and strengthening exercise per week for weight loss and overall health benefits. We discussed the following Behavioral Modification Stratagies today: increasing lean protein intake, dealing with family or coworker sabotage and emotional eating strategies  Mirtie has agreed to follow up with our clinic in 2 weeks. She was informed of the importance of frequent follow up visits to maximize her success with intensive lifestyle modifications for her multiple health conditions.  I, Doreene Nest, am acting as scribe for Dennard Nip, MD  I have  reviewed the above documentation for accuracy and completeness, and I agree with the above. -Dennard Nip, MD

## 2016-11-10 ENCOUNTER — Ambulatory Visit (INDEPENDENT_AMBULATORY_CARE_PROVIDER_SITE_OTHER): Payer: 59 | Admitting: Family Medicine

## 2016-11-10 VITALS — BP 121/79 | HR 67 | Temp 97.7°F | Ht 66.0 in | Wt 174.0 lb

## 2016-11-10 DIAGNOSIS — E784 Other hyperlipidemia: Secondary | ICD-10-CM

## 2016-11-10 DIAGNOSIS — Z9189 Other specified personal risk factors, not elsewhere classified: Secondary | ICD-10-CM

## 2016-11-10 DIAGNOSIS — E559 Vitamin D deficiency, unspecified: Secondary | ICD-10-CM | POA: Diagnosis not present

## 2016-11-10 DIAGNOSIS — E669 Obesity, unspecified: Secondary | ICD-10-CM

## 2016-11-10 DIAGNOSIS — E8881 Metabolic syndrome: Secondary | ICD-10-CM | POA: Diagnosis not present

## 2016-11-10 DIAGNOSIS — Z6830 Body mass index (BMI) 30.0-30.9, adult: Secondary | ICD-10-CM | POA: Diagnosis not present

## 2016-11-10 DIAGNOSIS — E7849 Other hyperlipidemia: Secondary | ICD-10-CM

## 2016-11-10 NOTE — Progress Notes (Signed)
Office: [phone]  /  Fax: [phone]   HPI:   Chief Complaint: OBESITY Genever is here to discuss her progress with her obesity treatment plan. She is on a lower carbohydrate, vegetable and lean protein rich diet/category 2 plan and is following her eating plan approximately 85 to 90 % of the time. She states she is exercising 0 minutes 0 times per week. Adama is doing well journaling all her meals but often missing extra calories, enough to slow down her weight loss. She is not always meeting her protein goals. Her weight is 174 lb (78.9 kg) today and has had a weight loss of 1 pound over a period of 2 weeks since her last visit. She has lost 14 lbs since starting treatment with [name].  Vitamin D deficiency Naseem has a diagnosis of vitamin D deficiency. She is currently stable on vit D and denies nausea, vomiting or muscle weakness.  Hyperlipidemia Marleena has hyperlipidemia and has been attempting to control her cholesterol levels with intensive lifestyle modification including a low saturated fat diet, exercise and weight loss. She denies any chest pain, claudication or myalgias.  Insulin Resistance Dalal has a diagnosis of insulin resistance based on her elevated fasting insulin level >5. Although Makynlee's blood glucose readings are still under good control, insulin resistance puts her at greater risk of metabolic syndrome and diabetes. She is not taking metformin currently and continues to work on diet and exercise to decrease risk of diabetes. She is currently stable.  At risk for diabetes Aarionna is at higher than average risk for developing diabetes due to her obesity and insulin resistance. She currently denies polyuria or polydipsia.  Wt Readings from Last 500 Encounters:  11/10/16 174 lb (78.9 kg)  10/27/16 175 lb (79.4 kg)  10/13/16 176 lb (79.8 kg)  09/29/16 180 lb (81.6 kg)  09/15/16 181 lb (82.1 kg)  08/31/16 182 lb (82.6 kg)  08/13/16 188 lb (85.3 kg)  01/03/16 191 lb 2 oz  (86.7 kg)     ALLERGIES: No Known Allergies  MEDICATIONS: Current Outpatient Prescriptions on File Prior to Visit  Medication Sig Dispense Refill  . buPROPion (WELLBUTRIN SR) 150 MG 12 hr tablet Take 1 tablet (150 mg total) by mouth every morning. 30 tablet 0  . ibuprofen (ADVIL,MOTRIN) 400 MG tablet Take 1 tablet (400 mg total) by mouth 3 (three) times daily. 30 tablet 0  . magnesium oxide (MAG-OX) 400 MG tablet Take 400 mg by mouth daily.    . Multiple Vitamin (MULTIVITAMIN) tablet Take 1 tablet by mouth daily.    . SUMAtriptan (IMITREX) 50 MG tablet Take 50 mg by mouth every 2 (two) hours as needed for migraine. May repeat in 2 hours if headache persists or recurs.    . valsartan (DIOVAN) 160 MG tablet Take 80 mg by mouth daily.     . Vitamin D, Ergocalciferol, (DRISDOL) 50000 units CAPS capsule Take 1 capsule (50,000 Units total) by mouth every 7 (seven) days. 4 capsule 0   No current facility-administered medications on file prior to visit.     PAST MEDICAL HISTORY: Past Medical History:  Diagnosis Date  . Anemia   . Asthma    as child  . Back pain   . GERD (gastroesophageal reflux disease)   . Headache   . Hypertension   . Joint pain   . Migraines   . Parathyroid abnormality ([place])   . Swelling    feet and legs  . Vitamin D deficiency  PAST SURGICAL HISTORY: Past Surgical History:  Procedure Laterality Date  . FOOT SURGERY Bilateral 2001  . PARATHYROIDECTOMY N/A 01/03/2016   Procedure: PARATHYROIDECTOMY;  Surgeon: Leta Baptist, MD;  Location: [facility];  Service: ENT;  Laterality: N/A;  . TUBAL LIGATION      SOCIAL HISTORY: Social History  Substance Use Topics  . Smoking status: Never Smoker  . Smokeless tobacco: Never Used  . Alcohol use No    FAMILY HISTORY: Family History  Problem Relation Age of Onset  . Hypertension Father   . Cancer Maternal Grandmother   . Heart disease Paternal Grandmother   . Cancer Paternal Grandmother      ROS: Review of Systems  Constitutional: Positive for weight loss.  Cardiovascular: Negative for chest pain and claudication.  Gastrointestinal: Negative for nausea and vomiting.  Genitourinary: Negative for frequency.  Musculoskeletal: Negative for myalgias.       Negative muscle weakness  Endo/Heme/Allergies: Negative for polydipsia.    PHYSICAL EXAM: Blood pressure 121/79, pulse 67, temperature 97.7 F (36.5 C), temperature source Oral, height 5\' 6"  (1.676 m), weight 174 lb (78.9 kg), SpO2 98 %. Body mass index is 28.08 kg/m. Physical Exam  Constitutional: She is oriented to person, place, and time. She appears well-developed and well-nourished.  Cardiovascular: Normal rate.   Pulmonary/Chest: Effort normal.  Musculoskeletal: Normal range of motion.  Neurological: She is oriented to person, place, and time.  Skin: Skin is warm and dry.  Psychiatric: She has a normal mood and affect. Her behavior is normal.  Vitals reviewed.   RECENT LABS AND TESTS: BMET    Component Value Date/Time   NA 139 08/13/2016 0928   K 4.9 08/13/2016 0928   CL 101 08/13/2016 0928   CO2 25 08/13/2016 0928   GLUCOSE 78 08/13/2016 0928   BUN 15 08/13/2016 0928   CREATININE 0.84 08/13/2016 0928   CALCIUM 9.1 08/13/2016 0928   CALCIUM 7.5 (L) 01/03/2016 1520   GFRNONAA 84 08/13/2016 0928   GFRAA 96 08/13/2016 0928   Lab Results  Component Value Date   HGBA1C 5.5 08/13/2016   Lab Results  Component Value Date   INSULIN 7.9 08/13/2016   CBC    Component Value Date/Time   WBC 7.2 08/13/2016 0928   RBC 4.45 08/13/2016 0928   HCT 40.8 08/13/2016 0928   MCV 92 08/13/2016 0928   MCH 27.9 08/13/2016 0928   MCHC 30.4 (L) 08/13/2016 0928   RDW 13.3 08/13/2016 0928   LYMPHSABS 2.4 08/13/2016 0928   EOSABS 0.1 08/13/2016 0928   BASOSABS 0.0 08/13/2016 0928   Iron/TIBC/Ferritin/ %Sat No results found for: IRON, TIBC, FERRITIN, IRONPCTSAT Lipid Panel     Component Value Date/Time    CHOL 194 08/13/2016 0928   TRIG 142 08/13/2016 0928   HDL 51 08/13/2016 0928   LDLCALC 115 (H) 08/13/2016 0928   Hepatic Function Panel     Component Value Date/Time   PROT 6.8 08/13/2016 0928   ALBUMIN 4.1 08/13/2016 0928   AST 16 08/13/2016 0928   ALT 13 08/13/2016 0928   ALKPHOS 53 08/13/2016 0928   BILITOT 0.3 08/13/2016 0928      Component Value Date/Time   TSH 2.080 08/13/2016 0928    ASSESSMENT AND PLAN: Vitamin D deficiency - Plan: Comprehensive metabolic panel, VITAMIN D 25 Hydroxy (Vit-D Deficiency, Fractures)  Insulin resistance - Plan: Hemoglobin A1c, Insulin, random  Other hyperlipidemia - Plan: Lipid Panel With LDL/HDL Ratio  At risk for diabetes mellitus -  Plan: Comprehensive metabolic panel, Hemoglobin A1c, Insulin, random  Class 1 obesity without serious comorbidity with body mass index (BMI) of 30.0 to 30.9 in adult, unspecified obesity type - Starting BMI greater then 30  PLAN:  Vitamin D Deficiency Malori was informed that low vitamin D levels contributes to fatigue and are associated with obesity, breast, and colon cancer. She agrees to continue to take prescription Vit D @50 ,000 IU every week, we will check labs and will follow up for routine testing of vitamin D, at least 2-3 times per year. She was informed of the risk of over-replacement of vitamin D and agrees to not increase her dose unless he discusses this with [name] first. Inell agrees to follow up with our clinic in 2 weeks.  Hyperlipidemia Coriann was informed of the American Heart Association Guidelines emphasizing intensive lifestyle modifications as the first line treatment for hyperlipidemia. We discussed many lifestyle modifications today in depth, and Myrtice will continue to work on decreasing saturated fats such as fatty red meat, butter and many fried foods. She will also increase vegetables and lean protein in her diet and continue to work on exercise and weight loss efforts. We will check labs and  Klara agrees to follow up with our clinic in 2 weeks.  Insulin Resistance Analysia will continue to work on weight loss, exercise, and decreasing simple carbohydrates in her diet to help decrease the risk of diabetes. She was informed that eating too many simple carbohydrates or too many calories at one sitting increases the likelihood of GI side effects. We will check labs and Tatanisha agreed to follow up with [name] as directed to monitor her progress.  Diabetes risk counselling Renisha was given extended (at least 15 minutes) diabetes prevention counseling today. She is 46 y.o. female and has risk factors for diabetes including obesity. We discussed intensive lifestyle modifications today with an emphasis on weight loss as well as increasing exercise and decreasing simple carbohydrates in her diet.  Obesity Tashunda is currently in the action stage of change. As such, her goal is to continue with weight loss efforts She has agreed to keep a food journal with 1400 calories and 75+ grams of protein daily Hayley has been instructed to work up to a goal of 150 minutes of combined cardio and strengthening exercise per week for weight loss and overall health benefits. We discussed the following Behavioral Modification Strategies today: increasing lean protein intake  Ailanie has agreed to follow up with our clinic in 2 weeks. She was informed of the importance of frequent follow up visits to maximize her success with intensive lifestyle modifications for her multiple health conditions.  I, Doreene Nest, am acting as scribe for Dennard Nip, MD  I have reviewed the above documentation for accuracy and completeness, and I agree with the above. -Dennard Nip, MD

## 2016-11-11 LAB — COMPREHENSIVE METABOLIC PANEL
A/G RATIO: 1.7 (ref 1.2–2.2)
ALK PHOS: 48 IU/L (ref 39–117)
ALT: 12 IU/L (ref 0–32)
AST: 15 IU/L (ref 0–40)
Albumin: 4.2 g/dL (ref 3.5–5.5)
BILIRUBIN TOTAL: 0.2 mg/dL (ref 0.0–1.2)
BUN/Creatinine Ratio: 20 (ref 9–23)
BUN: 17 mg/dL (ref 6–24)
CALCIUM: 8.6 mg/dL — AB (ref 8.7–10.2)
CHLORIDE: 103 mmol/L (ref 96–106)
CO2: 23 mmol/L (ref 18–29)
Creatinine, Ser: 0.85 mg/dL (ref 0.57–1.00)
GFR calc Af Amer: 95 mL/min/{1.73_m2} (ref >59)
GFR, EST NON AFRICAN AMERICAN: 82 mL/min/{1.73_m2} (ref >59)
GLOBULIN, TOTAL: 2.5 g/dL (ref 1.5–4.5)
Glucose: 83 mg/dL (ref 65–99)
Potassium: 4.2 mmol/L (ref 3.5–5.2)
SODIUM: 141 mmol/L (ref 134–144)
Total Protein: 6.7 g/dL (ref 6.0–8.5)

## 2016-11-11 LAB — LIPID PANEL WITH LDL/HDL RATIO
Cholesterol, Total: 193 mg/dL (ref 100–199)
HDL: 54 mg/dL (ref >39)
LDL Calculated: 127 mg/dL — ABNORMAL HIGH (ref 0–99)
LDL/HDL RATIO: 2.4 ratio (ref 0.0–3.2)
Triglycerides: 61 mg/dL (ref 0–149)
VLDL CHOLESTEROL CAL: 12 mg/dL (ref 5–40)

## 2016-11-11 LAB — HEMOGLOBIN A1C
ESTIMATED AVERAGE GLUCOSE: 114 mg/dL
Hgb A1c MFr Bld: 5.6 % (ref 4.8–5.6)

## 2016-11-11 LAB — VITAMIN D 25 HYDROXY (VIT D DEFICIENCY, FRACTURES): Vit D, 25-Hydroxy: 54.3 ng/mL (ref 30.0–100.0)

## 2016-11-11 LAB — INSULIN, RANDOM: INSULIN: 5.6 u[IU]/mL (ref 2.6–24.9)

## 2016-11-24 ENCOUNTER — Ambulatory Visit (INDEPENDENT_AMBULATORY_CARE_PROVIDER_SITE_OTHER): Payer: 59 | Admitting: Family Medicine

## 2016-11-24 VITALS — BP 117/81 | HR 81 | Temp 97.8°F | Ht 66.0 in | Wt 174.0 lb

## 2016-11-24 DIAGNOSIS — F3289 Other specified depressive episodes: Secondary | ICD-10-CM | POA: Diagnosis not present

## 2016-11-24 DIAGNOSIS — Z9189 Other specified personal risk factors, not elsewhere classified: Secondary | ICD-10-CM

## 2016-11-24 DIAGNOSIS — E669 Obesity, unspecified: Secondary | ICD-10-CM

## 2016-11-24 DIAGNOSIS — Z6830 Body mass index (BMI) 30.0-30.9, adult: Secondary | ICD-10-CM | POA: Diagnosis not present

## 2016-11-24 DIAGNOSIS — E559 Vitamin D deficiency, unspecified: Secondary | ICD-10-CM | POA: Diagnosis not present

## 2016-11-24 MED ORDER — VITAMIN D (ERGOCALCIFEROL) 1.25 MG (50000 UNIT) PO CAPS: 50000.0000 [IU] | ORAL_CAPSULE | ORAL | 0 refills | Status: DC

## 2016-11-24 NOTE — Progress Notes (Signed)
Office: [phone]  /  Fax: [phone]   HPI:   Chief Complaint: OBESITY [name] is here to discuss her progress with her obesity treatment plan. She is on the  keep a food journal with 1400 calories and 75+ grams of protein  and is following her eating plan approximately 95 % of the time. She states she is exercising 0 minutes 0 times per week. [name] has maintained weight well but is retaining fluid today. She is doing well journaling in the daytime but not as well in the pm. Her weight is 174 lb (78.9 kg) today and has maintained weight over a period of 2 weeks since her last visit. She has lost 14 lbs since starting treatment with [name].  Vitamin D deficiency [name] has a diagnosis of vitamin D deficiency. She is currently taking vit D and now at goal. She denies nausea, vomiting or muscle weakness.  Depression with emotional eating behaviors [name] started Wellbutrin and notes decreased emotional eating. [name] struggles with emotional eating and using food for comfort to the extent that it is negatively impacting her health. She often snacks when she is not hungry. [name] sometimes feels she is out of control and then feels guilty that she made poor food choices. She has been working on behavior modification techniques to help reduce her emotional eating and has been somewhat successful. Her blood pressure is stable and She shows no sign of suicidal or homicidal ideations. [name] still notes fatigue.  Hypocalcemia [name] has a diagnosis of hypocalcemia with history of recent increase in leg cramps, history of parathyroidectomy and recent calcium has decreased. Her vitamin D is within normal limits, not elevated.  At Risk for Osteoporosis [name] is at higher risk of osteoporosis due to vitamin D deficiency and hypocalcemia.    Depression screen PHQ 2/9 08/13/2016  Decreased Interest 1  Down, Depressed, Hopeless 2  PHQ - 2 Score 3  Altered sleeping 1  Tired, decreased energy 3  Change in appetite 3    Feeling bad or failure about yourself  1  Trouble concentrating 1  Moving slowly or fidgety/restless 1  Suicidal thoughts 0  PHQ-9 Score 13      ALLERGIES: No Known Allergies  MEDICATIONS: Current Outpatient Prescriptions on File Prior to Visit  Medication Sig Dispense Refill  . buPROPion (WELLBUTRIN SR) 150 MG 12 hr tablet Take 1 tablet (150 mg total) by mouth every morning. 30 tablet 0  . ibuprofen (ADVIL,MOTRIN) 400 MG tablet Take 1 tablet (400 mg total) by mouth 3 (three) times daily. 30 tablet 0  . magnesium oxide (MAG-OX) 400 MG tablet Take 400 mg by mouth daily.    . Multiple Vitamin (MULTIVITAMIN) tablet Take 1 tablet by mouth daily.    . SUMAtriptan (IMITREX) 50 MG tablet Take 50 mg by mouth every 2 (two) hours as needed for migraine. May repeat in 2 hours if headache persists or recurs.    . valsartan (DIOVAN) 160 MG tablet Take 80 mg by mouth daily.     . Vitamin D, Ergocalciferol, (DRISDOL) 50000 units CAPS capsule Take 1 capsule (50,000 Units total) by mouth every 7 (seven) days. 4 capsule 0   No current facility-administered medications on file prior to visit.     PAST MEDICAL HISTORY: Past Medical History:  Diagnosis Date  . Anemia   . Asthma    as child  . Back pain   . GERD (gastroesophageal reflux disease)   . Headache   . Hypertension   .  Joint pain   . Migraines   . Parathyroid abnormality ([place])   . Swelling    feet and legs  . Vitamin D deficiency     PAST SURGICAL HISTORY: Past Surgical History:  Procedure Laterality Date  . FOOT SURGERY Bilateral 2001  . PARATHYROIDECTOMY N/A 01/03/2016   Procedure: PARATHYROIDECTOMY;  Surgeon: Leta Baptist, MD;  Location: [place];  Service: ENT;  Laterality: N/A;  . TUBAL LIGATION      SOCIAL HISTORY: Social History  Substance Use Topics  . Smoking status: Never Smoker  . Smokeless tobacco: Never Used  . Alcohol use No    FAMILY HISTORY: Family History  Problem Relation Age of Onset   . Hypertension Father   . Cancer Maternal Grandmother   . Heart disease Paternal Grandmother   . Cancer Paternal Grandmother     ROS: Review of Systems  Constitutional: Positive for malaise/fatigue. Negative for weight loss.  Gastrointestinal: Negative for nausea and vomiting.  Musculoskeletal:       Leg cramping Negative muscle weakness  Psychiatric/Behavioral: Positive for depression. Negative for suicidal ideas.    PHYSICAL EXAM: Blood pressure 117/81, pulse 81, temperature 97.8 F (36.6 C), temperature source Oral, height 5\' 6"  (1.676 m), weight 174 lb (78.9 kg), SpO2 98 %. Body mass index is 28.08 kg/m. Physical Exam  Constitutional: She is oriented to person, place, and time. She appears well-developed and well-nourished.  Cardiovascular: Normal rate.   Pulmonary/Chest: Effort normal.  Musculoskeletal: Normal range of motion.  Neurological: She is oriented to person, place, and time.  Skin: Skin is warm and dry.  Psychiatric: She has a normal mood and affect. Her behavior is normal.  Vitals reviewed.   RECENT LABS AND TESTS: BMET    Component Value Date/Time   NA 141 11/10/2016 0824   K 4.2 11/10/2016 0824   CL 103 11/10/2016 0824   CO2 23 11/10/2016 0824   GLUCOSE 83 11/10/2016 0824   BUN 17 11/10/2016 0824   CREATININE 0.85 11/10/2016 0824   CALCIUM 8.6 (L) 11/10/2016 0824   CALCIUM 7.5 (L) 01/03/2016 1520   GFRNONAA 82 11/10/2016 0824   GFRAA 95 11/10/2016 0824   Lab Results  Component Value Date   HGBA1C 5.6 11/10/2016   HGBA1C 5.5 08/13/2016   Lab Results  Component Value Date   INSULIN 5.6 11/10/2016   INSULIN 7.9 08/13/2016   CBC    Component Value Date/Time   WBC 7.2 08/13/2016 0928   RBC 4.45 08/13/2016 0928   HCT 40.8 08/13/2016 0928   MCV 92 08/13/2016 0928   MCH 27.9 08/13/2016 0928   MCHC 30.4 (L) 08/13/2016 0928   RDW 13.3 08/13/2016 0928   LYMPHSABS 2.4 08/13/2016 0928   EOSABS 0.1 08/13/2016 0928   BASOSABS 0.0 08/13/2016  0928   Iron/TIBC/Ferritin/ %Sat No results found for: IRON, TIBC, FERRITIN, IRONPCTSAT Lipid Panel     Component Value Date/Time   CHOL 193 11/10/2016 0824   TRIG 61 11/10/2016 0824   HDL 54 11/10/2016 0824   LDLCALC 127 (H) 11/10/2016 0824   Hepatic Function Panel     Component Value Date/Time   PROT 6.7 11/10/2016 0824   ALBUMIN 4.2 11/10/2016 0824   AST 15 11/10/2016 0824   ALT 12 11/10/2016 0824   ALKPHOS 48 11/10/2016 0824   BILITOT 0.2 11/10/2016 0824      Component Value Date/Time   TSH 2.080 08/13/2016 0928    ASSESSMENT AND PLAN: Other depression  Vitamin D deficiency -  Plan: Vitamin D, Ergocalciferol, (DRISDOL) 50000 units CAPS capsule  Hypocalcemia  At risk for osteoporosis  Class 1 obesity without serious comorbidity with body mass index (BMI) of 30.0 to 30.9 in adult, unspecified obesity type - Staring BMI greater then 30  PLAN:  Vitamin D Deficiency Luzmaria was informed that low vitamin D levels contributes to fatigue and are associated with obesity, breast, and colon cancer. She agrees to continue to take prescription Vit D @50 ,000 IU every week, we will refill for 1 month and will follow up for routine testing of vitamin D, at least 2-3 times per year. She was informed of the risk of over-replacement of vitamin D and agrees to not increase her dose unless he discusses this with [name] first. Thelma agrees to follow up with our clinic in 2 weeks.  Depression with Emotional Eating Behaviors We discussed behavior modification techniques today to help Keisa deal with her emotional eating and depression. She has agreed to continue to take Wellbutrin SR 150 mg qd and agreed to follow up as directed.  Hypocalcemia We will check labs in 2 weeks to re-assess and Kaelene agrees to follow up with our clinic in 2 weeks.  At risk for osteoporosis Quinnetta is at risk for osteoporsis due to her vitamin D deficiency and hypocalcemia. She was encouraged to take her vitamin D and  follow her higher calcium diet and increase strengthening exercise to help strengthen her bones and decrease her risk of osteopenia and osteoporosis.  Obesity Shawntee is currently in the action stage of change. As such, her goal is to continue with weight loss efforts She has agreed to keep a food journal with 1200 to 1400 calories and 75+ grams of protein  Nadya has been instructed to work up to a goal of 150 minutes of combined cardio and strengthening exercise per week for weight loss and overall health benefits. We discussed the following Behavioral Modification Strategies today: increasing lean protein intake, decreasing simple carbohydrates  and emotional eating strategies  Evanna has agreed to follow up with our clinic in 2 weeks. She was informed of the importance of frequent follow up visits to maximize her success with intensive lifestyle modifications for her multiple health conditions.  I, Doreene Nest, am acting as scribe for Dennard Nip, MD  I have reviewed the above documentation for accuracy and completeness, and I agree with the above. -Dennard Nip, MD  OBESITY BEHAVIORAL INTERVENTION VISIT  Today's visit was # 8 out of 22.  Starting weight: 188 Starting date: 08/13/16 Todays weight : 174 Total lbs lost to date: 66 (Patients must lose 7 lbs in the first 6 months to continue with counseling)   ASK: We discussed the diagnosis of obesity with [name] today and [place] agreed to give [name] permission to discuss obesity behavioral modification therapy today.  ASSESS: Arvie has the diagnosis of obesity and her BMI today is 28.1 Sholanda is in the action stage of change   ADVISE: Marcel was educated on the multiple health risks of obesity as well as the benefit of weight loss to improve her health. She was advised of the need for long term treatment and the importance of lifestyle modifications.  AGREE: Multiple dietary modification options and treatment options were discussed and   Zurie agreed to keep a food journal with 1200 to 1400 calories and 75+ grams of protein  We discussed the following Behavioral Modification Stratagies today: increasing lean protein intake, decreasing simple carbohydrates  and emotional eating  strategies

## 2016-11-25 MED FILL — VIT D2 1.25 MG (50,000 UNIT: 1.25 MG | 28 days supply | Qty: 4 | Fill #0

## 2016-12-08 ENCOUNTER — Ambulatory Visit (INDEPENDENT_AMBULATORY_CARE_PROVIDER_SITE_OTHER): Payer: 59 | Admitting: Family Medicine

## 2016-12-08 VITALS — BP 125/87 | HR 82 | Temp 98.2°F | Ht 66.0 in | Wt 176.0 lb

## 2016-12-08 DIAGNOSIS — F3289 Other specified depressive episodes: Secondary | ICD-10-CM

## 2016-12-08 DIAGNOSIS — Z6830 Body mass index (BMI) 30.0-30.9, adult: Secondary | ICD-10-CM | POA: Diagnosis not present

## 2016-12-08 DIAGNOSIS — E669 Obesity, unspecified: Secondary | ICD-10-CM | POA: Diagnosis not present

## 2016-12-08 MED FILL — FLUCONAZOLE 150 MG TABLET: 150 | 4 days supply | Qty: 2 | Fill #0

## 2016-12-09 NOTE — Progress Notes (Signed)
Office: [phone]  /  Fax: [phone]   HPI:   Chief Complaint: OBESITY [name] is here to discuss her progress with her obesity treatment plan. She is on the  keep a food journal with 1200 to 1400 calories and 75+ grams of protein  and is following her eating plan approximately 95 % of the time. She states she is exercising 0 minutes 0 times per week. [name] is journaling and trying to keep her calories at 1300 but still gaining weight. She is guessing on calories more. She would like to change back to category 2 plan and start exercising. Her weight is 176 lb (79.8 kg) today and has had a weight gain of 2 pounds over a period of 2 weeks since her last visit. She has lost 12 lbs since starting treatment with [name].  Depression with emotional eating behaviors [name] started wellbutrin and noticed having word searching, so she stopped. She is still struggling with emotional eating and using food for comfort to the extent that it is negatively impacting her health. She often snacks when she is not hungry. [name] sometimes feels she is out of control and then feels guilty that she made poor food choices. She has been working on behavior modification techniques to help reduce her emotional eating and has been somewhat successful. She shows no sign of suicidal or homicidal ideations.  Depression screen PHQ 2/9 08/13/2016  Decreased Interest 1  Down, Depressed, Hopeless 2  PHQ - 2 Score 3  Altered sleeping 1  Tired, decreased energy 3  Change in appetite 3  Feeling bad or failure about yourself  1  Trouble concentrating 1  Moving slowly or fidgety/restless 1  Suicidal thoughts 0  PHQ-9 Score 13      ALLERGIES: No Known Allergies  MEDICATIONS: Current Outpatient Prescriptions on File Prior to Visit  Medication Sig Dispense Refill  . buPROPion (WELLBUTRIN SR) 150 MG 12 hr tablet Take 1 tablet (150 mg total) by mouth every morning. (Patient taking differently: Take 75 mg by mouth daily. Take one  half tablet (75mg ) by mouth once a day) 30 tablet 0  . ibuprofen (ADVIL,MOTRIN) 400 MG tablet Take 1 tablet (400 mg total) by mouth 3 (three) times daily. 30 tablet 0  . magnesium oxide (MAG-OX) 400 MG tablet Take 400 mg by mouth daily.    . Multiple Vitamin (MULTIVITAMIN) tablet Take 1 tablet by mouth daily.    . SUMAtriptan (IMITREX) 50 MG tablet Take 50 mg by mouth every 2 (two) hours as needed for migraine. May repeat in 2 hours if headache persists or recurs.    . valsartan (DIOVAN) 160 MG tablet Take 80 mg by mouth daily.     . Vitamin D, Ergocalciferol, (DRISDOL) 50000 units CAPS capsule Take 1 capsule (50,000 Units total) by mouth every 7 (seven) days. 4 capsule 0   No current facility-administered medications on file prior to visit.     PAST MEDICAL HISTORY: Past Medical History:  Diagnosis Date  . Anemia   . Asthma    as child  . Back pain   . GERD (gastroesophageal reflux disease)   . Headache   . Hypertension   . Joint pain   . Migraines   . Parathyroid abnormality ([place])   . Swelling    feet and legs  . Vitamin D deficiency     PAST SURGICAL HISTORY: Past Surgical History:  Procedure Laterality Date  . FOOT SURGERY Bilateral 2001  . PARATHYROIDECTOMY N/A 01/03/2016  Procedure: PARATHYROIDECTOMY;  Surgeon: Leta Baptist, MD;  Location: [place];  Service: ENT;  Laterality: N/A;  . TUBAL LIGATION      SOCIAL HISTORY: Social History  Substance Use Topics  . Smoking status: Never Smoker  . Smokeless tobacco: Never Used  . Alcohol use No    FAMILY HISTORY: Family History  Problem Relation Age of Onset  . Hypertension Father   . Cancer Maternal Grandmother   . Heart disease Paternal Grandmother   . Cancer Paternal Grandmother     ROS: Review of Systems  Constitutional: Negative for weight loss.  Psychiatric/Behavioral: Positive for depression. Negative for suicidal ideas.    PHYSICAL EXAM: Blood pressure 125/87, pulse 82, temperature 98.2  F (36.8 C), temperature source Oral, height 5\' 6"  (1.676 Anna), weight 176 lb (79.8 kg), SpO2 97 %. Body mass index is 28.41 kg/Anna. Physical Exam  Constitutional: She is oriented to person, place, and time. She appears well-developed and well-nourished.  Cardiovascular: Normal rate.   Pulmonary/Chest: Effort normal.  Musculoskeletal: Normal range of motion.  Neurological: She is oriented to person, place, and time.  Skin: Skin is warm and dry.  Vitals reviewed.   RECENT LABS AND TESTS: BMET    Component Value Date/Time   NA 141 11/10/2016 0824   K 4.2 11/10/2016 0824   CL 103 11/10/2016 0824   CO2 23 11/10/2016 0824   GLUCOSE 83 11/10/2016 0824   BUN 17 11/10/2016 0824   CREATININE 0.85 11/10/2016 0824   CALCIUM 8.6 (L) 11/10/2016 0824   CALCIUM 7.5 (L) 01/03/2016 1520   GFRNONAA 82 11/10/2016 0824   GFRAA 95 11/10/2016 0824   Lab Results  Component Value Date   HGBA1C 5.6 11/10/2016   HGBA1C 5.5 08/13/2016   Lab Results  Component Value Date   INSULIN 5.6 11/10/2016   INSULIN 7.9 08/13/2016   CBC    Component Value Date/Time   WBC 7.2 08/13/2016 0928   RBC 4.45 08/13/2016 0928   HGB 12.4 08/13/2016 0928   HCT 40.8 08/13/2016 0928   MCV 92 08/13/2016 0928   MCH 27.9 08/13/2016 0928   MCHC 30.4 (L) 08/13/2016 0928   RDW 13.3 08/13/2016 0928   LYMPHSABS 2.4 08/13/2016 0928   EOSABS 0.1 08/13/2016 0928   BASOSABS 0.0 08/13/2016 0928   Iron/TIBC/Ferritin/ %Sat No results found for: IRON, TIBC, FERRITIN, IRONPCTSAT Lipid Panel     Component Value Date/Time   CHOL 193 11/10/2016 0824   TRIG 61 11/10/2016 0824   HDL 54 11/10/2016 0824   LDLCALC 127 (H) 11/10/2016 0824   Hepatic Function Panel     Component Value Date/Time   PROT 6.7 11/10/2016 0824   ALBUMIN 4.2 11/10/2016 0824   AST 15 11/10/2016 0824   ALT 12 11/10/2016 0824   ALKPHOS 48 11/10/2016 0824   BILITOT 0.2 11/10/2016 0824      Component Value Date/Time   TSH 2.080 08/13/2016 0928     ASSESSMENT AND PLAN: Other depression  Class 1 obesity without serious comorbidity with body mass index (BMI) of 30.0 to 30.9 in adult, unspecified obesity type - Patient started program with BMI greater than 30  PLAN:  Depression with Emotional Eating Behaviors We discussed behavior modification techniques today to help [name] deal with her emotional eating and depression. She has agreed to decrease  Wellbutrin SR 150 mg qd to 1/2 tablet po every morning and follow up in 2 to 3 weeks. [name] was advised the nature of Wellbutrin SR is  okay to cut without risk of losing the SR properties)   Obesity [name] is currently in the action stage of change. As such, her goal is to continue with weight loss efforts She has agreed to follow the Category 2 plan [name] has been instructed to work up to a goal of 150 minutes of combined cardio and strengthening exercise per week or add strengthening for weight loss and overall health benefits. We discussed the following Behavioral Modification Strategies today: increasing lean protein intake  Keyasha has agreed to follow up with our clinic in 2 weeks. She was informed of the importance of frequent follow up visits to maximize her success with intensive lifestyle modifications for her multiple health conditions.  I, Doreene Nest, am acting as scribe for Dennard Nip, MD  I have reviewed the above documentation for accuracy and completeness, and I agree with the above. -Dennard Nip, MD  OBESITY BEHAVIORAL INTERVENTION VISIT  Today's visit was # 9 out of 22.  Starting weight: 188 lbs Starting date: 08/13/16 Today's weight : 176 lbs Today's date: 12/08/2016 Total lbs lost to date: 12 (Patients must lose 7 lbs in the first 6 months to continue with counseling)   ASK: We discussed the diagnosis of obesity with [name] today and [place] agreed to give [name] permission to discuss obesity behavioral modification therapy today.  ASSESS: [name] has the  diagnosis of obesity and her BMI today is 28.5 [name] is in the action stage of change   ADVISE: [name] was educated on the multiple health risks of obesity as well as the benefit of weight loss to improve her health. She was advised of the need for long term treatment and the importance of lifestyle modifications.  AGREE: Multiple dietary modification options and treatment options were discussed and  [name] agreed to follow the Category 2 plan We discussed the following Behavioral Modification Strategies today: increasing lean protein intake

## 2016-12-18 ENCOUNTER — Encounter: Payer: 59 | Admitting: Podiatry

## 2016-12-22 ENCOUNTER — Ambulatory Visit (INDEPENDENT_AMBULATORY_CARE_PROVIDER_SITE_OTHER): Payer: 59 | Admitting: Family Medicine

## 2016-12-22 VITALS — BP 136/93 | HR 92 | Temp 98.3°F | Ht 66.0 in | Wt 175.0 lb

## 2016-12-22 DIAGNOSIS — E559 Vitamin D deficiency, unspecified: Secondary | ICD-10-CM

## 2016-12-22 DIAGNOSIS — Z9189 Other specified personal risk factors, not elsewhere classified: Secondary | ICD-10-CM

## 2016-12-22 DIAGNOSIS — Z6830 Body mass index (BMI) 30.0-30.9, adult: Secondary | ICD-10-CM

## 2016-12-22 DIAGNOSIS — E669 Obesity, unspecified: Secondary | ICD-10-CM

## 2016-12-22 MED ORDER — VITAMIN D (ERGOCALCIFEROL) 1.25 MG (50000 UNIT) PO CAPS: 50000.0000 [IU] | ORAL_CAPSULE | ORAL | 0 refills | Status: DC

## 2016-12-22 MED ORDER — CALCIUM CARBONATE ANTACID 500 MG PO CHEW: 1.0000 | CHEWABLE_TABLET | Freq: Three times a day (TID) | ORAL | 0 refills | Status: DC

## 2016-12-23 ENCOUNTER — Other Ambulatory Visit (INDEPENDENT_AMBULATORY_CARE_PROVIDER_SITE_OTHER): Payer: Self-pay | Admitting: Family Medicine

## 2016-12-23 MED FILL — VIT D2 1.25 MG (50,000 UNIT: 1.25 MG | 28 days supply | Qty: 4 | Fill #0

## 2016-12-23 NOTE — Progress Notes (Signed)
Office: [phone]  /  Fax: [phone]   HPI:   Chief Complaint: OBESITY [name] is here to discuss her progress with her obesity treatment plan. She is on the  follow the Category 2 plan and is following her eating plan approximately 95 % of the time. She states she is walking at work 5 times per week. [name] continues to do well with weight loss. She is making better food choices and portion control. She denies hunger or cravings. Her weight is 175 lb (79.4 kg) today and has had a weight loss of 1 pound over a period of 2 weeks since her last visit. She has lost 13 lbs since starting treatment with [name].  Vitamin D deficiency [name] has a diagnosis of vitamin D deficiency. She is currently taking vit D and denies nausea, vomiting or muscle weakness.  Hypocalcemia [name] had decreased energy, fatigue and muscle cramps (level at 8.6 one month ago), we will re-check calcium level and magnesium level. She is at increased risk of hypocalcemia due to hypoparathyroid s/p parathyroidectomy. She denies muscle spasms.  ALLERGIES: No Known Allergies  MEDICATIONS: Current Outpatient Prescriptions on File Prior to Visit  Medication Sig Dispense Refill  . buPROPion (WELLBUTRIN SR) 150 MG 12 hr tablet Take 1 tablet (150 mg total) by mouth every morning. (Patient taking differently: Take 75 mg by mouth daily. Take one half tablet (75mg ) by mouth once a day) 30 tablet 0  . ibuprofen (ADVIL,MOTRIN) 400 MG tablet Take 1 tablet (400 mg total) by mouth 3 (three) times daily. 30 tablet 0  . magnesium oxide (MAG-OX) 400 MG tablet Take 400 mg by mouth daily.    . Multiple Vitamin (MULTIVITAMIN) tablet Take 1 tablet by mouth daily.    . SUMAtriptan (IMITREX) 50 MG tablet Take 50 mg by mouth every 2 (two) hours as needed for migraine. May repeat in 2 hours if headache persists or recurs.    . valsartan (DIOVAN) 160 MG tablet Take 80 mg by mouth daily.      No current facility-administered medications on file prior  to visit.     PAST MEDICAL HISTORY: Past Medical History:  Diagnosis Date  . Anemia   . Asthma    as child  . Back pain   . GERD (gastroesophageal reflux disease)   . Headache   . Hypertension   . Joint pain   . Migraines   . Parathyroid abnormality ([place])   . Swelling    feet and legs  . Vitamin D deficiency     PAST SURGICAL HISTORY: Past Surgical History:  Procedure Laterality Date  . FOOT SURGERY Bilateral 2001  . PARATHYROIDECTOMY N/A 01/03/2016   Procedure: PARATHYROIDECTOMY;  Surgeon: Leta Baptist, MD;  Location: [facility];  Service: ENT;  Laterality: N/A;  . TUBAL LIGATION      SOCIAL HISTORY: Social History  Substance Use Topics  . Smoking status: Never Smoker  . Smokeless tobacco: Never Used  . Alcohol use No    FAMILY HISTORY: Family History  Problem Relation Age of Onset  . Hypertension Father   . Cancer Maternal Grandmother   . Heart disease Paternal Grandmother   . Cancer Paternal Grandmother     ROS: Review of Systems  Constitutional: Positive for malaise/fatigue and weight loss.  Gastrointestinal: Negative for nausea and vomiting.  Musculoskeletal:       Muscle cramps Negative muscle weakness    PHYSICAL EXAM: Blood pressure (!) 136/93, pulse 92, temperature 98.3 F (36.8  C), temperature source Oral, height 5\' 6"  (1.676 m), weight 175 lb (79.4 kg), SpO2 96 %. Body mass index is 28.25 kg/m. Physical Exam  Constitutional: She is oriented to person, place, and time. She appears well-developed and well-nourished.  Cardiovascular: Normal rate.   Pulmonary/Chest: Effort normal.  Musculoskeletal: Normal range of motion.  Neurological: She is oriented to person, place, and time.  Skin: Skin is warm and dry.  Psychiatric: She has a normal mood and affect. Her behavior is normal.  Vitals reviewed.   RECENT LABS AND TESTS: BMET    Component Value Date/Time   NA 141 11/10/2016 0824   K 4.2 11/10/2016 0824   CL 103 11/10/2016  0824   CO2 23 11/10/2016 0824   GLUCOSE 83 11/10/2016 0824   BUN 17 11/10/2016 0824   CREATININE 0.85 11/10/2016 0824   CALCIUM 8.6 (L) 11/10/2016 0824   CALCIUM 7.5 (L) 01/03/2016 1520   GFRNONAA 82 11/10/2016 0824   GFRAA 95 11/10/2016 0824   Lab Results  Component Value Date   HGBA1C 5.6 11/10/2016   HGBA1C 5.5 08/13/2016   Lab Results  Component Value Date   INSULIN 5.6 11/10/2016   INSULIN 7.9 08/13/2016   CBC    Component Value Date/Time   WBC 7.2 08/13/2016 0928   RBC 4.45 08/13/2016 0928   HGB 12.4 08/13/2016 0928   HCT 40.8 08/13/2016 0928   MCV 92 08/13/2016 0928   MCH 27.9 08/13/2016 0928   MCHC 30.4 (L) 08/13/2016 0928   RDW 13.3 08/13/2016 0928   LYMPHSABS 2.4 08/13/2016 0928   EOSABS 0.1 08/13/2016 0928   BASOSABS 0.0 08/13/2016 0928   Iron/TIBC/Ferritin/ %Sat No results found for: IRON, TIBC, FERRITIN, IRONPCTSAT Lipid Panel     Component Value Date/Time   CHOL 193 11/10/2016 0824   TRIG 61 11/10/2016 0824   HDL 54 11/10/2016 0824   LDLCALC 127 (H) 11/10/2016 0824   Hepatic Function Panel     Component Value Date/Time   PROT 6.7 11/10/2016 0824   ALBUMIN 4.2 11/10/2016 0824   AST 15 11/10/2016 0824   ALT 12 11/10/2016 0824   ALKPHOS 48 11/10/2016 0824   BILITOT 0.2 11/10/2016 0824      Component Value Date/Time   TSH 2.080 08/13/2016 0928    ASSESSMENT AND PLAN: Vitamin D deficiency - Plan: Vitamin D, Ergocalciferol, (DRISDOL) 50000 units CAPS capsule  Hypocalcemia - Plan: Magnesium, Calcium  Class 1 obesity without serious comorbidity with body mass index (BMI) of 30.0 to 30.9 in adult, unspecified obesity type - Pt was greater than 30 BMI prior to starting the program  PLAN:  Vitamin D Deficiency [name] was informed that low vitamin D levels contributes to fatigue and are associated with obesity, breast, and colon cancer. She agrees to continue to take prescription Vit D @50 ,000 IU every week, we will refill for 1 month and will  follow up for routine testing of vitamin D, at least 2-3 times per year. She was informed of the risk of over-replacement of vitamin D and agrees to not increase her dose unless he discusses this with [name] first. [name] agrees to follow up with our clinic in 2 to 3 weeks.  Hypocalcemia [name] agrees to take calcium carbonate 500 mg 3 times per day #30 with no refills and we will re-check calcium and magnesium level. [name] agrees to follow up with our clinic in 2 to 3 weeks to discuss results.  At risk for osteopenia [name] is at risk for  osteopenia and osteoporsis due to her vitamin D deficiency. She was encouraged to take her vitamin D and follow her higher calcium diet and increase strengthening exercise to help strengthen her bones and decrease her risk of osteopenia and osteoporosis.  Obesity [name] is currently in the action stage of change. As such, her goal is to continue with weight loss efforts She has agreed to keep a food journal with 1200 to 1400 calories and 75+ grams of protein  [name] has been instructed to work up to a goal of 150 minutes of combined cardio and strengthening exercise per week for weight loss and overall health benefits. We discussed the following Behavioral Modification Strategies today: increasing lean protein intake and meal planning & cooking strategies  [name] has agreed to follow up with our clinic in 2 to 3 weeks. She was informed of the importance of frequent follow up visits to maximize her success with intensive lifestyle modifications for her multiple health conditions.  I, Doreene Nest, am acting as transcriptionist for Dennard Nip, MD  I have reviewed the above documentation for accuracy and completeness, and I agree with the above. -Dennard Nip, MD   OBESITY BEHAVIORAL INTERVENTION VISIT  Today's visit was # 10 out of 22.  Starting weight: 188 lbs Starting date: 08/13/16 Today's weight : 175 lbs Today's date: 12/22/2016 Total lbs lost to date:  13 (Patients must lose 7 lbs in the first 6 months to continue with counseling)   ASK: We discussed the diagnosis of obesity with [name] today and [name] agreed to give [name] permission to discuss obesity behavioral modification therapy today.  ASSESS: [name] has the diagnosis of obesity and her BMI today is 28.3 [name] is in the action stage of change   ADVISE: [name] was educated on the multiple health risks of obesity as well as the benefit of weight loss to improve her health. She was advised of the need for long term treatment and the importance of lifestyle modifications.  AGREE: Multiple dietary modification options and treatment options were discussed and  [name] agreed to keep a food journal with 1200 to 1400 calories and 75+ grams of protein  We discussed the following Behavioral Modification Strategies today: increasing lean protein intake and meal planning & cooking strategies

## 2016-12-24 LAB — MAGNESIUM: MAGNESIUM: 2.3 mg/dL (ref 1.6–2.3)

## 2016-12-24 LAB — CALCIUM: Calcium: 8.9 mg/dL (ref 8.7–10.2)

## 2016-12-25 ENCOUNTER — Encounter: Payer: 59 | Admitting: Podiatry

## 2016-12-25 LAB — MAGNESIUM

## 2016-12-25 LAB — CALCIUM

## 2017-01-01 ENCOUNTER — Encounter: Payer: 59 | Admitting: Podiatry

## 2017-01-07 DIAGNOSIS — I10 Essential (primary) hypertension: Secondary | ICD-10-CM | POA: Diagnosis not present

## 2017-01-07 DIAGNOSIS — Z8669 Personal history of other diseases of the nervous system and sense organs: Secondary | ICD-10-CM | POA: Diagnosis not present

## 2017-01-07 MED FILL — VALSARTAN 320 MG TAB: 320 | 90 days supply | Qty: 90 | Fill #0

## 2017-01-07 MED FILL — SUMATRIPTAN SUCC 50 MG TAB: 50 | 30 days supply | Qty: 18 | Fill #0

## 2017-01-11 ENCOUNTER — Ambulatory Visit (INDEPENDENT_AMBULATORY_CARE_PROVIDER_SITE_OTHER): Payer: 59 | Admitting: Family Medicine

## 2017-01-11 VITALS — BP 115/73 | HR 83 | Temp 98.2°F | Ht 66.0 in | Wt 175.0 lb

## 2017-01-11 DIAGNOSIS — Z9189 Other specified personal risk factors, not elsewhere classified: Secondary | ICD-10-CM | POA: Diagnosis not present

## 2017-01-11 DIAGNOSIS — E669 Obesity, unspecified: Secondary | ICD-10-CM | POA: Diagnosis not present

## 2017-01-11 DIAGNOSIS — E559 Vitamin D deficiency, unspecified: Secondary | ICD-10-CM

## 2017-01-11 DIAGNOSIS — Z6830 Body mass index (BMI) 30.0-30.9, adult: Secondary | ICD-10-CM | POA: Diagnosis not present

## 2017-01-11 MED ORDER — VITAMIN D (ERGOCALCIFEROL) 1.25 MG (50000 UNIT) PO CAPS: 50000.0000 [IU] | ORAL_CAPSULE | ORAL | 0 refills | Status: DC

## 2017-01-11 NOTE — Progress Notes (Signed)
Office: [phone]  /  Fax: [phone]   HPI:   Chief Complaint: OBESITY Marites is here to discuss her progress with her obesity treatment plan. She is on the  keep a food journal with 1200 to 1400 calories and 75+ grams of protein daily and is following her eating plan approximately 50 % of the time. She states she is exercising 0 minutes 0 times per week. Aalyiah has maintained weight well. She isn't journaling as often, mostly portion control/smart choices. She is dealing with family and co-worker sabotage well. Her weight is 175 lb (79.4 kg) today and has maintained weight over a period of 3 weeks since her last visit. She has lost 13 lbs since starting treatment with [name].  Vitamin D deficiency Imogean has a diagnosis of vitamin D deficiency. She is currently taking vit D and level is now at goal. She denies nausea, vomiting or muscle weakness.  At risk for osteopenia Alveta is at higher risk of osteopenia and osteoporosis due to vitamin D deficiency.    ALLERGIES: No Known Allergies  MEDICATIONS: Current Outpatient Prescriptions on File Prior to Visit  Medication Sig Dispense Refill  . buPROPion (WELLBUTRIN SR) 150 MG 12 hr tablet Take 1 tablet (150 mg total) by mouth every morning. (Patient taking differently: Take 75 mg by mouth daily. Take one half tablet (75mg ) by mouth once a day) 30 tablet 0  . calcium carbonate (TUMS) 500 MG chewable tablet Chew 1 tablet (200 mg of elemental calcium total) by mouth 3 (three) times daily. 90 tablet 0  . ibuprofen (ADVIL,MOTRIN) 400 MG tablet Take 1 tablet (400 mg total) by mouth 3 (three) times daily. 30 tablet 0  . magnesium oxide (MAG-OX) 400 MG tablet Take 400 mg by mouth daily.    . Multiple Vitamin (MULTIVITAMIN) tablet Take 1 tablet by mouth daily.    . SUMAtriptan (IMITREX) 50 MG tablet Take 50 mg by mouth every 2 (two) hours as needed for migraine. May repeat in 2 hours if headache persists or recurs.    . valsartan (DIOVAN) 160 MG tablet  Take 80 mg by mouth daily.      No current facility-administered medications on file prior to visit.     PAST MEDICAL HISTORY: Past Medical History:  Diagnosis Date  . Anemia   . Asthma    as child  . Back pain   . GERD (gastroesophageal reflux disease)   . Headache   . Hypertension   . Joint pain   . Migraines   . Parathyroid abnormality ([place])   . Swelling    feet and legs  . Vitamin D deficiency     PAST SURGICAL HISTORY: Past Surgical History:  Procedure Laterality Date  . FOOT SURGERY Bilateral 2001  . PARATHYROIDECTOMY N/A 01/03/2016   Procedure: PARATHYROIDECTOMY;  Surgeon: Leta Baptist, MD;  Location: [facility];  Service: ENT;  Laterality: N/A;  . TUBAL LIGATION      SOCIAL HISTORY: Social History  Substance Use Topics  . Smoking status: Never Smoker  . Smokeless tobacco: Never Used  . Alcohol use No    FAMILY HISTORY: Family History  Problem Relation Age of Onset  . Hypertension Father   . Cancer Maternal Grandmother   . Heart disease Paternal Grandmother   . Cancer Paternal Grandmother     ROS: Review of Systems  Constitutional: Negative for weight loss.  Gastrointestinal: Negative for nausea and vomiting.  Musculoskeletal:       Negative muscle  weakness    PHYSICAL EXAM: Blood pressure 115/73, pulse 83, temperature 98.2 F (36.8 C), temperature source Oral, height 5\' 6"  (1.676 m), weight 175 lb (79.4 kg), SpO2 99 %. Body mass index is 28.25 kg/m. Physical Exam  Constitutional: She is oriented to person, place, and time. She appears well-developed and well-nourished.  Cardiovascular: Normal rate.   Pulmonary/Chest: Effort normal.  Musculoskeletal: Normal range of motion.  Neurological: She is oriented to person, place, and time.  Skin: Skin is warm and dry.  Psychiatric: She has a normal mood and affect. Her behavior is normal.  Vitals reviewed.   RECENT LABS AND TESTS: BMET    Component Value Date/Time   NA 141  11/10/2016 0824   K 4.2 11/10/2016 0824   CL 103 11/10/2016 0824   CO2 23 11/10/2016 0824   GLUCOSE 83 11/10/2016 0824   BUN 17 11/10/2016 0824   CREATININE 0.85 11/10/2016 0824   CALCIUM 8.9 12/23/2016 0730   CALCIUM 7.5 (L) 01/03/2016 1520   GFRNONAA 82 11/10/2016 0824   GFRAA 95 11/10/2016 0824   Lab Results  Component Value Date   HGBA1C 5.6 11/10/2016   HGBA1C 5.5 08/13/2016   Lab Results  Component Value Date   INSULIN 5.6 11/10/2016   INSULIN 7.9 08/13/2016   CBC    Component Value Date/Time   WBC 7.2 08/13/2016 0928   RBC 4.45 08/13/2016 0928   HGB 12.4 08/13/2016 0928   HCT 40.8 08/13/2016 0928   MCV 92 08/13/2016 0928   MCH 27.9 08/13/2016 0928   MCHC 30.4 (L) 08/13/2016 0928   RDW 13.3 08/13/2016 0928   LYMPHSABS 2.4 08/13/2016 0928   EOSABS 0.1 08/13/2016 0928   BASOSABS 0.0 08/13/2016 0928   Iron/TIBC/Ferritin/ %Sat No results found for: IRON, TIBC, FERRITIN, IRONPCTSAT Lipid Panel     Component Value Date/Time   CHOL 193 11/10/2016 0824   TRIG 61 11/10/2016 0824   HDL 54 11/10/2016 0824   LDLCALC 127 (H) 11/10/2016 0824   Hepatic Function Panel     Component Value Date/Time   PROT 6.7 11/10/2016 0824   ALBUMIN 4.2 11/10/2016 0824   AST 15 11/10/2016 0824   ALT 12 11/10/2016 0824   ALKPHOS 48 11/10/2016 0824   BILITOT 0.2 11/10/2016 0824      Component Value Date/Time   TSH 2.080 08/13/2016 0928    ASSESSMENT AND PLAN: Vitamin D deficiency - Plan: Vitamin D, Ergocalciferol, (DRISDOL) 50000 units CAPS capsule  At risk for osteoporosis  Class 1 obesity without serious comorbidity with body mass index (BMI) of 30.0 to 30.9 in adult, unspecified obesity type - Starting BMI greater then 30  PLAN: Vitamin D Deficiency Wilhemina was informed that low vitamin D levels contributes to fatigue and are associated with obesity, breast, and colon cancer. She agrees to continue to take prescription Vit D @50 ,000 IU every week and will follow up for  routine testing of vitamin D, at least 2-3 times per year. She was informed of the risk of over-replacement of vitamin D and agrees to not increase her dose unless he discusses this with [name] first.  At risk for osteopenia Lanyiah is at risk for osteopenia and osteoporsis due to her vitamin D deficiency. She was encouraged to take her vitamin D and follow her higher calcium diet and increase strengthening exercise to help strengthen her bones and decrease her risk of osteopenia and osteoporosis.  Obesity My is currently in the action stage of change. As such, her goal  is to continue with weight loss efforts She has agreed to change to follow the Pescatarian eating plan Laia has been instructed to work up to a goal of 150 minutes of combined cardio and strengthening exercise per week or start walking or exercise bands for weight loss and overall health benefits. We discussed the following Behavioral Modification Strategies today: increase H2O intake, increasing lean protein intake and decrease eating out  Deleah has agreed to follow up with our clinic in 2 to 3 weeks. She was informed of the importance of frequent follow up visits to maximize her success with intensive lifestyle modifications for her multiple health conditions.  I, Doreene Nest, am acting as transcriptionist for Dennard Nip, MD  I have reviewed the above documentation for accuracy and completeness, and I agree with the above. -Dennard Nip, MD  OBESITY BEHAVIORAL INTERVENTION VISIT  Today's visit was # 11 out of 22.  Starting weight: 188 lbs Starting date: 08/13/16 Today's weight : 175 lbs Today's date: 01/11/2017 Total lbs lost to date: 13 (Patients must lose 7 lbs in the first 6 months to continue with counseling)   ASK: We discussed the diagnosis of obesity with [name] today and Thaily agreed to give [name] permission to discuss obesity behavioral modification therapy today.  ASSESS: Sherrine has the diagnosis of obesity and  her BMI today is 28.3 Genette is in the action stage of change   ADVISE: Addylin was educated on the multiple health risks of obesity as well as the benefit of weight loss to improve her health. She was advised of the need for long term treatment and the importance of lifestyle modifications.  AGREE: Multiple dietary modification options and treatment options were discussed and  Karolina agreed to change to follow the Pescatarian eating plan We discussed the following Behavioral Modification Strategies today: increase H2O intake, increasing lean protein intake and decrease eating out

## 2017-01-12 ENCOUNTER — Ambulatory Visit (INDEPENDENT_AMBULATORY_CARE_PROVIDER_SITE_OTHER): Payer: 59 | Admitting: Family Medicine

## 2017-01-21 ENCOUNTER — Telehealth (INDEPENDENT_AMBULATORY_CARE_PROVIDER_SITE_OTHER): Payer: Self-pay | Admitting: Family Medicine

## 2017-01-21 ENCOUNTER — Other Ambulatory Visit (INDEPENDENT_AMBULATORY_CARE_PROVIDER_SITE_OTHER): Payer: Self-pay

## 2017-01-21 DIAGNOSIS — F3289 Other specified depressive episodes: Secondary | ICD-10-CM

## 2017-01-21 MED ORDER — BUPROPION HCL ER (SR) 150 MG PO TB12: 150.0000 mg | ORAL_TABLET | Freq: Every morning | ORAL | 0 refills | Status: DC

## 2017-01-21 MED FILL — BUPROPION SR 150 MG TABLET: 150 | 10 days supply | Qty: 10 | Fill #0

## 2017-01-21 NOTE — Telephone Encounter (Signed)
Pt called in asking for a refill on buPROPion (WELLBUTRIN SR) 150 MG 12 hr tablet. States she will run out on Saturday. Next appt is July 30th. Asked to be called at work [phone]. Thank you

## 2017-01-21 NOTE — Telephone Encounter (Signed)
Dr. Leafy Ro ordered a refill for enough to cover [name] until 7/30, her next visit.  Thank You

## 2017-01-22 MED FILL — VIT D2 1.25 MG (50,000 UNIT: 1.25 MG | 28 days supply | Qty: 4 | Fill #0

## 2017-01-25 ENCOUNTER — Ambulatory Visit (INDEPENDENT_AMBULATORY_CARE_PROVIDER_SITE_OTHER): Payer: 59 | Admitting: Podiatry

## 2017-01-25 DIAGNOSIS — M722 Plantar fascial fibromatosis: Secondary | ICD-10-CM

## 2017-01-25 DIAGNOSIS — L600 Ingrowing nail: Secondary | ICD-10-CM

## 2017-01-25 MED FILL — AZITHROMYCIN 250 MG TABLET: 250 | 5 days supply | Qty: 6 | Fill #0

## 2017-01-26 NOTE — Progress Notes (Signed)
Subjective: [name] presents the office today for concerns of chronic ingrown toenail is been ongoing for the last 2 years. She points to the lateral aspect of the right hallux. She states the nails painful with pressure in shoes. She denies any redness or drainage or any swelling. She also states that she needs to wear supportive shoes off the heel will start to hurt again she'll get arch pain. Denies any systemic complaints such as fevers, chills, nausea, vomiting. No acute changes since last appointment, and no other complaints at this time.   Objective: AAO x3, NAD DP/PT pulses palpable bilaterally, CRT less than 3 seconds There is incurvation along the lateral aspect of the right hallux toenail with tenderness to palpation. His no edema, erythema or increase in warmth. There is no clinical signs of infection present. At this time there is no tenderness palpation along the course or insertion of the plantar fascia. There is no area pinpoint bony tenderness. No open lesions or pre-ulcerative lesions.  No pain with calf compression, swelling, warmth, erythema  Assessment:  Right hallux ingrown toenail with history of plantar fasciitis  Plan: -All treatment options discussed with the patient including all alternatives, risks, complications.  -At this time, the patient is requesting partial nail removal with chemical matricectomy to the symptomatic portion of the nail. Risks and complications were discussed with the patient for which they understand and  verbally consent to the procedure. Under sterile conditions a total of 3 mL of a mixture of 2% lidocaine plain and 0.5% Marcaine plain was infiltrated in a hallux block fashion. Once anesthetized, the skin was prepped in sterile fashion. A tourniquet was then applied. Next the right aspect of hallux nail border was then sharply excised making sure to remove the entire offending nail border. Once the nails were ensured to be removed area was debrided and  the underlying skin was intact. There is no purulence identified in the procedure. Next phenol was then applied under standard conditions and copiously irrigated. Silvadene was applied. A dry sterile dressing was applied. After application of the dressing the tourniquet was removed and there is found to be an immediate capillary refill time to the digit. The patient tolerated the procedure well any complications. Post procedure instructions were discussed the patient for which he verbally understood. Follow-up in one week for nail check or sooner if any problems are to arise. Discussed signs/symptoms of infection and directed to call the office immediately should any occur or go directly to the emergency room. In the meantime, encouraged to call the office with any questions, concerns, changes symptoms. -Recommended continue supportive shoe gear to help evaluate reoccurrence of plantar fasciitis. I believe that is medically necessary for her to wear supportive sneakers. She's been be purchasing new shoes in the near future. -Patient encouraged to call the office with any questions, concerns, change in symptoms.   Celesta Gentile, DPM

## 2017-02-01 ENCOUNTER — Ambulatory Visit (INDEPENDENT_AMBULATORY_CARE_PROVIDER_SITE_OTHER): Payer: 59 | Admitting: Family Medicine

## 2017-02-01 VITALS — BP 113/79 | HR 79 | Temp 98.3°F | Ht 66.0 in | Wt 171.0 lb

## 2017-02-01 DIAGNOSIS — Z6830 Body mass index (BMI) 30.0-30.9, adult: Secondary | ICD-10-CM | POA: Diagnosis not present

## 2017-02-01 DIAGNOSIS — F3289 Other specified depressive episodes: Secondary | ICD-10-CM | POA: Diagnosis not present

## 2017-02-01 DIAGNOSIS — E559 Vitamin D deficiency, unspecified: Secondary | ICD-10-CM

## 2017-02-01 DIAGNOSIS — E669 Obesity, unspecified: Secondary | ICD-10-CM | POA: Diagnosis not present

## 2017-02-01 MED ORDER — BUPROPION HCL ER (SR) 150 MG PO TB12: 150.0000 mg | ORAL_TABLET | Freq: Every morning | ORAL | 0 refills | Status: DC

## 2017-02-01 MED ORDER — VITAMIN D (ERGOCALCIFEROL) 1.25 MG (50000 UNIT) PO CAPS: 50000.0000 [IU] | ORAL_CAPSULE | ORAL | 0 refills | Status: DC

## 2017-02-01 MED FILL — BUPROPION SR 150 MG TABLET: 150 | 10 days supply | Qty: 10 | Fill #0

## 2017-02-01 NOTE — Progress Notes (Signed)
Office: [phone]  /  Fax: [phone]   HPI:   Chief Complaint: OBESITY [name] is here to discuss her progress with her obesity treatment plan. She is on the  follow the Pescatarian eating plan and is following her eating plan approximately 95 % of the time. She states she is stretching for 10 to 15 minutes 3 times per week. [name] has done well with the Pescatarian plan and she likes this plan better. Hunger is better controlled on this plan. Her weight is 171 lb (77.6 kg) today and has had a weight loss of 4 pounds over a period of 3 weeks since her last visit. She has lost 17 lbs since starting treatment with [name].  Depression with emotional eating behaviors [name]'s mood is stable on wellbutrin and she has decreased emotional eating. She denies insomnia and she feels decreased irritability as well. [name] struggles with emotional eating and using food for comfort to the extent that it is negatively impacting her health. She often snacks when she is not hungry. [name] sometimes feels she is out of control and then feels guilty that she made poor food choices. She has been working on behavior modification techniques to help reduce her emotional eating and has been somewhat successful. She shows no sign of suicidal or homicidal ideations.  Vitamin D deficiency [name] has a diagnosis of vitamin D deficiency. She is currently stable on vit D and denies nausea, vomiting or muscle weakness.  Depression screen PHQ 2/9 08/13/2016  Decreased Interest 1  Down, Depressed, Hopeless 2  PHQ - 2 Score 3  Altered sleeping 1  Tired, decreased energy 3  Change in appetite 3  Feeling bad or failure about yourself  1  Trouble concentrating 1  Moving slowly or fidgety/restless 1  Suicidal thoughts 0  PHQ-9 Score 13     ALLERGIES: No Known Allergies  MEDICATIONS: Current Outpatient Prescriptions on File Prior to Visit  Medication Sig Dispense Refill  . calcium carbonate (TUMS) 500 MG chewable tablet Chew  1 tablet (200 mg of elemental calcium total) by mouth 3 (three) times daily. 90 tablet 0  . ibuprofen (ADVIL,MOTRIN) 400 MG tablet Take 1 tablet (400 mg total) by mouth 3 (three) times daily. 30 tablet 0  . magnesium oxide (MAG-OX) 400 MG tablet Take 400 mg by mouth daily.    . Multiple Vitamin (MULTIVITAMIN) tablet Take 1 tablet by mouth daily.    . SUMAtriptan (IMITREX) 50 MG tablet Take 50 mg by mouth every 2 (two) hours as needed for migraine. May repeat in 2 hours if headache persists or recurs.    . valsartan (DIOVAN) 160 MG tablet Take 80 mg by mouth daily.      No current facility-administered medications on file prior to visit.     PAST MEDICAL HISTORY: Past Medical History:  Diagnosis Date  . Anemia   . Asthma    as child  . Back pain   . GERD (gastroesophageal reflux disease)   . Headache   . Hypertension   . Joint pain   . Migraines   . Parathyroid abnormality ([place])   . Swelling    feet and legs  . Vitamin D deficiency     PAST SURGICAL HISTORY: Past Surgical History:  Procedure Laterality Date  . FOOT SURGERY Bilateral 2001  . PARATHYROIDECTOMY N/A 01/03/2016   Procedure: PARATHYROIDECTOMY;  Surgeon: Leta Baptist, MD;  Location: [facility];  Service: ENT;  Laterality: N/A;  . TUBAL LIGATION  SOCIAL HISTORY: Social History  Substance Use Topics  . Smoking status: Never Smoker  . Smokeless tobacco: Never Used  . Alcohol use No    FAMILY HISTORY: Family History  Problem Relation Age of Onset  . Hypertension Father   . Cancer Maternal Grandmother   . Heart disease Paternal Grandmother   . Cancer Paternal Grandmother     ROS: Review of Systems  Constitutional: Positive for weight loss.  Gastrointestinal: Negative for nausea and vomiting.  Musculoskeletal:       Negative muscle weakness  Psychiatric/Behavioral: Positive for depression. Negative for suicidal ideas. The patient does not have insomnia.        Irritability    PHYSICAL  EXAM: Blood pressure 113/79, pulse 79, temperature 98.3 F (36.8 C), temperature source Oral, height 5\' 6"  (1.676 m), weight 171 lb (77.6 kg), SpO2 96 %. Body mass index is 27.6 kg/m. Physical Exam  Constitutional: She is oriented to person, place, and time. She appears well-developed and well-nourished.  Cardiovascular: Normal rate.   Pulmonary/Chest: Effort normal.  Musculoskeletal: Normal range of motion.  Neurological: She is oriented to person, place, and time.  Skin: Skin is warm and dry.  Psychiatric: She has a normal mood and affect. Her behavior is normal.  Vitals reviewed.   RECENT LABS AND TESTS: BMET    Component Value Date/Time   NA 141 11/10/2016 0824   K 4.2 11/10/2016 0824   CL 103 11/10/2016 0824   CO2 23 11/10/2016 0824   GLUCOSE 83 11/10/2016 0824   BUN 17 11/10/2016 0824   CREATININE 0.85 11/10/2016 0824   CALCIUM 8.9 12/23/2016 0730   CALCIUM 7.5 (L) 01/03/2016 1520   GFRNONAA 82 11/10/2016 0824   GFRAA 95 11/10/2016 0824   Lab Results  Component Value Date   HGBA1C 5.6 11/10/2016   HGBA1C 5.5 08/13/2016   Lab Results  Component Value Date   INSULIN 5.6 11/10/2016   INSULIN 7.9 08/13/2016   CBC    Component Value Date/Time   WBC 7.2 08/13/2016 0928   RBC 4.45 08/13/2016 0928   HGB 12.4 08/13/2016 0928   HCT 40.8 08/13/2016 0928   MCV 92 08/13/2016 0928   MCH 27.9 08/13/2016 0928   MCHC 30.4 (L) 08/13/2016 0928   RDW 13.3 08/13/2016 0928   LYMPHSABS 2.4 08/13/2016 0928   EOSABS 0.1 08/13/2016 0928   BASOSABS 0.0 08/13/2016 0928   Iron/TIBC/Ferritin/ %Sat No results found for: IRON, TIBC, FERRITIN, IRONPCTSAT Lipid Panel     Component Value Date/Time   CHOL 193 11/10/2016 0824   TRIG 61 11/10/2016 0824   HDL 54 11/10/2016 0824   LDLCALC 127 (H) 11/10/2016 0824   Hepatic Function Panel     Component Value Date/Time   PROT 6.7 11/10/2016 0824   ALBUMIN 4.2 11/10/2016 0824   AST 15 11/10/2016 0824   ALT 12 11/10/2016 0824    ALKPHOS 48 11/10/2016 0824   BILITOT 0.2 11/10/2016 0824      Component Value Date/Time   TSH 2.080 08/13/2016 0928    ASSESSMENT AND PLAN: Vitamin D deficiency - Plan: Vitamin D, Ergocalciferol, (DRISDOL) 50000 units CAPS capsule  Other depression - Plan: buPROPion (WELLBUTRIN SR) 150 MG 12 hr tablet  Class 1 obesity with serious comorbidity and body mass index (BMI) of 30.0 to 30.9 in adult, unspecified obesity type - Starting BMI greater than 30  PLAN:  Depression with Emotional Eating Behaviors We discussed behavior modification techniques today to help [name] deal with her emotional  eating and depression. She has agreed to continue Wellbutrin SR 150 mg qd, we will refill for 1 month and she agreed to follow up as directed.  Vitamin D Deficiency [name] was informed that low vitamin D levels contributes to fatigue and are associated with obesity, breast, and colon cancer. She agrees to continue to take prescription Vit D @50 ,000 IU every week, we will refill for 1 month and will follow up for routine testing of vitamin D, at least 2-3 times per year. She was informed of the risk of over-replacement of vitamin D and agrees to not increase her dose unless he discusses this with [name] first. [name] agrees to follow up with our clinic in 3 weeks.  Obesity [name] is currently in the action stage of change. As such, her goal is to continue with weight loss efforts She has agreed to follow the Pescatarian eating plan [name] has been instructed to work up to a goal of 150 minutes of combined cardio and strengthening exercise per week for weight loss and overall health benefits. We discussed the following Behavioral Modification Strategies today: increasing vegetables, increase H2O intake and meal planning & cooking strategies  [name] has agreed to follow up with our clinic in 3 weeks. She was informed of the importance of frequent follow up visits to maximize her success with intensive lifestyle  modifications for her multiple health conditions.  I, [name], am acting as transcriptionist for Dennard Nip, MD  I have reviewed the above documentation for accuracy and completeness, and I agree with the above. -Dennard Nip, MD  OBESITY BEHAVIORAL INTERVENTION VISIT  Today's visit was # 12 out of 22.  Starting weight: 188 lbs Starting date: 08/13/16 Today's weight : 171 lbs Today's date: 02/04/2017 Total lbs lost to date: 17 (Patients must lose 7 lbs in the first 6 months to continue with counseling)   ASK: We discussed the diagnosis of obesity with [name] today and [name] agreed to give [name] permission to discuss obesity behavioral modification therapy today.  ASSESS: [name] has the diagnosis of obesity and her BMI today is 27.7 [name] is in the action stage of change   ADVISE: [name] was educated on the multiple health risks of obesity as well as the benefit of weight loss to improve her health. She was advised of the need for long term treatment and the importance of lifestyle modifications.  AGREE: Multiple dietary modification options and treatment options were discussed and  [name] agreed to follow the Pescatarian eating plan We discussed the following Behavioral Modification Strategies today: increasing vegetables, increase H2O intake and meal planning & cooking strategies

## 2017-02-03 DIAGNOSIS — R070 Pain in throat: Secondary | ICD-10-CM | POA: Diagnosis not present

## 2017-02-03 DIAGNOSIS — J351 Hypertrophy of tonsils: Secondary | ICD-10-CM | POA: Diagnosis not present

## 2017-02-24 ENCOUNTER — Ambulatory Visit (INDEPENDENT_AMBULATORY_CARE_PROVIDER_SITE_OTHER): Payer: 59 | Admitting: Family Medicine

## 2017-02-24 VITALS — BP 94/65 | HR 75 | Temp 98.5°F | Ht 66.0 in | Wt 171.0 lb

## 2017-02-24 DIAGNOSIS — E785 Hyperlipidemia, unspecified: Secondary | ICD-10-CM | POA: Diagnosis not present

## 2017-02-24 DIAGNOSIS — E559 Vitamin D deficiency, unspecified: Secondary | ICD-10-CM

## 2017-02-24 DIAGNOSIS — Z6830 Body mass index (BMI) 30.0-30.9, adult: Secondary | ICD-10-CM

## 2017-02-24 DIAGNOSIS — I10 Essential (primary) hypertension: Secondary | ICD-10-CM | POA: Insufficient documentation

## 2017-02-24 DIAGNOSIS — F3289 Other specified depressive episodes: Secondary | ICD-10-CM | POA: Diagnosis not present

## 2017-02-24 DIAGNOSIS — E669 Obesity, unspecified: Secondary | ICD-10-CM

## 2017-02-24 DIAGNOSIS — F329 Major depressive disorder, single episode, unspecified: Secondary | ICD-10-CM | POA: Insufficient documentation

## 2017-02-24 DIAGNOSIS — F32A Depression, unspecified: Secondary | ICD-10-CM | POA: Insufficient documentation

## 2017-02-24 DIAGNOSIS — Z9189 Other specified personal risk factors, not elsewhere classified: Secondary | ICD-10-CM

## 2017-02-24 HISTORY — DX: Hyperlipidemia, unspecified: E78.5

## 2017-02-24 HISTORY — DX: Essential (primary) hypertension: I10

## 2017-02-24 HISTORY — DX: Depression, unspecified: F32.A

## 2017-02-24 MED ORDER — BUPROPION HCL ER (SR) 150 MG PO TB12: 150.0000 mg | ORAL_TABLET | Freq: Every morning | ORAL | 0 refills | Status: DC

## 2017-02-24 MED ORDER — VITAMIN D (ERGOCALCIFEROL) 1.25 MG (50000 UNIT) PO CAPS: 50000.0000 [IU] | ORAL_CAPSULE | ORAL | 0 refills | Status: DC

## 2017-02-24 MED FILL — VIT D2 1.25 MG (50,000 UNIT: 1.25 MG | 28 days supply | Qty: 4 | Fill #0

## 2017-02-24 MED FILL — BUPROPION SR 150 MG TABLET: 150 | 10 days supply | Qty: 10 | Fill #0

## 2017-02-24 NOTE — Progress Notes (Signed)
Office: [phone]  /  Fax: [phone]   HPI:   Chief Complaint: OBESITY [name] is here to discuss her progress with her obesity treatment plan. She is on the Pescatarian plan and is following her eating plan approximately 95 % of the time. She states she is exercising by walking  minutes 2 times per week. [name] has done well with maintaining her weight on the Pescatarian plan, her hunger is controlled and doing well with a decrease in her emotional eating.  Her weight is 171 lb (77.6 kg) today and has maintained her weight over a period of 2 weeks since her last visit. She has lost 17 lbs since starting treatment with [name].  Vitamin D deficiency [name] has a diagnosis of vitamin D deficiency. She is currently taking vit D and denies nausea, vomiting or muscle weakness. She is due for labs, will plan for labs and follow up.    Depression with emotional eating behaviors [name] has struggled with emotional eating and using food for comfort to the extent that it is negatively impacting her health in the past.  [name] 's mood is stable on Wellbutrin. She has been working on behavior modification techniques to help reduce her emotional eating and has been somewhat successful. She shows no sign of suicidal, homicidal ideations or insomnia .  Depression screen PHQ 2/9 08/13/2016  Decreased Interest 1  Down, Depressed, Hopeless 2  PHQ - 2 Score 3  Altered sleeping 1  Tired, decreased energy 3  Change in appetite 3  Feeling bad or failure about yourself  1  Trouble concentrating 1  Moving slowly or fidgety/restless 1  Suicidal thoughts 0  PHQ-9 Score 13   Hyperlipidemia [name] has hyperlipidemia and has been trying to improve her cholesterol levels with intensive lifestyle modification including a low saturated fat diet, exercise and weight loss. She denies any chest pain, claudication or myalgias.  Hypertension EMMRY HINSCH is a 46 y.o. female with hypertension.  [name] Pickup denies chest pain or  shortness of breath on exertion. She is working weight loss to help control her blood pressure with the goal of decreasing her risk of heart attack and stroke. [name] blood pressure has decrease and is currently controlled. She drin ks at least 80+ of H20 a day.  At risk for cardiovascular disease [name] is at a higher than average risk for cardiovascular disease due to obesity. She currently denies any chest pain.   ALLERGIES: No Known Allergies  MEDICATIONS: Current Outpatient Prescriptions on File Prior to Visit  Medication Sig Dispense Refill  . calcium carbonate (TUMS) 500 MG chewable tablet Chew 1 tablet (200 mg of elemental calcium total) by mouth 3 (three) times daily. 90 tablet 0  . ibuprofen (ADVIL,MOTRIN) 400 MG tablet Take 1 tablet (400 mg total) by mouth 3 (three) times daily. 30 tablet 0  . magnesium oxide (MAG-OX) 400 MG tablet Take 400 mg by mouth daily.    . Multiple Vitamin (MULTIVITAMIN) tablet Take 1 tablet by mouth daily.    . SUMAtriptan (IMITREX) 50 MG tablet Take 50 mg by mouth every 2 (two) hours as needed for migraine. May repeat in 2 hours if headache persists or recurs.    . valsartan (DIOVAN) 160 MG tablet Take 40 mg by mouth daily.      No current facility-administered medications on file prior to visit.     PAST MEDICAL HISTORY: Past Medical History:  Diagnosis Date  . Anemia   . Asthma  as child  . Back pain   . GERD (gastroesophageal reflux disease)   . Headache   . Hypertension   . Joint pain   . Migraines   . Parathyroid abnormality ([place])   . Swelling    feet and legs  . Vitamin D deficiency     PAST SURGICAL HISTORY: Past Surgical History:  Procedure Laterality Date  . FOOT SURGERY Bilateral 2001  . PARATHYROIDECTOMY N/A 01/03/2016   Procedure: PARATHYROIDECTOMY;  Surgeon: Leta Baptist, MD;  Location: [facility];  Service: ENT;  Laterality: N/A;  . TUBAL LIGATION      SOCIAL HISTORY: Social History  Substance Use Topics    . Smoking status: Never Smoker  . Smokeless tobacco: Never Used  . Alcohol use No    FAMILY HISTORY: Family History  Problem Relation Age of Onset  . Hypertension Father   . Cancer Maternal Grandmother   . Heart disease Paternal Grandmother   . Cancer Paternal Grandmother     ROS: Review of Systems  Constitutional: Positive for malaise/fatigue.  All other systems reviewed and are negative.   PHYSICAL EXAM: Blood pressure 94/65, pulse 75, temperature 98.5 F (36.9 C), temperature source Oral, height 5\' 6"  (1.676 m), weight 171 lb (77.6 kg), SpO2 98 %. Body mass index is 27.6 kg/m. Physical Exam  Constitutional: She is oriented to person, place, and time. She appears well-developed and well-nourished.  HENT:  Head: Normocephalic.  Eyes: EOM are normal.  Neck: Normal range of motion.  Pulmonary/Chest: Effort normal.  Neurological: She is alert and oriented to person, place, and time.  Skin: Skin is warm and dry.  Psychiatric: She has a normal mood and affect.  Vitals reviewed.   RECENT LABS AND TESTS: BMET    Component Value Date/Time   NA 141 11/10/2016 0824   K 4.2 11/10/2016 0824   CL 103 11/10/2016 0824   CO2 23 11/10/2016 0824   GLUCOSE 83 11/10/2016 0824   BUN 17 11/10/2016 0824   CREATININE 0.85 11/10/2016 0824   CALCIUM 8.9 12/23/2016 0730   CALCIUM 7.5 (L) 01/03/2016 1520   GFRNONAA 82 11/10/2016 0824   GFRAA 95 11/10/2016 0824   Lab Results  Component Value Date   HGBA1C 5.6 11/10/2016   HGBA1C 5.5 08/13/2016   Lab Results  Component Value Date   INSULIN 5.6 11/10/2016   INSULIN 7.9 08/13/2016   CBC    Component Value Date/Time   WBC 7.2 08/13/2016 0928   RBC 4.45 08/13/2016 0928   HGB 12.4 08/13/2016 0928   HCT 40.8 08/13/2016 0928   MCV 92 08/13/2016 0928   MCH 27.9 08/13/2016 0928   MCHC 30.4 (L) 08/13/2016 0928   RDW 13.3 08/13/2016 0928   LYMPHSABS 2.4 08/13/2016 0928   EOSABS 0.1 08/13/2016 0928   BASOSABS 0.0 08/13/2016 0928    Iron/TIBC/Ferritin/ %Sat No results found for: IRON, TIBC, FERRITIN, IRONPCTSAT Lipid Panel     Component Value Date/Time   CHOL 193 11/10/2016 0824   TRIG 61 11/10/2016 0824   HDL 54 11/10/2016 0824   LDLCALC 127 (H) 11/10/2016 0824   Hepatic Function Panel     Component Value Date/Time   PROT 6.7 11/10/2016 0824   ALBUMIN 4.2 11/10/2016 0824   AST 15 11/10/2016 0824   ALT 12 11/10/2016 0824   ALKPHOS 48 11/10/2016 0824   BILITOT 0.2 11/10/2016 0824      Component Value Date/Time   TSH 2.080 08/13/2016 0928    ASSESSMENT AND  PLAN: Vitamin D deficiency - Plan: Vitamin D, Ergocalciferol, (DRISDOL) 50000 units CAPS capsule, VITAMIN D 25 Hydroxy (Vit-D Deficiency, Fractures)  Other depression - Plan: buPROPion (WELLBUTRIN SR) 150 MG 12 hr tablet  Hyperlipidemia, unspecified hyperlipidemia type - Plan: CBC with Differential/Platelet, Hemoglobin A1c, Insulin, random, Lipid Panel With LDL/HDL Ratio, Vitamin B12, Folate, TSH, T4, free, T3  Essential hypertension - Plan: Comprehensive metabolic panel  At risk for heart disease  Class 1 obesity without serious comorbidity with body mass index (BMI) of 30.0 to 30.9 in adult, unspecified obesity type - patient BMI >30 when she began the program  PLAN: Vitamin D Deficiency Aaliyan was informed that low vitamin D levels contributes to fatigue and are associated with obesity, breast, and colon cancer. She agrees to continue to take prescription Vit D @50 ,000 IU every week in which was refilled today for 30 day supply with 0 refills and will follow up for routine testing of vitamin D, at least 2-3 times per year. She was informed of the risk of over-replacement of vitamin D and agrees to not increase her dose unless he discusses this with [name] first.  Depression with Emotional Eating Behaviors We discussed behavior modification techniques today to help Ryenne deal with her emotional eating and depression. She has agreed to continue take  Wellbutrin SR 150 mg qd in which was refilled today for a 30 day supply and agreed to follow up as directed.  Hyperlipidemia Lorilei was informed of the American Heart Association Guidelines emphasizing intensive lifestyle modifications as the first line treatment for hyperlipidemia. We discussed many lifestyle modifications today in depth, and Deidra will continue to work on decreasing saturated fats such as fatty red meat, butter and many fried foods. She will also increase vegetables and lean protein in her diet and continue to work on exercise and weight loss efforts. Will check labs in the near future and follow up.   Hypertension We discussed sodium restriction, working on healthy weight loss, and a regular exercise program as the means to achieve improved blood pressure control. Yajahira agreed with this plan and agreed to follow up as directed. We will continue to monitor her blood pressure as well as her progress with the above lifestyle modifications. She will decrease her Valsartan to 1/2 dose will watch for signs of hypertension as she continues her lifestyle modifications. Will recheck her blood pressure in 2 weeks.  Cardiovascular risk counselling Magie was given extended (15 minutes) coronary artery disease prevention counseling today. She is 46 y.o. female and has risk factors for heart disease including obesity. We discussed intensive lifestyle modifications today with an emphasis on specific weight loss instructions and strategies. Pt was also informed of the importance of increasing exercise and decreasing saturated fats to help prevent heart disease.  Obesity Maila is currently in the action stage of change. As such, her goal is to continue with weight loss efforts She has agreed to follow our protein rich vegetarian plan Irlanda has been instructed to work up to a goal of 150 minutes of combined cardio and strengthening exercise per week for weight loss and overall health benefits. She has  agreed to increase her strengthening exercises using wall push ups and dips We discussed the following Behavioral Modification Stratagies today: work on meal planning and easy cooking plans and dealing with family or coworker sabotage Priscella's goal is to lose more then 5lbs to get her fat % below 35%.   Amberlin has agreed to follow up with our clinic  in 2 weeks. She was informed of the importance of frequent follow up visits to maximize her success with intensive lifestyle modifications for her multiple health conditions.  I, April Moore , am acting as Location manager for Dennard Nip, MD  I have reviewed the above documentation for accuracy and completeness, and I agree with the above. -Dennard Nip, MD

## 2017-03-05 DIAGNOSIS — R5383 Other fatigue: Secondary | ICD-10-CM | POA: Diagnosis not present

## 2017-03-05 DIAGNOSIS — E210 Primary hyperparathyroidism: Secondary | ICD-10-CM | POA: Diagnosis not present

## 2017-03-05 DIAGNOSIS — E559 Vitamin D deficiency, unspecified: Secondary | ICD-10-CM | POA: Diagnosis not present

## 2017-03-05 DIAGNOSIS — I10 Essential (primary) hypertension: Secondary | ICD-10-CM | POA: Diagnosis not present

## 2017-03-11 ENCOUNTER — Ambulatory Visit (INDEPENDENT_AMBULATORY_CARE_PROVIDER_SITE_OTHER): Payer: 59 | Admitting: Physician Assistant

## 2017-03-11 VITALS — BP 112/74 | HR 78 | Temp 98.1°F | Ht 66.0 in | Wt 173.0 lb

## 2017-03-11 DIAGNOSIS — Z9189 Other specified personal risk factors, not elsewhere classified: Secondary | ICD-10-CM

## 2017-03-11 DIAGNOSIS — F3289 Other specified depressive episodes: Secondary | ICD-10-CM | POA: Diagnosis not present

## 2017-03-11 DIAGNOSIS — E559 Vitamin D deficiency, unspecified: Secondary | ICD-10-CM

## 2017-03-11 DIAGNOSIS — Z6830 Body mass index (BMI) 30.0-30.9, adult: Secondary | ICD-10-CM | POA: Diagnosis not present

## 2017-03-11 DIAGNOSIS — E669 Obesity, unspecified: Secondary | ICD-10-CM | POA: Diagnosis not present

## 2017-03-11 IMAGING — MG MM SCREEN MAMMOGRAM BILATERAL
4 series · 4 of 4 positions shown · non-contrast
Comparison: Previous exam(s).

CLINICAL DATA: Screening.

EXAM:
DIGITAL SCREENING BILATERAL MAMMOGRAM WITH CAD

[R CC]
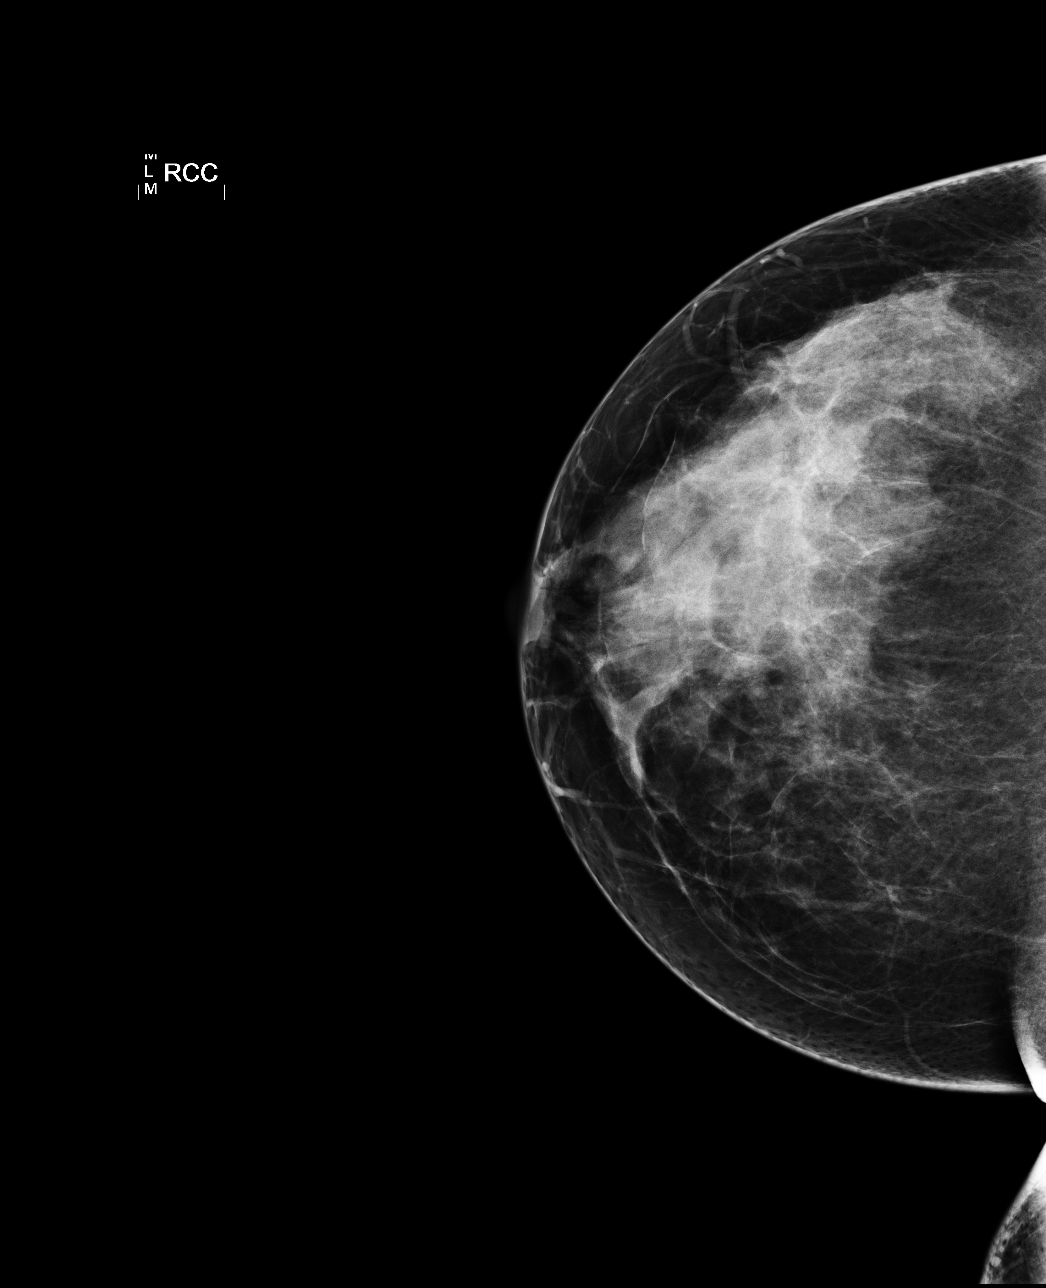

[L CC]
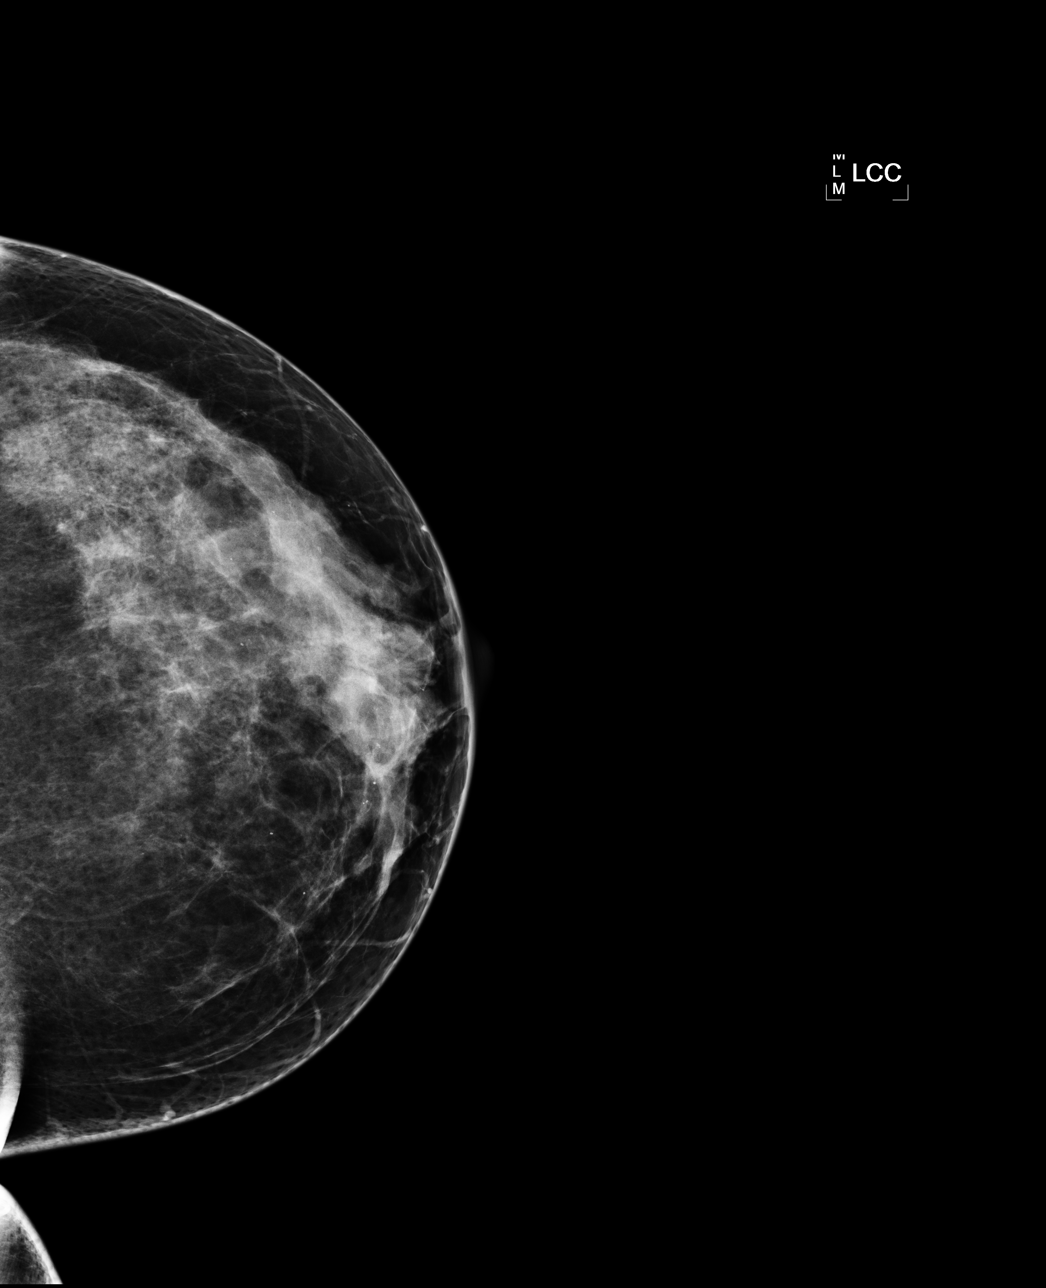

[L MLO]
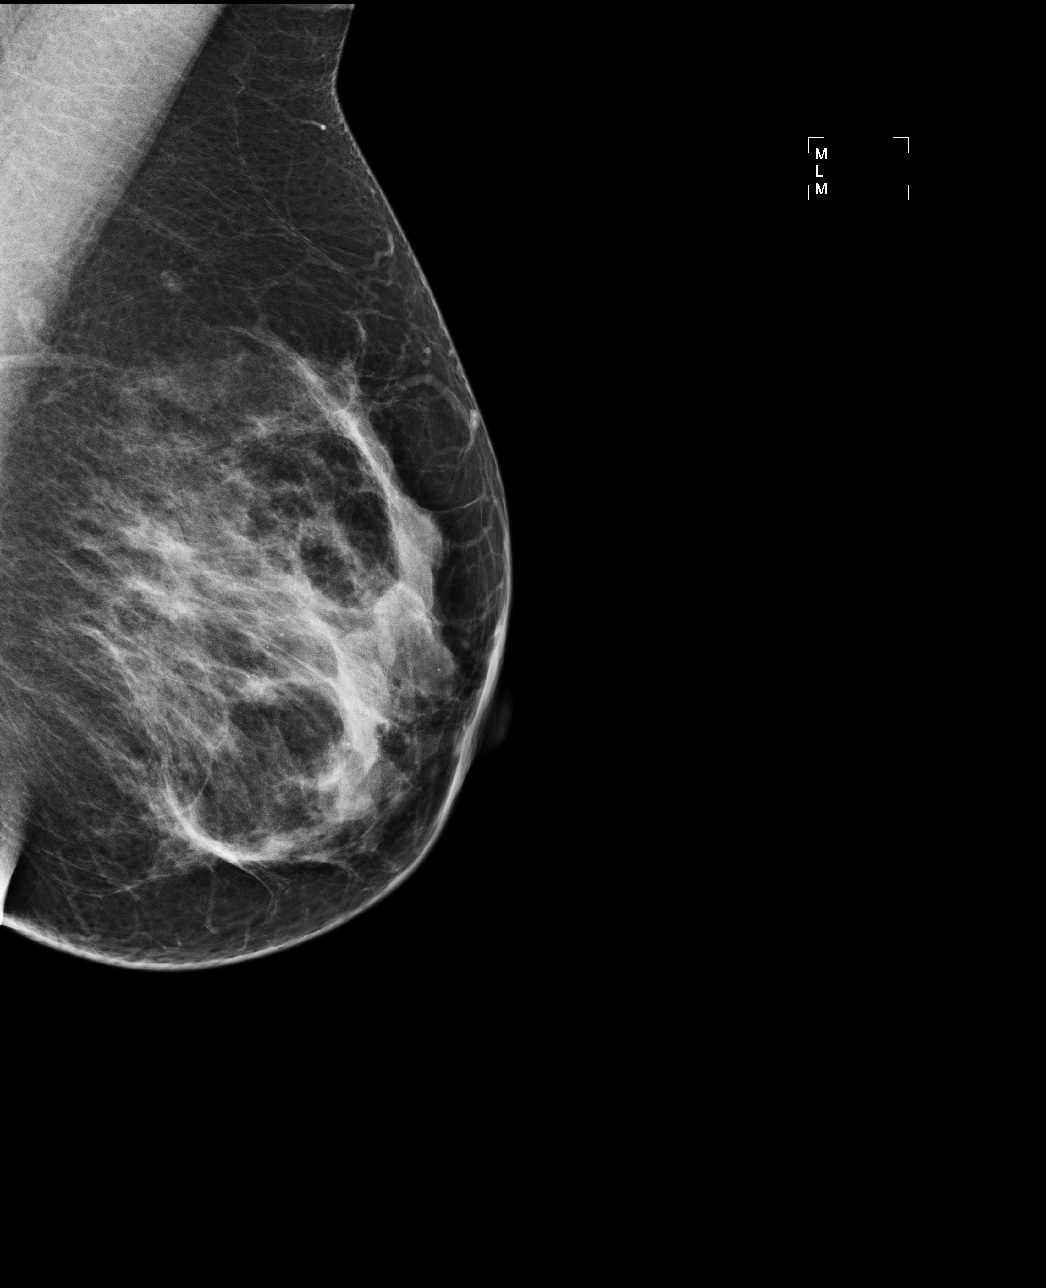

[R MLO]
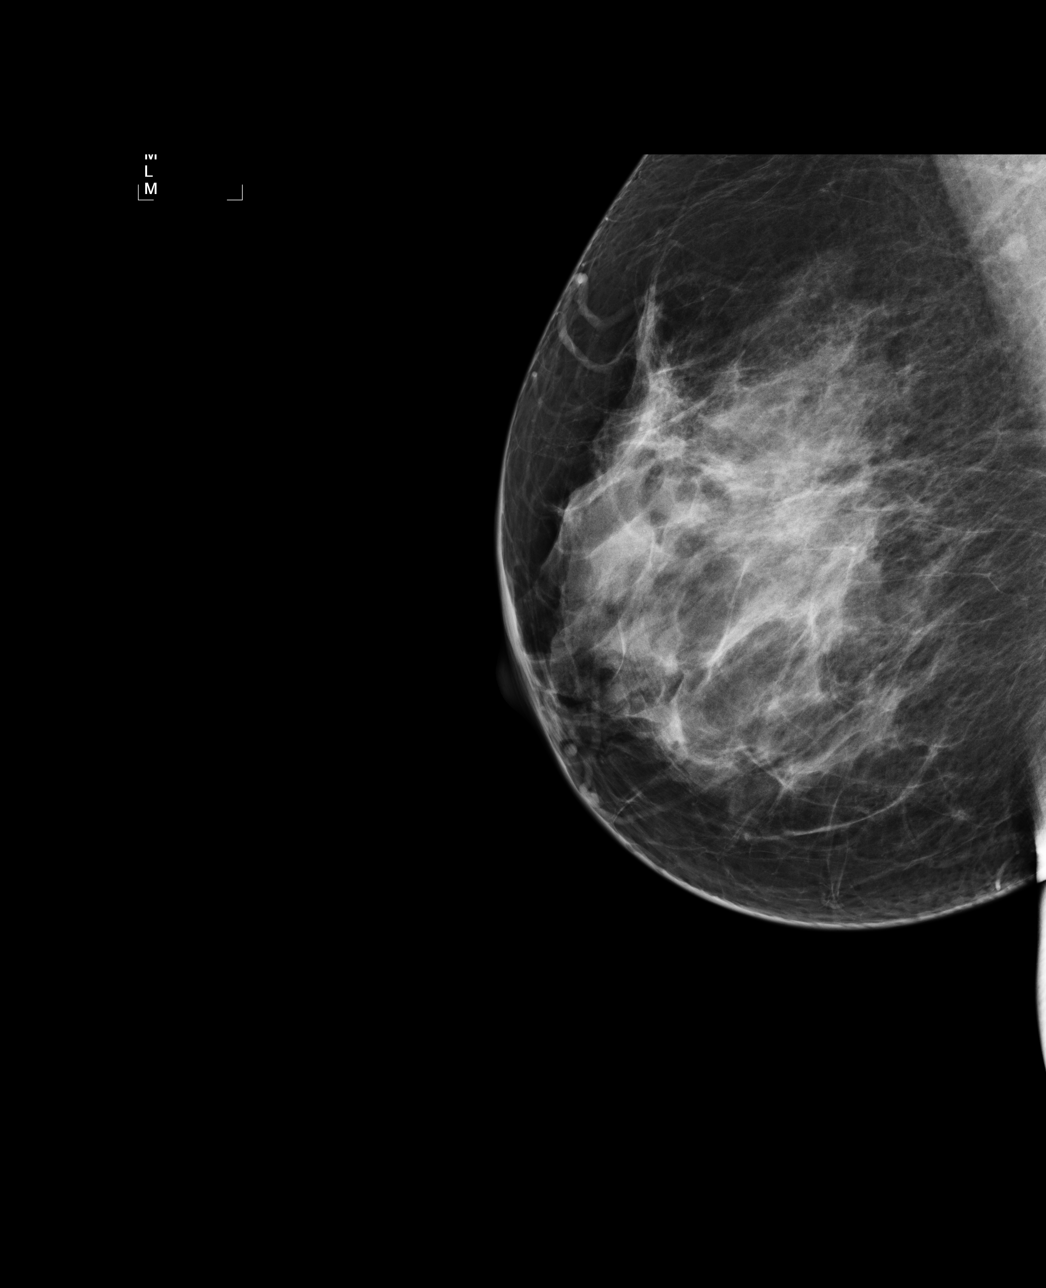

[4 of 4 positions shown; findings below may reference images not displayed]

ACR Breast Density Category c: The breast tissue is heterogeneously
dense, which may obscure small masses.
FINDINGS: There are no findings suspicious for malignancy. Images were
processed with CAD.
IMPRESSION: No mammographic evidence of malignancy. A result letter of this
screening mammogram will be mailed directly to the patient.

RECOMMENDATION:
Screening mammogram in one year. (Code:[id number])

BI-RADS CATEGORY  1: Negative.

## 2017-03-11 MED ORDER — BUPROPION HCL ER (SR) 150 MG PO TB12: 150.0000 mg | ORAL_TABLET | Freq: Every morning | ORAL | 0 refills | Status: DC

## 2017-03-11 MED ORDER — VITAMIN D (ERGOCALCIFEROL) 1.25 MG (50000 UNIT) PO CAPS: 50000.0000 [IU] | ORAL_CAPSULE | ORAL | 0 refills | Status: DC

## 2017-03-11 MED FILL — BUPROPION SR 150 MG TABLET: 150 | 30 days supply | Qty: 30 | Fill #0

## 2017-03-11 NOTE — Progress Notes (Signed)
Office: [phone]  /  Fax: [phone]   HPI:   Chief Complaint: OBESITY [name] is here to discuss her progress with her obesity treatment plan. She is on the  follow the Pescatarian eating plan and is following her eating plan approximately 95 % of the time. She states she is exercising wall exercises for 40 minutes 5 times per week. [name] had increased celebration eating and has been snacking more however, she is mindful of her choices. Her weight is 173 lb (78.5 kg) today and has had a weight gain of 2 pounds over a period of 2 weeks since her last visit. She has lost 15 lbs since starting treatment with [name].  Vitamin D deficiency [name] has a diagnosis of vitamin D deficiency. She is currently taking vit D and denies nausea, vomiting or muscle weakness.  At risk for osteopenia and osteoporosis [name] is at higher risk of osteopenia and osteoporosis due to vitamin D deficiency.   Depression with emotional eating behaviors [name] is struggling with emotional eating and using food for comfort to the extent that it is negatively impacting her health. She often snacks when she is not hungry. [name] sometimes feels she is out of control and then feels guilty that she made poor food choices. She has been working on behavior modification techniques to help reduce her emotional eating and has been somewhat successful. Her mood is stable and she shows no sign of suicidal or homicidal ideations.  Depression screen PHQ 2/9 08/13/2016  Decreased Interest 1  Down, Depressed, Hopeless 2  PHQ - 2 Score 3  Altered sleeping 1  Tired, decreased energy 3  Change in appetite 3  Feeling bad or failure about yourself  1  Trouble concentrating 1  Moving slowly or fidgety/restless 1  Suicidal thoughts 0  PHQ-9 Score 13      ALLERGIES: No Known Allergies  MEDICATIONS: Current Outpatient Prescriptions on File Prior to Visit  Medication Sig Dispense Refill  . buPROPion (WELLBUTRIN SR) 150 MG 12 hr tablet  Take 1 tablet (150 mg total) by mouth every morning. 10 tablet 0  . calcium carbonate (TUMS) 500 MG chewable tablet Chew 1 tablet (200 mg of elemental calcium total) by mouth 3 (three) times daily. 90 tablet 0  . ibuprofen (ADVIL,MOTRIN) 400 MG tablet Take 1 tablet (400 mg total) by mouth 3 (three) times daily. 30 tablet 0  . magnesium oxide (MAG-OX) 400 MG tablet Take 400 mg by mouth daily.    . Multiple Vitamin (MULTIVITAMIN) tablet Take 1 tablet by mouth daily.    . SUMAtriptan (IMITREX) 50 MG tablet Take 50 mg by mouth every 2 (two) hours as needed for migraine. May repeat in 2 hours if headache persists or recurs.    . valsartan (DIOVAN) 160 MG tablet Take 40 mg by mouth daily.     . Vitamin D, Ergocalciferol, (DRISDOL) 50000 units CAPS capsule Take 1 capsule (50,000 Units total) by mouth every 7 (seven) days. 4 capsule 0   No current facility-administered medications on file prior to visit.     PAST MEDICAL HISTORY: Past Medical History:  Diagnosis Date  . Anemia   . Asthma    as child  . Back pain   . GERD (gastroesophageal reflux disease)   . Headache   . Hypertension   . Joint pain   . Migraines   . Parathyroid abnormality ([place])   . Swelling    feet and legs  . Vitamin D deficiency  PAST SURGICAL HISTORY: Past Surgical History:  Procedure Laterality Date  . FOOT SURGERY Bilateral 2001  . PARATHYROIDECTOMY N/A 01/03/2016   Procedure: PARATHYROIDECTOMY;  Surgeon: Leta Baptist, MD;  Location: [facility];  Service: ENT;  Laterality: N/A;  . TUBAL LIGATION      SOCIAL HISTORY: Social History  Substance Use Topics  . Smoking status: Never Smoker  . Smokeless tobacco: Never Used  . Alcohol use No    FAMILY HISTORY: Family History  Problem Relation Age of Onset  . Hypertension Father   . Cancer Maternal Grandmother   . Heart disease Paternal Grandmother   . Cancer Paternal Grandmother     ROS: Review of Systems  Constitutional: Negative for  weight loss.  Gastrointestinal: Negative for nausea and vomiting.  Musculoskeletal:       Negative muscle weakness  Psychiatric/Behavioral: Positive for depression. Negative for suicidal ideas.    PHYSICAL EXAM: Blood pressure 112/74, pulse 78, temperature 98.1 F (36.7 C), temperature source Oral, height 5\' 6"  (1.676 m), weight 173 lb (78.5 kg), last menstrual period 12/15/2016, SpO2 96 %. Body mass index is 27.92 kg/m. Physical Exam  Constitutional: She is oriented to person, place, and time. She appears well-developed and well-nourished.  Cardiovascular: Normal rate.   Pulmonary/Chest: Effort normal.  Musculoskeletal: Normal range of motion.  Neurological: She is oriented to person, place, and time.  Skin: Skin is warm and dry.  Psychiatric: She has a normal mood and affect. Her behavior is normal.  Vitals reviewed.   RECENT LABS AND TESTS: BMET    Component Value Date/Time   NA 141 11/10/2016 0824   K 4.2 11/10/2016 0824   CL 103 11/10/2016 0824   CO2 23 11/10/2016 0824   GLUCOSE 83 11/10/2016 0824   BUN 17 11/10/2016 0824   CREATININE 0.85 11/10/2016 0824   CALCIUM 8.9 12/23/2016 0730   CALCIUM 7.5 (L) 01/03/2016 1520   GFRNONAA 82 11/10/2016 0824   GFRAA 95 11/10/2016 0824   Lab Results  Component Value Date   HGBA1C 5.6 11/10/2016   HGBA1C 5.5 08/13/2016   Lab Results  Component Value Date   INSULIN 5.6 11/10/2016   INSULIN 7.9 08/13/2016   CBC    Component Value Date/Time   WBC 7.2 08/13/2016 0928   RBC 4.45 08/13/2016 0928   HGB 12.4 08/13/2016 0928   HCT 40.8 08/13/2016 0928   MCV 92 08/13/2016 0928   MCH 27.9 08/13/2016 0928   MCHC 30.4 (L) 08/13/2016 0928   RDW 13.3 08/13/2016 0928   LYMPHSABS 2.4 08/13/2016 0928   EOSABS 0.1 08/13/2016 0928   BASOSABS 0.0 08/13/2016 0928   Iron/TIBC/Ferritin/ %Sat No results found for: IRON, TIBC, FERRITIN, IRONPCTSAT Lipid Panel     Component Value Date/Time   CHOL 193 11/10/2016 0824   TRIG 61  11/10/2016 0824   HDL 54 11/10/2016 0824   LDLCALC 127 (H) 11/10/2016 0824   Hepatic Function Panel     Component Value Date/Time   PROT 6.7 11/10/2016 0824   ALBUMIN 4.2 11/10/2016 0824   AST 15 11/10/2016 0824   ALT 12 11/10/2016 0824   ALKPHOS 48 11/10/2016 0824   BILITOT 0.2 11/10/2016 0824      Component Value Date/Time   TSH 2.080 08/13/2016 0928    ASSESSMENT AND PLAN: Vitamin D deficiency - Plan: Vitamin D, Ergocalciferol, (DRISDOL) 50000 units CAPS capsule  Other depression - Plan: buPROPion (WELLBUTRIN SR) 150 MG 12 hr tablet  At risk for osteoporosis  Class  1 obesity with serious comorbidity and body mass index (BMI) of 30.0 to 30.9 in adult, unspecified obesity type - Starting BMI greater than 30  PLAN:  Vitamin D Deficiency [name] was informed that low vitamin D levels contributes to fatigue and are associated with obesity, breast, and colon cancer. She agrees to continue to take prescription Vit D @50 ,000 IU every week, we will refill for 1 month and will follow up for routine testing of vitamin D, at least 2-3 times per year. She was informed of the risk of over-replacement of vitamin D and agrees to not increase her dose unless he discusses this with [name] first.  At risk for osteopenia and osteoporosis [name] is at risk for osteopenia and osteoporosis due to her vitamin D deficiency. She was encouraged to take her vitamin D and follow her higher calcium diet and increase strengthening exercise to help strengthen her bones and decrease her risk of osteopenia and osteoporosis.  Depression with Emotional Eating Behaviors We discussed behavior modification techniques today to help [name] deal with her emotional eating and depression. She has agreed to continue Wellbutrin SR 150 mg qd #30 with no refills and will follow up as directed.  Obesity [name] is currently in the action stage of change. As such, her goal is to continue with weight loss efforts She has agreed to  follow the Pescatarian eating plan [name] has been instructed to work up to a goal of 150 minutes of combined cardio and strengthening exercise per week for weight loss and overall health benefits. We discussed the following Behavioral Modification Strategies today: increasing lean protein intake and work on meal planning and easy cooking plans  [name] has agreed to follow up with our clinic in 3 weeks. She was informed of the importance of frequent follow up visits to maximize her success with intensive lifestyle modifications for her multiple health conditions.  I, [name], am acting as transcriptionist for Anna Duverney, PA-C  I have reviewed the above documentation for accuracy and completeness, and I agree with the above. -Anna Duverney, PA-C  I have reviewed the above note and agree with the plan. -Anna Nip, MD   OBESITY BEHAVIORAL INTERVENTION VISIT  Today's visit was # 14 out of 22.  Starting weight: 188 lbs Starting date: 08/13/16 Today's weight : 173 lbs Today's date: 03/11/2017 Total lbs lost to date: 15 (Patients must lose 7 lbs in the first 6 months to continue with counseling)   ASK: We discussed the diagnosis of obesity with [name] today and [name] agreed to give [name] permission to discuss obesity behavioral modification therapy today.  ASSESS: [name] has the diagnosis of obesity and her BMI today is 27.94 [name] is in the action stage of change   ADVISE: [name] was educated on the multiple health risks of obesity as well as the benefit of weight loss to improve her health. She was advised of the need for long term treatment and the importance of lifestyle modifications.  AGREE: Multiple dietary modification options and treatment options were discussed and  [name] agreed to follow the Pescatarian eating plan We discussed the following Behavioral Modification Strategies today: increasing lean protein intake and work on meal planning and easy cooking plans

## 2017-03-18 MED FILL — VIT D2 1.25 MG (50,000 UNIT: 1.25 MG | 28 days supply | Qty: 4 | Fill #0

## 2017-03-29 ENCOUNTER — Ambulatory Visit (INDEPENDENT_AMBULATORY_CARE_PROVIDER_SITE_OTHER): Payer: 59 | Admitting: Physician Assistant

## 2017-03-29 ENCOUNTER — Encounter (INDEPENDENT_AMBULATORY_CARE_PROVIDER_SITE_OTHER): Payer: Self-pay | Admitting: Physician Assistant

## 2017-03-29 VITALS — BP 123/76 | HR 71 | Temp 97.7°F | Ht 66.0 in | Wt 175.0 lb

## 2017-03-29 DIAGNOSIS — Z6830 Body mass index (BMI) 30.0-30.9, adult: Secondary | ICD-10-CM | POA: Diagnosis not present

## 2017-03-29 DIAGNOSIS — I10 Essential (primary) hypertension: Secondary | ICD-10-CM | POA: Diagnosis not present

## 2017-03-29 DIAGNOSIS — E669 Obesity, unspecified: Secondary | ICD-10-CM | POA: Diagnosis not present

## 2017-03-30 NOTE — Progress Notes (Signed)
Office: [phone]  /  Fax: [phone]   HPI:   Chief Complaint: OBESITY Teresina is here to discuss her progress with her obesity treatment plan. She is on the  follow our [place] and is following her eating plan approximately 95 % of the time. She states she is exercising 45 minutes 3 times per week. Rainy has been exercising more and feels hunger is not well controlled. She has been following the plan.  Her weight is 175 lb (79.4 kg) today and has a weight increase of 2 pounds since her last visit. She has lost 13 lbs since starting treatment with [name].  Hypertension ORETHA WEISMANN is a 46 y.o. female with hypertension.  Darris Carachure Buonomo denies chest pain or shortness of breath on exertion. She is working weight loss to help control her blood pressure with the goal of decreasing her risk of heart attack and stroke. [name] blood pressure is currently controlled.    ALLERGIES: No Known Allergies  MEDICATIONS: Current Outpatient Prescriptions on File Prior to Visit  Medication Sig Dispense Refill  . buPROPion (WELLBUTRIN SR) 150 MG 12 hr tablet Take 1 tablet (150 mg total) by mouth every morning. 30 tablet 0  . calcium carbonate (TUMS) 500 MG chewable tablet Chew 1 tablet (200 mg of elemental calcium total) by mouth 3 (three) times daily. 90 tablet 0  . ibuprofen (ADVIL,MOTRIN) 400 MG tablet Take 1 tablet (400 mg total) by mouth 3 (three) times daily. 30 tablet 0  . magnesium oxide (MAG-OX) 400 MG tablet Take 400 mg by mouth daily.    . Multiple Vitamin (MULTIVITAMIN) tablet Take 1 tablet by mouth daily.    . SUMAtriptan (IMITREX) 50 MG tablet Take 50 mg by mouth every 2 (two) hours as needed for migraine. May repeat in 2 hours if headache persists or recurs.    . valsartan (DIOVAN) 160 MG tablet Take 40 mg by mouth daily.     . Vitamin D, Ergocalciferol, (DRISDOL) 50000 units CAPS capsule Take 1 capsule (50,000 Units total) by mouth every 7 (seven) days. 4 capsule 0   No current  facility-administered medications on file prior to visit.     PAST MEDICAL HISTORY: Past Medical History:  Diagnosis Date  . Anemia   . Asthma    as child  . Back pain   . GERD (gastroesophageal reflux disease)   . Headache   . Hypertension   . Joint pain   . Migraines   . Parathyroid abnormality ([place])   . Swelling    feet and legs  . Vitamin D deficiency     PAST SURGICAL HISTORY: Past Surgical History:  Procedure Laterality Date  . FOOT SURGERY Bilateral 2001  . PARATHYROIDECTOMY N/A 01/03/2016   Procedure: PARATHYROIDECTOMY;  Surgeon: Leta Baptist, MD;  Location: [facility];  Service: ENT;  Laterality: N/A;  . TUBAL LIGATION      SOCIAL HISTORY: Social History  Substance Use Topics  . Smoking status: Never Smoker  . Smokeless tobacco: Never Used  . Alcohol use No    FAMILY HISTORY: Family History  Problem Relation Age of Onset  . Hypertension Father   . Cancer Maternal Grandmother   . Heart disease Paternal Grandmother   . Cancer Paternal Grandmother     ROS: Review of Systems  Constitutional: Negative for weight loss.  Respiratory: Negative for shortness of breath.   Cardiovascular: Negative for chest pain.    PHYSICAL EXAM: Blood pressure 123/76, pulse 71, temperature  97.7 F (36.5 C), temperature source Oral, height 5\' 6"  (1.676 m), weight 175 lb (79.4 kg), last menstrual period 12/04/2016, SpO2 98 %. Body mass index is 28.25 kg/m. Physical Exam  Constitutional: She is oriented to person, place, and time. She appears well-developed and well-nourished.  Cardiovascular: Normal rate.   Pulmonary/Chest: Effort normal.  Neurological: She is alert and oriented to person, place, and time.  Psychiatric: She has a normal mood and affect.    RECENT LABS AND TESTS: BMET    Component Value Date/Time   NA 141 11/10/2016 0824   K 4.2 11/10/2016 0824   CL 103 11/10/2016 0824   CO2 23 11/10/2016 0824   GLUCOSE 83 11/10/2016 0824   BUN 17  11/10/2016 0824   CREATININE 0.85 11/10/2016 0824   CALCIUM 8.9 12/23/2016 0730   CALCIUM 7.5 (L) 01/03/2016 1520   GFRNONAA 82 11/10/2016 0824   GFRAA 95 11/10/2016 0824   Lab Results  Component Value Date   HGBA1C 5.6 11/10/2016   HGBA1C 5.5 08/13/2016   Lab Results  Component Value Date   INSULIN 5.6 11/10/2016   INSULIN 7.9 08/13/2016   CBC    Component Value Date/Time   WBC 7.2 08/13/2016 0928   RBC 4.45 08/13/2016 0928   HGB 12.4 08/13/2016 0928   HCT 40.8 08/13/2016 0928   MCV 92 08/13/2016 0928   MCH 27.9 08/13/2016 0928   MCHC 30.4 (L) 08/13/2016 0928   RDW 13.3 08/13/2016 0928   LYMPHSABS 2.4 08/13/2016 0928   EOSABS 0.1 08/13/2016 0928   BASOSABS 0.0 08/13/2016 0928   Iron/TIBC/Ferritin/ %Sat No results found for: IRON, TIBC, FERRITIN, IRONPCTSAT Lipid Panel     Component Value Date/Time   CHOL 193 11/10/2016 0824   TRIG 61 11/10/2016 0824   HDL 54 11/10/2016 0824   LDLCALC 127 (H) 11/10/2016 0824   Hepatic Function Panel     Component Value Date/Time   PROT 6.7 11/10/2016 0824   ALBUMIN 4.2 11/10/2016 0824   AST 15 11/10/2016 0824   ALT 12 11/10/2016 0824   ALKPHOS 48 11/10/2016 0824   BILITOT 0.2 11/10/2016 0824      Component Value Date/Time   TSH 2.080 08/13/2016 0928    ASSESSMENT AND PLAN: Essential hypertension  Class 1 obesity with serious comorbidity and body mass index (BMI) of 30.0 to 30.9 in adult, unspecified obesity type - starting BMI greater than 30  PLAN: Hypertension We discussed sodium restriction, working on healthy weight loss, and a regular exercise program as the means to achieve improved blood pressure control. Annlee agreed with this plan and agreed to follow up as directed. We will continue to monitor her blood pressure as well as her progress with the above lifestyle modifications. She will continue her medications as prescribed and will watch for signs of hypotension as she continues her lifestyle  modifications.  We spent > than 50% of the 15 minute visit on the counseling as documented in the note.  Obesity Senna is currently in the action stage of change. As such, her goal is to continue with weight loss efforts She has agreed to follow our Pescartarian [place] has been instructed to work up to a goal of 150 minutes of combined cardio and strengthening exercise at a lower intensity per week for weight loss and overall health benefits.  We discussed the following Behavioral Modification Stratagies today: increasing lean protein intake and meal planning with cooking strategies  Taneal has agreed to follow up with our clinic  in 3 weeks. She was informed of the importance of frequent follow up visits to maximize her success with intensive lifestyle modifications for her multiple health conditions.  I, April Moore , am acting as Location manager for [employer], PA-C  I have reviewed the above documentation for accuracy and completeness, and I agree with the above. -Lacy Duverney, PA-C  I have reviewed the above note and agree with the plan. -Dennard Nip, MD

## 2017-04-05 DIAGNOSIS — E559 Vitamin D deficiency, unspecified: Secondary | ICD-10-CM | POA: Diagnosis not present

## 2017-04-05 DIAGNOSIS — Z23 Encounter for immunization: Secondary | ICD-10-CM | POA: Diagnosis not present

## 2017-04-05 DIAGNOSIS — K219 Gastro-esophageal reflux disease without esophagitis: Secondary | ICD-10-CM

## 2017-04-05 DIAGNOSIS — Z1231 Encounter for screening mammogram for malignant neoplasm of breast: Secondary | ICD-10-CM | POA: Diagnosis not present

## 2017-04-05 DIAGNOSIS — J310 Chronic rhinitis: Secondary | ICD-10-CM | POA: Insufficient documentation

## 2017-04-05 DIAGNOSIS — J329 Chronic sinusitis, unspecified: Secondary | ICD-10-CM

## 2017-04-05 DIAGNOSIS — N926 Irregular menstruation, unspecified: Secondary | ICD-10-CM | POA: Diagnosis not present

## 2017-04-05 DIAGNOSIS — J4520 Mild intermittent asthma, uncomplicated: Secondary | ICD-10-CM

## 2017-04-05 DIAGNOSIS — E210 Primary hyperparathyroidism: Secondary | ICD-10-CM | POA: Diagnosis not present

## 2017-04-05 DIAGNOSIS — I10 Essential (primary) hypertension: Secondary | ICD-10-CM | POA: Diagnosis not present

## 2017-04-05 DIAGNOSIS — Z01419 Encounter for gynecological examination (general) (routine) without abnormal findings: Secondary | ICD-10-CM | POA: Diagnosis not present

## 2017-04-05 DIAGNOSIS — D251 Intramural leiomyoma of uterus: Secondary | ICD-10-CM

## 2017-04-05 DIAGNOSIS — N814 Uterovaginal prolapse, unspecified: Secondary | ICD-10-CM

## 2017-04-05 DIAGNOSIS — Z8669 Personal history of other diseases of the nervous system and sense organs: Secondary | ICD-10-CM

## 2017-04-05 DIAGNOSIS — F339 Major depressive disorder, recurrent, unspecified: Secondary | ICD-10-CM

## 2017-04-05 HISTORY — DX: Chronic rhinitis: J31.0

## 2017-04-05 HISTORY — DX: Mild intermittent asthma, uncomplicated: J45.20

## 2017-04-05 HISTORY — DX: Intramural leiomyoma of uterus: D25.1

## 2017-04-05 HISTORY — DX: Chronic sinusitis, unspecified: J32.9

## 2017-04-05 HISTORY — DX: Personal history of other diseases of the nervous system and sense organs: Z86.69

## 2017-04-05 HISTORY — DX: Gastro-esophageal reflux disease without esophagitis: K21.9

## 2017-04-05 HISTORY — DX: Major depressive disorder, recurrent, unspecified: F33.9

## 2017-04-05 HISTORY — DX: Uterovaginal prolapse, unspecified: N81.4

## 2017-04-13 ENCOUNTER — Ambulatory Visit (INDEPENDENT_AMBULATORY_CARE_PROVIDER_SITE_OTHER): Payer: 59 | Admitting: Cardiology

## 2017-04-13 VITALS — BP 112/64 | HR 76 | Resp 10

## 2017-04-13 DIAGNOSIS — R0789 Other chest pain: Secondary | ICD-10-CM

## 2017-04-13 DIAGNOSIS — E782 Mixed hyperlipidemia: Secondary | ICD-10-CM | POA: Diagnosis not present

## 2017-04-13 DIAGNOSIS — I10 Essential (primary) hypertension: Secondary | ICD-10-CM

## 2017-04-13 DIAGNOSIS — R079 Chest pain, unspecified: Secondary | ICD-10-CM

## 2017-04-13 HISTORY — DX: Other chest pain: R07.89

## 2017-04-13 NOTE — Progress Notes (Signed)
Cardiology Consultation:    Date:  04/13/2017   ID:  [name], DOB 26-Oct-1970, MRN [id number]  PCP:  Myrlene Broker, MD  Cardiologist:  Jenne Campus, MD   Referring MD: Myrlene Broker, MD   No chief complaint on file. I'm having chest pain  History of Present Illness:    [name] is a 46 y.o. female who is being seen today for the evaluation of Atypical chest pain at the request of Myrlene Broker, MD. 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 Today should approach [name] with complaint of chest pain. Pain started this morning, she described pain as mild blockage on the left side of her chest. There is no shortness of breath no sweating associated with this sensation. She greatly sensation as 4-5 scale up to 10. She described this sensation for last few weeks to months, no provoking factors. Interestingly she is being training for 5K which she did last Saturday. She has no difficulty running. It took 45 minutes to do 5K. Touching and pressing chest will give her some pain she goes to gym 2 times a week. Less than she was there about 2 weeks ago. This Also to have some dizziness and dizziness happen typically when she turns her head very quickly. There is no syncope no passing out. She also describes some dizziness when she gets up frequently. Again no syncope, no passing out. Her risk factor for coronary artery disease include mildly elevated cholesterol. She also got hypertension which is well managed. She never smoked. Does not have family history of premature coronary artery disease. At the end of her visit chest pain went away.  Past Medical History:  Diagnosis Date  . Anemia   . Asthma    as child  . Back pain   . GERD (gastroesophageal reflux disease)   . Headache   . Hypertension   . Joint pain   . Migraines   . Parathyroid abnormality ([place])   . Swelling    feet and legs  . Vitamin D deficiency     Past Surgical History:  Procedure Laterality Date    . FOOT SURGERY Bilateral 2001  . PARATHYROIDECTOMY N/A 01/03/2016   Procedure: PARATHYROIDECTOMY;  Surgeon: Leta Baptist, MD;  Location: [facility];  Service: ENT;  Laterality: N/A;  . TUBAL LIGATION      Current Medications: No outpatient prescriptions have been marked as taking for the 04/13/17 encounter (Office Visit) with Park Liter, MD.     Allergies:   Patient has no known allergies.   Social History   Social History  . Marital status: Single    Spouse name: Ronnie Huynh  . Number of children: N/A  . Years of education: N/A   Occupational History  . [employer]   Social History Main Topics  . Smoking status: Never Smoker  . Smokeless tobacco: Never Used  . Alcohol use No  . Drug use: No  . Sexual activity: Yes    Partners: Male    Birth control/ protection: Surgical     Comment: TL   Other Topics Concern  . Not on file   Social History Narrative  . No narrative on file     Family History: The patient's family history includes Cancer in her maternal grandmother and paternal grandmother; Heart disease in her paternal grandmother; Hypertension in her father. ROS:   Please see the history of present illness.  All 14 point review of systems negative except as described per history of present illness.  EKGs/Labs/Other Studies Reviewed:    The following studies were reviewed today:   EKG:  EKG is  ordered today.  The ekg ordered today demonstrates Normal sinus rhythm, normal. General normal QS complex duration and morphology, no acute ST-T segment changes.  Recent Labs: 08/13/2016: Hemoglobin 12.4; TSH 2.080 11/10/2016: ALT 12; BUN 17; Creatinine, Ser 0.85; Potassium 4.2; Sodium 141 12/23/2016: Magnesium 2.3  Recent Lipid Panel    Component Value Date/Time   CHOL 193 11/10/2016 0824   TRIG 61 11/10/2016 0824   HDL 54 11/10/2016 0824   LDLCALC 127 (H) 11/10/2016 0824    Physical Exam:    VS:  BP 112/64   Pulse 76   Resp 10     Wt  Readings from Last 3 Encounters:  03/29/17 175 lb (79.4 kg)  03/11/17 173 lb (78.5 kg)  02/24/17 171 lb (77.6 kg)     GEN:  Well nourished, well developed in no acute distress HEENT: Normal NECK: No JVD; No carotid bruits LYMPHATICS: No lymphadenopathy CARDIAC: RRR, no murmurs, no rubs, no gallops RESPIRATORY:  Clear to auscultation without rales, wheezing or rhonchi  ABDOMEN: Soft, non-tender, non-distended MUSCULOSKELETAL:  No edema; No deformity  SKIN: Warm and dry NEUROLOGIC:  Alert and oriented x 3 PSYCHIATRIC:  Normal affect   ASSESSMENT:    1. Chest pain, unspecified type   2. Essential hypertension   3. Mixed hyperlipidemia   4. Atypical chest pain    PLAN:    In order of problems listed above:  1. Atypical chest pain: Patient is lasting for hours, no EKG changes, pain is reproducible by Pressing chest wall. She just finished running 5K last Saturday with no difficulties. I doubt very much that this is a cardiac pain. I suspect that this is musculoskeletal pain. Just for completion I will ask troponin I drawn. 2. Essential hypertension: Blood pressure is well-controlled continue present management. 3. Mixed dyslipidemia: She is determined to lose some more weight and exercise more aggressively. She is also changing her diet will give her couple months before committing her to statin.   Medication Adjustments/Labs and Tests Ordered: Current medicines are reviewed at length with the patient today.  Concerns regarding medicines are outlined above.  Orders Placed This Encounter  Procedures  . Troponin I  . EKG 12-Lead   No orders of the defined types were placed in this encounter.   Signed, Park Liter, MD, [facility]. 04/13/2017 1:44 PM    [facility]

## 2017-04-13 NOTE — Patient Instructions (Signed)
Medication Instructions:  Your physician recommends that you continue on your current medications as directed. Please refer to the Current Medication list given to you today.  Labwork: Your physician recommends that you have a troponin level checked today in office.   Testing/Procedures: EKG today in office.   Follow-Up: Your physician recommends that you schedule a follow-up appointment in: 1 month   Any Other Special Instructions Will Be Listed Below (If Applicable).  Please note that any paperwork needing to be filled out by the provider will need to be addressed at the front desk prior to seeing the provider. Please note that any paperwork FMLA, Disability or other documents regarding health condition is subject to a $25.00 charge that must be received prior to completion of paperwork in the form of a money order or check.    If you need a refill on your cardiac medications before your next appointment, please call your pharmacy.

## 2017-04-14 LAB — TROPONIN I: Troponin I: <0.01 ng/mL (ref 0.00–0.04)

## 2017-04-20 ENCOUNTER — Ambulatory Visit (INDEPENDENT_AMBULATORY_CARE_PROVIDER_SITE_OTHER): Payer: 59 | Admitting: Physician Assistant

## 2017-04-20 VITALS — BP 110/74 | HR 79 | Temp 98.0°F | Ht 66.0 in | Wt 173.0 lb

## 2017-04-20 DIAGNOSIS — Z6830 Body mass index (BMI) 30.0-30.9, adult: Secondary | ICD-10-CM | POA: Diagnosis not present

## 2017-04-20 DIAGNOSIS — Z9189 Other specified personal risk factors, not elsewhere classified: Secondary | ICD-10-CM

## 2017-04-20 DIAGNOSIS — R079 Chest pain, unspecified: Secondary | ICD-10-CM | POA: Diagnosis not present

## 2017-04-20 DIAGNOSIS — F3289 Other specified depressive episodes: Secondary | ICD-10-CM

## 2017-04-20 DIAGNOSIS — E669 Obesity, unspecified: Secondary | ICD-10-CM | POA: Diagnosis not present

## 2017-04-20 DIAGNOSIS — E559 Vitamin D deficiency, unspecified: Secondary | ICD-10-CM

## 2017-04-20 MED ORDER — BUPROPION HCL ER (SR) 150 MG PO TB12: 150.0000 mg | ORAL_TABLET | Freq: Every morning | ORAL | 0 refills | Status: DC

## 2017-04-20 MED ORDER — VITAMIN D (ERGOCALCIFEROL) 1.25 MG (50000 UNIT) PO CAPS: 50000.0000 [IU] | ORAL_CAPSULE | ORAL | 0 refills | Status: DC

## 2017-04-21 MED FILL — VIT D2 1.25 MG (50,000 UNIT: 1.25 MG | 28 days supply | Qty: 4 | Fill #0

## 2017-04-21 MED FILL — BUPROPION SR 150 MG TABLET: 150 | 30 days supply | Qty: 30 | Fill #0

## 2017-04-21 NOTE — Progress Notes (Signed)
Office: [phone]  /  Fax: [phone]   HPI:   Chief Complaint: OBESITY [name] is here to discuss her progress with her obesity treatment plan. She is on the Pescatarian eating plan and is following her eating plan approximately 90 to 95 % of the time. She states she is exercising 0 minutes 0 times per week. [name] continues to do well with weight loss. She is adding variety to her meals and she keeps a food journal. [name] states hunger is well controlled. Despite having a busy work schedule, she manages to plan ahead her meals and to work towards weight loss. Her weight is 173 lb (78.5 kg) today and has had a weight loss of 2 pounds over a period of 3 weeks since her last visit. She has lost 15 lbs since starting treatment with [name].  Chest pain, Unspecified [name] complains of chest pain that she felt on two different occasions last week. States she followed up with Cardiology in which EKG and Troponin level were normal and that Cardiologist thought chest pain was noncardic related.  She states chest pain is reproducible with palpation of chest wall. Denies any current or recurrence of the chest pain. States chest pain is described as heaviness on the chest, that lasted for few minutes, denies any dyspnea, sweating,  nausea, vomiting, or any recent URI. She also denies radiation of the pain, and no alleviating/aggravating factors.   Vitamin D deficiency [name] has a diagnosis of vitamin D deficiency. She is currently taking vit D and denies nausea, vomiting or muscle weakness.  At risk for osteopenia and osteoporosis [name] is at higher risk of osteopenia and osteoporosis due to vitamin D deficiency.   Depression with emotional eating behaviors [name] is struggling with emotional eating and using food for comfort to the extent that it is negatively impacting her health. She often snacks when she is not hungry. [name] sometimes feels she is out of control and then feels guilty that she made poor food  choices. She has been working on behavior modification techniques to help reduce her emotional eating and has been somewhat successful. Her mood is stable and she shows no sign of suicidal or homicidal ideations.  Depression screen PHQ 2/9 08/13/2016  Decreased Interest 1  Down, Depressed, Hopeless 2  PHQ - 2 Score 3  Altered sleeping 1  Tired, decreased energy 3  Change in appetite 3  Feeling bad or failure about yourself  1  Trouble concentrating 1  Moving slowly or fidgety/restless 1  Suicidal thoughts 0  PHQ-9 Score 13      ALLERGIES: No Known Allergies  MEDICATIONS: Current Outpatient Prescriptions on File Prior to Visit  Medication Sig Dispense Refill  . ibuprofen (ADVIL,MOTRIN) 400 MG tablet Take 1 tablet (400 mg total) by mouth 3 (three) times daily. 30 tablet 0  . magnesium oxide (MAG-OX) 400 MG tablet Take 400 mg by mouth daily.    . SUMAtriptan (IMITREX) 50 MG tablet Take 50 mg by mouth every 2 (two) hours as needed for migraine. May repeat in 2 hours if headache persists or recurs.    . valsartan (DIOVAN) 160 MG tablet Take 40 mg by mouth daily.      No current facility-administered medications on file prior to visit.     PAST MEDICAL HISTORY: Past Medical History:  Diagnosis Date  . Anemia   . Asthma    as child  . Back pain   . GERD (gastroesophageal reflux disease)   . Headache   .  Hypertension   . Joint pain   . Migraines   . Parathyroid abnormality ([place])   . Swelling    feet and legs  . Vitamin D deficiency     PAST SURGICAL HISTORY: Past Surgical History:  Procedure Laterality Date  . FOOT SURGERY Bilateral 2001  . PARATHYROIDECTOMY N/A 01/03/2016   Procedure: PARATHYROIDECTOMY;  Surgeon: Leta Baptist, MD;  Location: [place];  Service: ENT;  Laterality: N/A;  . TUBAL LIGATION      SOCIAL HISTORY: Social History  Substance Use Topics  . Smoking status: Never Smoker  . Smokeless tobacco: Never Used  . Alcohol use No     FAMILY HISTORY: Family History  Problem Relation Age of Onset  . Hypertension Father   . Cancer Maternal Grandmother   . Heart disease Paternal Grandmother   . Cancer Paternal Grandmother     ROS: Review of Systems  Constitutional: Positive for weight loss.  Gastrointestinal: Negative for nausea and vomiting.  Musculoskeletal:       Negative muscle weakness  Psychiatric/Behavioral: Positive for depression. Negative for suicidal ideas.    PHYSICAL EXAM: Blood pressure 110/74, pulse 79, temperature 98 F (36.7 C), temperature source Oral, height 5\' 6"  (1.676 m), weight 173 lb (78.5 kg), SpO2 96 %. Body mass index is 27.92 kg/m. Physical Exam  Constitutional: She is oriented to person, place, and time. She appears well-developed and well-nourished.  Cardiovascular: Normal rate.   Pulmonary/Chest: Effort normal.  Musculoskeletal: Normal range of motion.  Neurological: She is oriented to person, place, and time.  Skin: Skin is warm and dry.  Psychiatric: She has a normal mood and affect. Her behavior is normal.  Vitals reviewed.   RECENT LABS AND TESTS: BMET    Component Value Date/Time   NA 141 11/10/2016 0824   K 4.2 11/10/2016 0824   CL 103 11/10/2016 0824   CO2 23 11/10/2016 0824   GLUCOSE 83 11/10/2016 0824   BUN 17 11/10/2016 0824   CREATININE 0.85 11/10/2016 0824   CALCIUM 8.9 12/23/2016 0730   CALCIUM 7.5 (L) 01/03/2016 1520   GFRNONAA 82 11/10/2016 0824   GFRAA 95 11/10/2016 0824   Lab Results  Component Value Date   HGBA1C 5.6 11/10/2016   HGBA1C 5.5 08/13/2016   Lab Results  Component Value Date   INSULIN 5.6 11/10/2016   INSULIN 7.9 08/13/2016   CBC    Component Value Date/Time   WBC 7.2 08/13/2016 0928   RBC 4.45 08/13/2016 0928   HGB 12.4 08/13/2016 0928   HCT 40.8 08/13/2016 0928   MCV 92 08/13/2016 0928   MCH 27.9 08/13/2016 0928   MCHC 30.4 (L) 08/13/2016 0928   RDW 13.3 08/13/2016 0928   LYMPHSABS 2.4 08/13/2016 0928   EOSABS  0.1 08/13/2016 0928   BASOSABS 0.0 08/13/2016 0928   Iron/TIBC/Ferritin/ %Sat No results found for: IRON, TIBC, FERRITIN, IRONPCTSAT Lipid Panel     Component Value Date/Time   CHOL 193 11/10/2016 0824   TRIG 61 11/10/2016 0824   HDL 54 11/10/2016 0824   LDLCALC 127 (H) 11/10/2016 0824   Hepatic Function Panel     Component Value Date/Time   PROT 6.7 11/10/2016 0824   ALBUMIN 4.2 11/10/2016 0824   AST 15 11/10/2016 0824   ALT 12 11/10/2016 0824   ALKPHOS 48 11/10/2016 0824   BILITOT 0.2 11/10/2016 0824      Component Value Date/Time   TSH 2.080 08/13/2016 0928    ASSESSMENT AND PLAN: Vitamin D  deficiency - Plan: Vitamin D, Ergocalciferol, (DRISDOL) 50000 units CAPS capsule  Other depression - with emotional eating - Plan: buPROPion (WELLBUTRIN SR) 150 MG 12 hr tablet  At risk for osteoporosis  Chest pain, unspecified type  Class 1 obesity with serious comorbidity and body mass index (BMI) of 30.0 to 30.9 in adult, unspecified obesity type - starting BMI greater then 30  PLAN:  Chest pain, unspecified [name] is  She is informed that since chest pain did not recur, and since it is reproducible with chest wall palpation, and recent testing with EKG and troponin  were normal, chest pain is likely non-cardiac in etiology. However, she is advised to follow up with Cardiologist as planned for any further testing such as stress testing or echocardiogram for further evaluation. Also, she is instructed that if chest pain returns of if she develops any other symptoms, to go to the ER. [name] agrees to the plan   Vitamin D Deficiency [name] was informed that low vitamin D levels contributes to fatigue and are associated with obesity, breast, and colon cancer. She agrees to continue to take prescription Vit D @50 ,000 IU every week #4 with no refills and will follow up for routine testing of vitamin D, at least 2-3 times per year. She was informed of the risk of over-replacement of vitamin D  and agrees to not increase her dose unless he discusses this with [name] first.  At risk for osteopenia and osteoporosis [name] is at risk for osteopenia and osteoporosis due to her vitamin D deficiency. She was encouraged to take her vitamin D and follow her higher calcium diet and increase strengthening exercise to help strengthen her bones and decrease her risk of osteopenia and osteoporosis.  Depression with Emotional Eating Behaviors We discussed behavior modification techniques today to help [name] deal with her emotional eating and depression. She has agreed to take Wellbutrin SR 150 mg qd #30 with no refills and will follow up as directed.  Obesity [name] is currently in the action stage of change. As such, her goal is to continue with weight loss efforts She has agreed to keep a food journal with 1300 calories and 90 grams of protein daily [name] has been instructed to work up to a goal of 150 minutes of combined cardio and strengthening exercise per week for weight loss and overall health benefits. We discussed the following Behavioral Modification Strategies today: increasing lean protein intake and work on meal planning and easy cooking plans  [name] has agreed to follow up with our clinic in 3 weeks. She was informed of the importance of frequent follow up visits to maximize her success with intensive lifestyle modifications for her multiple health conditions.  I, [name], am acting as transcriptionist for Lacy Duverney, PA-C  I have reviewed the above documentation for accuracy and completeness, and I agree with the above. -Lacy Duverney, PA-C  I have reviewed the above note and agree with the plan. -Dennard Nip, MD  OBESITY BEHAVIORAL INTERVENTION VISIT  Today's visit was # 16 out of 79.  Starting weight: 188 lbs Starting date: 08/13/16 Today's weight : 188 lbs  Today's date: 04/20/2017 Total lbs lost to date: 15 (Patients must lose 7 lbs in the first 6 months to continue with  counseling)   ASK: We discussed the diagnosis of obesity with [name] today and [name] agreed to give [name] permission to discuss obesity behavioral modification therapy today.  ASSESS: [name] has the diagnosis of obesity and her  BMI today is 27.94 [name] is in the action stage of change   ADVISE: [name] was educated on the multiple health risks of obesity as well as the benefit of weight loss to improve her health. She was advised of the need for long term treatment and the importance of lifestyle modifications.  AGREE: Multiple dietary modification options and treatment options were discussed and  [name] agreed to keep a food journal with 1300 calories and 90 grams of protein daily We discussed the following Behavioral Modification Strategies today: increasing lean protein intake and work on meal planning and easy cooking plans

## 2017-04-23 ENCOUNTER — Ambulatory Visit: Payer: 59 | Admitting: Cardiology

## 2017-05-11 ENCOUNTER — Ambulatory Visit (INDEPENDENT_AMBULATORY_CARE_PROVIDER_SITE_OTHER): Payer: 59 | Admitting: Physician Assistant

## 2017-05-11 VITALS — BP 102/70 | HR 67 | Temp 98.3°F | Ht 66.0 in | Wt 172.0 lb

## 2017-05-11 DIAGNOSIS — E669 Obesity, unspecified: Secondary | ICD-10-CM | POA: Diagnosis not present

## 2017-05-11 DIAGNOSIS — E559 Vitamin D deficiency, unspecified: Secondary | ICD-10-CM

## 2017-05-11 DIAGNOSIS — Z6830 Body mass index (BMI) 30.0-30.9, adult: Secondary | ICD-10-CM

## 2017-05-11 NOTE — Progress Notes (Signed)
Office: [phone]  /  Fax: [phone]   HPI:   Chief Complaint: OBESITY [name] is here to discuss her progress with her obesity treatment plan. She is on the keep a food journal with 1300 calories and 90 grams of protein daily and is following her eating plan approximately 95 % of the time. She states she is exercising 0 minutes 0 times per week. [name] continues to do well with weight loss. She plans her meals well and incorporates variety at her meals. She would like more meal planning ideas. Her weight is 172 lb (78 kg) today and has had a weight loss of 1 pound over a period of 3 weeks since her last visit. She has lost 16 lbs since starting treatment with [name].  Vitamin D deficiency [name] has a diagnosis of vitamin D deficiency. She is currently taking vit D and denies nausea, vomiting or muscle weakness.   ALLERGIES: No Known Allergies  MEDICATIONS: Current Outpatient Medications on File Prior to Visit  Medication Sig Dispense Refill  . buPROPion (WELLBUTRIN SR) 150 MG 12 hr tablet Take 1 tablet (150 mg total) by mouth every morning. 30 tablet 0  . ibuprofen (ADVIL,MOTRIN) 400 MG tablet Take 1 tablet (400 mg total) by mouth 3 (three) times daily. 30 tablet 0  . magnesium oxide (MAG-OX) 400 MG tablet Take 400 mg by mouth daily.    . SUMAtriptan (IMITREX) 50 MG tablet Take 50 mg by mouth every 2 (two) hours as needed for migraine. May repeat in 2 hours if headache persists or recurs.    . valsartan (DIOVAN) 160 MG tablet Take 40 mg by mouth daily.     . Vitamin D, Ergocalciferol, (DRISDOL) 50000 units CAPS capsule Take 1 capsule (50,000 Units total) by mouth every 7 (seven) days. 4 capsule 0   No current facility-administered medications on file prior to visit.     PAST MEDICAL HISTORY: Past Medical History:  Diagnosis Date  . Anemia   . Asthma    as child  . Back pain   . GERD (gastroesophageal reflux disease)   . Headache   . Hypertension   . Joint pain   . Migraines    . Parathyroid abnormality ([place])   . Swelling    feet and legs  . Vitamin D deficiency     PAST SURGICAL HISTORY: Past Surgical History:  Procedure Laterality Date  . FOOT SURGERY Bilateral 2001  . TUBAL LIGATION      SOCIAL HISTORY: Social History   Tobacco Use  . Smoking status: Never Smoker  . Smokeless tobacco: Never Used  Substance Use Topics  . Alcohol use: No  . Drug use: No    FAMILY HISTORY: Family History  Problem Relation Age of Onset  . Hypertension Father   . Cancer Maternal Grandmother   . Heart disease Paternal Grandmother   . Cancer Paternal Grandmother     ROS: Review of Systems  Constitutional: Positive for weight loss.  Gastrointestinal: Negative for nausea and vomiting.  Musculoskeletal:       Negative muscle weakness    PHYSICAL EXAM: Blood pressure 102/70, pulse 67, temperature 98.3 F (36.8 C), temperature source Oral, height 5\' 6"  (1.676 m), weight 172 lb (78 kg), last menstrual period 03/29/2017, SpO2 97 %. Body mass index is 27.76 kg/m. Physical Exam  Constitutional: She is oriented to person, place, and time. She appears well-developed and well-nourished.  Cardiovascular: Normal rate.  Pulmonary/Chest: Effort normal.  Musculoskeletal: Normal range of motion.  Neurological: She is oriented to person, place, and time.  Skin: Skin is warm and dry.  Psychiatric: She has a normal mood and affect. Her behavior is normal.  Vitals reviewed.   RECENT LABS AND TESTS: BMET    Component Value Date/Time   NA 141 11/10/2016 0824   K 4.2 11/10/2016 0824   CL 103 11/10/2016 0824   CO2 23 11/10/2016 0824   GLUCOSE 83 11/10/2016 0824   BUN 17 11/10/2016 0824   CREATININE 0.85 11/10/2016 0824   CALCIUM 8.9 12/23/2016 0730   CALCIUM 7.5 (L) 01/03/2016 1520   GFRNONAA 82 11/10/2016 0824   GFRAA 95 11/10/2016 0824   Lab Results  Component Value Date   HGBA1C 5.6 11/10/2016   HGBA1C 5.5 08/13/2016   Lab Results  Component Value Date     INSULIN 5.6 11/10/2016   INSULIN 7.9 08/13/2016   CBC    Component Value Date/Time   WBC 7.2 08/13/2016 0928   RBC 4.45 08/13/2016 0928   HGB 12.4 08/13/2016 0928   HCT 40.8 08/13/2016 0928   MCV 92 08/13/2016 0928   MCH 27.9 08/13/2016 0928   MCHC 30.4 (L) 08/13/2016 0928   RDW 13.3 08/13/2016 0928   LYMPHSABS 2.4 08/13/2016 0928   EOSABS 0.1 08/13/2016 0928   BASOSABS 0.0 08/13/2016 0928   Iron/TIBC/Ferritin/ %Sat No results found for: IRON, TIBC, FERRITIN, IRONPCTSAT Lipid Panel     Component Value Date/Time   CHOL 193 11/10/2016 0824   TRIG 61 11/10/2016 0824   HDL 54 11/10/2016 0824   LDLCALC 127 (H) 11/10/2016 0824   Hepatic Function Panel     Component Value Date/Time   PROT 6.7 11/10/2016 0824   ALBUMIN 4.2 11/10/2016 0824   AST 15 11/10/2016 0824   ALT 12 11/10/2016 0824   ALKPHOS 48 11/10/2016 0824   BILITOT 0.2 11/10/2016 0824      Component Value Date/Time   TSH 2.080 08/13/2016 0928    ASSESSMENT AND PLAN: Vitamin D deficiency  Class 1 obesity with serious comorbidity and body mass index (BMI) of 30.0 to 30.9 in adult, unspecified obesity type - Starting BMI greater then 30  PLAN:  Vitamin D Deficiency [name] was informed that low vitamin D levels contributes to fatigue and are associated with obesity, breast, and colon cancer. She agrees to continue to take prescription Vit D @50 ,000 IU every week and will follow up for routine testing of vitamin D, at least 2-3 times per year. She was informed of the risk of over-replacement of vitamin D and agrees to not increase her dose unless he discusses this with [name] first.  We spent > than 50% of the 15 minute visit on the counseling as documented in the note.  Obesity [name] is currently in the action stage of change. As such, her goal is to continue with weight loss efforts She has agreed to keep a food journal with 1200 calories and 85 grams of protein daily [name] has been instructed to work up to a  goal of 150 minutes of combined cardio and strengthening exercise per week for weight loss and overall health benefits. We discussed the following Behavioral Modification Strategies today: increasing lean protein intake  And keeping healthy foods in the home  [name] has agreed to follow up with our clinic in 4 weeks. She was informed of the importance of frequent follow up visits to maximize her success with intensive lifestyle modifications for her multiple health conditions.  Corey Skains, am acting  as transcriptionist for Lacy Duverney, PA-C  I have reviewed the above documentation for accuracy and completeness, and I agree with the above. -Lacy Duverney, PA-C  I have reviewed the above note and agree with the plan. -Dennard Nip, MD   OBESITY BEHAVIORAL INTERVENTION VISIT  Today's visit was # 17 out of 5.  Starting weight: 188 lbs Starting date: 08/13/16 Today's weight : 172 lbs Today's date: 05/11/2017 Total lbs lost to date: 16 (Patients must lose 7 lbs in the first 6 months to continue with counseling)   ASK: We discussed the diagnosis of obesity with [name] today and [name] agreed to give [name] permission to discuss obesity behavioral modification therapy today.  ASSESS: [name] has the diagnosis of obesity and her BMI today is 27.77 [name] is in the action stage of change   ADVISE: [name] was educated on the multiple health risks of obesity as well as the benefit of weight loss to improve her health. She was advised of the need for long term treatment and the importance of lifestyle modifications.  AGREE: Multiple dietary modification options and treatment options were discussed and  [name] agreed to keep a food journal with 1200 calories and 85 grams of protein daily We discussed the following Behavioral Modification Strategies today: increasing lean protein intake and keeping healthy foods in the home

## 2017-05-26 DIAGNOSIS — R7989 Other specified abnormal findings of blood chemistry: Secondary | ICD-10-CM

## 2017-05-26 DIAGNOSIS — G43909 Migraine, unspecified, not intractable, without status migrainosus: Secondary | ICD-10-CM | POA: Insufficient documentation

## 2017-05-26 HISTORY — DX: Other specified abnormal findings of blood chemistry: R79.89

## 2017-05-26 HISTORY — DX: Migraine, unspecified, not intractable, without status migrainosus: G43.909

## 2017-06-01 MED FILL — SUMATRIPTAN SUCC 50 MG TABL: 50 | 90 days supply | Qty: 27 | Fill #0 | Status: TO

## 2017-06-08 ENCOUNTER — Ambulatory Visit (INDEPENDENT_AMBULATORY_CARE_PROVIDER_SITE_OTHER): Payer: 59 | Admitting: Physician Assistant

## 2017-06-08 VITALS — BP 128/87 | HR 64 | Temp 98.0°F | Ht 66.0 in | Wt 174.0 lb

## 2017-06-08 DIAGNOSIS — E559 Vitamin D deficiency, unspecified: Secondary | ICD-10-CM | POA: Diagnosis not present

## 2017-06-08 DIAGNOSIS — Z9189 Other specified personal risk factors, not elsewhere classified: Secondary | ICD-10-CM | POA: Diagnosis not present

## 2017-06-08 DIAGNOSIS — Z6830 Body mass index (BMI) 30.0-30.9, adult: Secondary | ICD-10-CM

## 2017-06-08 DIAGNOSIS — I10 Essential (primary) hypertension: Secondary | ICD-10-CM

## 2017-06-08 DIAGNOSIS — E669 Obesity, unspecified: Secondary | ICD-10-CM | POA: Diagnosis not present

## 2017-06-09 MED ORDER — VITAMIN D (ERGOCALCIFEROL) 1.25 MG (50000 UNIT) PO CAPS: 50000.0000 [IU] | ORAL_CAPSULE | ORAL | 0 refills | Status: DC

## 2017-06-09 MED FILL — VIT D2 1.25 MG (50,000 UNIT: 1.25 MG | 28 days supply | Qty: 4 | Fill #0

## 2017-06-09 NOTE — Progress Notes (Signed)
Office: [phone]  /  Fax: [phone]   HPI:   Chief Complaint: OBESITY [name] is here to discuss her progress with her obesity treatment plan. She is on the keep a food journal with 1200 calories and 85 grams of protein daily and is following her eating plan approximately 95 % of the time. She states she is exercising 0 minutes 0 times per week. [name] is mindful of her eating and controls her portions. She states her hunger is well controlled.  Her weight is 174 lb (78.9 kg) today and has gained 2 pounds since her last visit. She has lost 14 lbs since starting treatment with [name].  Vitamin D deficiency [name] has a diagnosis of vitamin D deficiency. She is currently taking prescription Vit D and denies nausea, vomiting or muscle weakness.  Hypertension [name] is a 46 y.o. female with hypertension. [name]'s blood pressure is stable and she denies chest pain or shortness of breath. She is working weight loss to help control her blood pressure with the goal of decreasing her risk of heart attack and stroke. [name]'s blood pressure is not currently controlled.  At risk for cardiovascular disease [name] is at a higher than average risk for cardiovascular disease due to obesity and hypertension. She currently denies any chest pain.  ALLERGIES: No Known Allergies  MEDICATIONS: Current Outpatient Medications on File Prior to Visit  Medication Sig Dispense Refill  . buPROPion (WELLBUTRIN SR) 150 MG 12 hr tablet Take 1 tablet (150 mg total) by mouth every morning. 30 tablet 0  . ibuprofen (ADVIL,MOTRIN) 400 MG tablet Take 1 tablet (400 mg total) by mouth 3 (three) times daily. 30 tablet 0  . magnesium oxide (MAG-OX) 400 MG tablet Take 400 mg by mouth daily.    . SUMAtriptan (IMITREX) 50 MG tablet Take 50 mg by mouth every 2 (two) hours as needed for migraine. May repeat in 2 hours if headache persists or recurs.    . valsartan (DIOVAN) 160 MG tablet Take 40 mg by mouth daily.     . Vitamin D,  Ergocalciferol, (DRISDOL) 50000 units CAPS capsule Take 1 capsule (50,000 Units total) by mouth every 7 (seven) days. 4 capsule 0   No current facility-administered medications on file prior to visit.     PAST MEDICAL HISTORY: Past Medical History:  Diagnosis Date  . Anemia   . Asthma    as child  . Back pain   . GERD (gastroesophageal reflux disease)   . Headache   . Hypertension   . Joint pain   . Migraines   . Parathyroid abnormality ([name])   . Swelling    feet and legs  . Vitamin D deficiency     PAST SURGICAL HISTORY: Past Surgical History:  Procedure Laterality Date  . FOOT SURGERY Bilateral 2001  . PARATHYROIDECTOMY N/A 01/03/2016   Procedure: PARATHYROIDECTOMY;  Surgeon: Leta Baptist, MD;  Location: [facility];  Service: ENT;  Laterality: N/A;  . TUBAL LIGATION      SOCIAL HISTORY: Social History   Tobacco Use  . Smoking status: Never Smoker  . Smokeless tobacco: Never Used  Substance Use Topics  . Alcohol use: No  . Drug use: No    FAMILY HISTORY: Family History  Problem Relation Age of Onset  . Hypertension Father   . Cancer Maternal Grandmother   . Heart disease Paternal Grandmother   . Cancer Paternal Grandmother     ROS: Review of Systems  Constitutional: Negative for  weight loss.  Respiratory: Negative for shortness of breath.   Cardiovascular: Negative for chest pain.  Gastrointestinal: Negative for nausea and vomiting.  Musculoskeletal:       Negative muscle weakness    PHYSICAL EXAM: Blood pressure 128/87, pulse 64, temperature 98 F (36.7 C), temperature source Oral, height 5\' 6"  (1.676 m), weight 174 lb (78.9 kg), last menstrual period 05/11/2017, SpO2 98 %. Body mass index is 28.08 kg/m. Physical Exam  Constitutional: She is oriented to person, place, and time. She appears well-developed and well-nourished.  Cardiovascular: Normal rate.  Pulmonary/Chest: Effort normal.  Musculoskeletal: Normal range of motion.    Neurological: She is oriented to person, place, and time.  Skin: Skin is warm and dry.  Psychiatric: She has a normal mood and affect. Her behavior is normal.  Vitals reviewed.   RECENT LABS AND TESTS: BMET    Component Value Date/Time   NA 141 11/10/2016 0824   K 4.2 11/10/2016 0824   CL 103 11/10/2016 0824   CO2 23 11/10/2016 0824   GLUCOSE 83 11/10/2016 0824   BUN 17 11/10/2016 0824   CREATININE 0.85 11/10/2016 0824   CALCIUM 8.9 12/23/2016 0730   CALCIUM 7.5 (L) 01/03/2016 1520   GFRNONAA 82 11/10/2016 0824   GFRAA 95 11/10/2016 0824   Lab Results  Component Value Date   HGBA1C 5.6 11/10/2016   HGBA1C 5.5 08/13/2016   Lab Results  Component Value Date   INSULIN 5.6 11/10/2016   INSULIN 7.9 08/13/2016   CBC    Component Value Date/Time   WBC 7.2 08/13/2016 0928   RBC 4.45 08/13/2016 0928   HGB 12.4 08/13/2016 0928   HCT 40.8 08/13/2016 0928   MCV 92 08/13/2016 0928   MCH 27.9 08/13/2016 0928   MCHC 30.4 (L) 08/13/2016 0928   RDW 13.3 08/13/2016 0928   LYMPHSABS 2.4 08/13/2016 0928   EOSABS 0.1 08/13/2016 0928   BASOSABS 0.0 08/13/2016 0928   Iron/TIBC/Ferritin/ %Sat No results found for: IRON, TIBC, FERRITIN, IRONPCTSAT Lipid Panel     Component Value Date/Time   CHOL 193 11/10/2016 0824   TRIG 61 11/10/2016 0824   HDL 54 11/10/2016 0824   LDLCALC 127 (H) 11/10/2016 0824   Hepatic Function Panel     Component Value Date/Time   PROT 6.7 11/10/2016 0824   ALBUMIN 4.2 11/10/2016 0824   AST 15 11/10/2016 0824   ALT 12 11/10/2016 0824   ALKPHOS 48 11/10/2016 0824   BILITOT 0.2 11/10/2016 0824      Component Value Date/Time   TSH 2.080 08/13/2016 0928    ASSESSMENT AND PLAN: Vitamin D deficiency - Plan: Vitamin D, Ergocalciferol, (DRISDOL) 50000 units CAPS capsule  Essential hypertension  At risk for heart disease  Class 1 obesity with serious comorbidity and body mass index (BMI) of 30.0 to 30.9 in adult, unspecified obesity type -  Starting BMI greater then 30  PLAN:  Vitamin D Deficiency [name] was informed that low vitamin D levels contributes to fatigue and are associated with obesity, breast, and colon cancer. [name] agrees to continue taking prescription Vit D @50 ,000 IU every week #4 and we will refill for 1 month. She will follow up for routine testing of vitamin D, at least 2-3 times per year. She was informed of the risk of over-replacement of vitamin D and agrees to not increase her dose unless he discusses this with [name] first. [name] agrees to follow up with our clinic in 4 weeks.  Hypertension We discussed sodium  restriction, working on healthy weight loss, and a regular exercise program as the means to achieve improved blood pressure control. [name] agreed with this plan and agreed to follow up as directed. We will continue to monitor her blood pressure as well as her progress with the above lifestyle modifications. She will continue her medications as prescribed and will watch for signs of hypotension as she continues her lifestyle modifications. [name] agrees to follow up with our clinic in 4 weeks.  Cardiovascular risk counselling [name] was given extended (15 minutes) coronary artery disease prevention counseling today. She is 46 y.o. female and has risk factors for heart disease including obesity and hypertension. We discussed intensive lifestyle modifications today with an emphasis on specific weight loss instructions and strategies. Pt was also informed of the importance of increasing exercise and decreasing saturated fats to help prevent heart disease.  Obesity [name] is currently in the action stage of change. As such, her goal is to continue with weight loss efforts She has agreed to keep a food journal with 1200 calories and 85 grams of protein daily [name] has been instructed to work up to a goal of 150 minutes of combined cardio and strengthening exercise per week for weight loss and overall health benefits. We  discussed the following Behavioral Modification Strategies today: increasing lean protein intake and work on meal planning and easy cooking plans   [name] has agreed to follow up with our clinic in 4 weeks. She was informed of the importance of frequent follow up visits to maximize her success with intensive lifestyle modifications for her multiple health conditions.  I, [name], am acting as transcriptionist for Anna Duverney, PA-C  I have reviewed the above documentation for accuracy and completeness, and I agree with the above. -Anna Duverney, PA-C  I have reviewed the above note and agree with the plan. -Anna Nip, MD     Today's visit was # 18 out of 22.  Starting weight: 188 lbs Starting date: 08/13/16 Today's weight : 174 lbs  Today's date: 06/08/2017 Total lbs lost to date: 14 (Patients must lose 7 lbs in the first 6 months to continue with counseling)   ASK: We discussed the diagnosis of obesity with [name] today and [name] agreed to give [name] permission to discuss obesity behavioral modification therapy today.  ASSESS: [name] has the diagnosis of obesity and her BMI today is 28.1 [name] is in the action stage of change   ADVISE: [name] was educated on the multiple health risks of obesity as well as the benefit of weight loss to improve her health. She was advised of the need for long term treatment and the importance of lifestyle modifications.  AGREE: Multiple dietary modification options and treatment options were discussed and  Mailynn agreed to keep a food journal with 1200 calories and 85 grams of protein daily We discussed the following Behavioral Modification Strategies today: increasing lean protein intake and work on meal planning and easy cooking plans

## 2017-06-16 ENCOUNTER — Other Ambulatory Visit: Payer: Self-pay | Admitting: Obstetrics and Gynecology

## 2017-06-16 DIAGNOSIS — Z1231 Encounter for screening mammogram for malignant neoplasm of breast: Secondary | ICD-10-CM

## 2017-07-02 ENCOUNTER — Ambulatory Visit (INDEPENDENT_AMBULATORY_CARE_PROVIDER_SITE_OTHER): Payer: 59

## 2017-07-02 ENCOUNTER — Ambulatory Visit (INDEPENDENT_AMBULATORY_CARE_PROVIDER_SITE_OTHER): Payer: 59 | Admitting: Podiatry

## 2017-07-02 ENCOUNTER — Other Ambulatory Visit: Payer: Self-pay | Admitting: Podiatry

## 2017-07-02 DIAGNOSIS — M7662 Achilles tendinitis, left leg: Principal | ICD-10-CM

## 2017-07-02 DIAGNOSIS — M722 Plantar fascial fibromatosis: Secondary | ICD-10-CM | POA: Diagnosis not present

## 2017-07-02 DIAGNOSIS — M7661 Achilles tendinitis, right leg: Secondary | ICD-10-CM

## 2017-07-02 NOTE — Progress Notes (Addendum)
Subjective: 46 year old female presents the office today for concerns of Achilles pain on her right side which is been ongoing for the last several weeks.  When she is working she feels a sharp pain along the Achilles tendon.  She denies any recent injury or trauma.  She denies any swelling or redness.  She has no numbness or tingling.  She had no recent treatment for this.  She is requesting new orthotics.  She has no other concerns per Denies any systemic complaints such as fevers, chills, nausea, vomiting. No acute changes since last appointment, and no other complaints at this time.   Objective: AAO x3, NAD DP/PT pulses palpable bilaterally, CRT less than 3 seconds There is mild tenderness palpation of the distal portion of the Achilles tendon on the right side.  Thompson test is negative and Achilles tendon does appear to be intact.  There is no significant edema, erythema, increase in warmth.  She has a history of prior bunion surgeries but there is mild recurrence.  The scars are well formed.  There is no significant pain on the plantar fascia although she was having pain in this area previously.  There is no area pinpoint bony tenderness or pain to vibratory sensation. No open lesions or pre-ulcerative lesions.  No pain with calf compression, swelling, warmth, erythema  Assessment: Achilles tendonitis, history of plantar fasciitis   Plan: -All treatment options discussed with the patient including all alternatives, risks, complications.  -X-rays were obtained and reviewed today bilateral feet.  There is inferior calcaneal spurring present as well as spurring at the dorsal first metatarsal phalangeal joint.  Hardware intact from the prior bunion surgeries.  There is no evidence of acute fracture identified today. -Requested a plantar fascial brace today which was dispensed.  -She is requesting new orthotics today.  She was molded for the orthotics and they were sent to [facility].  -Discussed  stretching, icing exercises daily.  We also discussed physical therapy. -Patient encouraged to call the office with any questions, concerns, change in symptoms.   Trula Slade DPM

## 2017-07-07 ENCOUNTER — Ambulatory Visit (INDEPENDENT_AMBULATORY_CARE_PROVIDER_SITE_OTHER): Payer: 59 | Admitting: Physician Assistant

## 2017-07-07 VITALS — BP 117/82 | HR 74 | Temp 98.3°F | Ht 66.0 in | Wt 177.0 lb

## 2017-07-07 DIAGNOSIS — Z9189 Other specified personal risk factors, not elsewhere classified: Secondary | ICD-10-CM | POA: Diagnosis not present

## 2017-07-07 DIAGNOSIS — F3289 Other specified depressive episodes: Secondary | ICD-10-CM

## 2017-07-07 DIAGNOSIS — Z6830 Body mass index (BMI) 30.0-30.9, adult: Secondary | ICD-10-CM

## 2017-07-07 DIAGNOSIS — E669 Obesity, unspecified: Secondary | ICD-10-CM | POA: Diagnosis not present

## 2017-07-07 DIAGNOSIS — I10 Essential (primary) hypertension: Secondary | ICD-10-CM

## 2017-07-07 MED ORDER — BUPROPION HCL ER (SR) 150 MG PO TB12
150.0000 mg | ORAL_TABLET | Freq: Every morning | ORAL | 0 refills | Status: DC
Start: 2017-07-07 — End: 2017-10-06

## 2017-07-08 MED FILL — BUPROPION SR 150 MG TABLET: 150 | 30 days supply | Qty: 30 | Fill #0

## 2017-07-08 NOTE — Progress Notes (Signed)
.   Office: [phone]  /  Fax: [phone]   HPI:   Chief Complaint: OBESITY [name] is here to discuss her progress with her obesity treatment plan. She is on the keep a food journal with 1200 calories and 85 grams of protein daily and is following her eating plan approximately 75 % of the time. She states she is exercising 0 minutes 0 times per week. Arianna deviated with her eating after the holidays. She would like to go back on a structured meal plan to get back on track. Takina requests 4 weeks before next follow up. Her weight is 177 lb (80.3 kg) today and has had a weight gain of 3 pounds over a period of 4 weeks since her last visit. She has lost 11 lbs since starting treatment with [name].  Hypertension ZALEA PETE is a 47 y.o. female with hypertension. Sherrika Weakland Shatz denies chest pain or shortness of breath on exertion. She is working weight loss to help control her blood pressure with the goal of decreasing her risk of heart attack and stroke. [name] blood pressure is currently stable.  At risk for cardiovascular disease Alannah is at a higher than average risk for cardiovascular disease due to obesity and hypertension. She currently denies any chest pain.  Depression with emotional eating behaviors Darriana is struggling with emotional eating and using food for comfort to the extent that it is negatively impacting her health. She often snacks when she is not hungry. Verenice sometimes feels she is out of control and then feels guilty that she made poor food choices. She has been working on behavior modification techniques to help reduce her emotional eating and has been somewhat successful. Her mood is stable and she shows no sign of suicidal or homicidal ideations.  Depression screen PHQ 2/9 08/13/2016  Decreased Interest 1  Down, Depressed, Hopeless 2  PHQ - 2 Score 3  Altered sleeping 1  Tired, decreased energy 3  Change in appetite 3  Feeling bad or failure about yourself  1  Trouble  concentrating 1  Moving slowly or fidgety/restless 1  Suicidal thoughts 0  PHQ-9 Score 13     ALLERGIES: No Known Allergies  MEDICATIONS: Current Outpatient Medications on File Prior to Visit  Medication Sig Dispense Refill  . ibuprofen (ADVIL,MOTRIN) 400 MG tablet Take 1 tablet (400 mg total) by mouth 3 (three) times daily. 30 tablet 0  . magnesium oxide (MAG-OX) 400 MG tablet Take 400 mg by mouth daily.    . SUMAtriptan (IMITREX) 50 MG tablet Take 50 mg by mouth every 2 (two) hours as needed for migraine. May repeat in 2 hours if headache persists or recurs.    . valsartan (DIOVAN) 160 MG tablet Take 40 mg by mouth daily.     . Vitamin D, Ergocalciferol, (DRISDOL) 50000 units CAPS capsule Take 1 capsule (50,000 Units total) by mouth every 7 (seven) days. 4 capsule 0   No current facility-administered medications on file prior to visit.     PAST MEDICAL HISTORY: Past Medical History:  Diagnosis Date  . Anemia   . Asthma    as child  . Back pain   . GERD (gastroesophageal reflux disease)   . Headache   . Hypertension   . Joint pain   . Migraines   . Parathyroid abnormality ([place])   . Swelling    feet and legs  . Vitamin D deficiency     PAST SURGICAL HISTORY: Past Surgical History:  Procedure  Laterality Date  . FOOT SURGERY Bilateral 2001  . PARATHYROIDECTOMY N/A 01/03/2016   Procedure: PARATHYROIDECTOMY;  Surgeon: Leta Baptist, MD;  Location: [facility];  Service: ENT;  Laterality: N/A;  . TUBAL LIGATION      SOCIAL HISTORY: Social History   Tobacco Use  . Smoking status: Never Smoker  . Smokeless tobacco: Never Used  Substance Use Topics  . Alcohol use: No  . Drug use: No    FAMILY HISTORY: Family History  Problem Relation Age of Onset  . Hypertension Father   . Cancer Maternal Grandmother   . Heart disease Paternal Grandmother   . Cancer Paternal Grandmother     ROS: Review of Systems  Constitutional: Negative for weight loss.    Respiratory: Negative for shortness of breath (on exertion).   Cardiovascular: Negative for chest pain.  Psychiatric/Behavioral: Positive for depression. Negative for suicidal ideas.    PHYSICAL EXAM: Blood pressure 117/82, pulse 74, temperature 98.3 F (36.8 C), temperature source Oral, height 5\' 6"  (1.676 m), weight 177 lb (80.3 kg), last menstrual period 04/28/2017, SpO2 97 %. Body mass index is 28.57 kg/m. Physical Exam  Constitutional: She is oriented to person, place, and time. She appears well-developed and well-nourished.  Cardiovascular: Normal rate.  Pulmonary/Chest: Effort normal.  Musculoskeletal: Normal range of motion.  Neurological: She is oriented to person, place, and time.  Skin: Skin is warm and dry.  Psychiatric: She has a normal mood and affect. Her behavior is normal.  Vitals reviewed.   RECENT LABS AND TESTS: BMET    Component Value Date/Time   NA 141 11/10/2016 0824   K 4.2 11/10/2016 0824   CL 103 11/10/2016 0824   CO2 23 11/10/2016 0824   GLUCOSE 83 11/10/2016 0824   BUN 17 11/10/2016 0824   CREATININE 0.85 11/10/2016 0824   CALCIUM 8.9 12/23/2016 0730   CALCIUM 7.5 (L) 01/03/2016 1520   GFRNONAA 82 11/10/2016 0824   GFRAA 95 11/10/2016 0824   Lab Results  Component Value Date   HGBA1C 5.6 11/10/2016   HGBA1C 5.5 08/13/2016   Lab Results  Component Value Date   INSULIN 5.6 11/10/2016   INSULIN 7.9 08/13/2016   CBC    Component Value Date/Time   WBC 7.2 08/13/2016 0928   RBC 4.45 08/13/2016 0928   HGB 12.4 08/13/2016 0928   HCT 40.8 08/13/2016 0928   MCV 92 08/13/2016 0928   MCH 27.9 08/13/2016 0928   MCHC 30.4 (L) 08/13/2016 0928   RDW 13.3 08/13/2016 0928   LYMPHSABS 2.4 08/13/2016 0928   EOSABS 0.1 08/13/2016 0928   BASOSABS 0.0 08/13/2016 0928   Iron/TIBC/Ferritin/ %Sat No results found for: IRON, TIBC, FERRITIN, IRONPCTSAT Lipid Panel     Component Value Date/Time   CHOL 193 11/10/2016 0824   TRIG 61 11/10/2016 0824    HDL 54 11/10/2016 0824   LDLCALC 127 (H) 11/10/2016 0824   Hepatic Function Panel     Component Value Date/Time   PROT 6.7 11/10/2016 0824   ALBUMIN 4.2 11/10/2016 0824   AST 15 11/10/2016 0824   ALT 12 11/10/2016 0824   ALKPHOS 48 11/10/2016 0824   BILITOT 0.2 11/10/2016 0824      Component Value Date/Time   TSH 2.080 08/13/2016 0928    ASSESSMENT AND PLAN: Essential hypertension  Other depression - with emotional eating - Plan: buPROPion (WELLBUTRIN SR) 150 MG 12 hr tablet  At risk for heart disease  Class 1 obesity with serious comorbidity and body  mass index (BMI) of 30.0 to 30.9 in adult, unspecified obesity type - Starting BMI greater then 30  PLAN:  Hypertension We discussed sodium restriction, working on healthy weight loss, and a regular exercise program as the means to achieve improved blood pressure control. Noma agreed with this plan and agreed to follow up as directed. We will continue to monitor her blood pressure as well as her progress with the above lifestyle modifications. She will continue her medications as prescribed and will watch for signs of hypotension as she continues her lifestyle modifications.  Cardiovascular risk counseling Nico was given extended (15 minutes) coronary artery disease prevention counseling today. She is 47 y.o. female and has risk factors for heart disease including obesity and hypertension. We discussed intensive lifestyle modifications today with an emphasis on specific weight loss instructions and strategies. Pt was also informed of the importance of increasing exercise and decreasing saturated fats to help prevent heart disease.  Depression with Emotional Eating Behaviors We discussed behavior modification techniques today to help Emberlie deal with her emotional eating and depression. She has agreed to continue Wellbutrin SR 150 mg qd #30 with no refills and follow up as directed.  Obesity Medrith is currently in the action stage  of change. As such, her goal is to continue with weight loss efforts She has agreed to follow the Category 3 plan Lynnel has been instructed to work up to a goal of 150 minutes of combined cardio and strengthening exercise per week for weight loss and overall health benefits. We discussed the following Behavioral Modification Strategies today: increasing lean protein intake and planning for success  Dalal has agreed to follow up with our clinic in 4 weeks. She was informed of the importance of frequent follow up visits to maximize her success with intensive lifestyle modifications for her multiple health conditions.  Corey Skains, am acting as transcriptionist for Lacy Duverney, PA-C    OBESITY BEHAVIORAL INTERVENTION VISIT  Today's visit was # 19 out of 22.  Starting weight: 188 lbs Starting date: 08/13/16 Today's weight : 177 lbs  Today's date: 07/07/2017 Total lbs lost to date: 11 (Patients must lose 7 lbs in the first 6 months to continue with counseling)   ASK: We discussed the diagnosis of obesity with [name] today and Jalissa agreed to give [name] permission to discuss obesity behavioral modification therapy today.  ASSESS: Simrah has the diagnosis of obesity and her BMI today is 28.58 Cresencia is in the action stage of change   ADVISE: Carolee was educated on the multiple health risks of obesity as well as the benefit of weight loss to improve her health. She was advised of the need for long term treatment and the importance of lifestyle modifications.  AGREE: Multiple dietary modification options and treatment options were discussed and  Jalysa agreed to follow the Category 3 plan We discussed the following Behavioral Modification Strategies today: increasing lean protein intake and planning for success

## 2017-07-12 MED FILL — VALSARTAN 320 MG TABLET: 320 | 90 days supply | Qty: 90 | Fill #1

## 2017-07-19 ENCOUNTER — Ambulatory Visit: Payer: 59

## 2017-08-04 ENCOUNTER — Ambulatory Visit (INDEPENDENT_AMBULATORY_CARE_PROVIDER_SITE_OTHER): Payer: 59 | Admitting: Physician Assistant

## 2017-08-04 ENCOUNTER — Encounter (INDEPENDENT_AMBULATORY_CARE_PROVIDER_SITE_OTHER): Payer: Self-pay | Admitting: Physician Assistant

## 2017-08-04 VITALS — BP 127/87 | HR 79 | Temp 97.7°F | Ht 66.0 in | Wt 174.0 lb

## 2017-08-04 DIAGNOSIS — I10 Essential (primary) hypertension: Secondary | ICD-10-CM

## 2017-08-04 DIAGNOSIS — Z6830 Body mass index (BMI) 30.0-30.9, adult: Secondary | ICD-10-CM

## 2017-08-04 DIAGNOSIS — E669 Obesity, unspecified: Secondary | ICD-10-CM

## 2017-08-04 NOTE — Progress Notes (Signed)
Office: [phone]  /  Fax: [phone]   HPI:   Chief Complaint: OBESITY [name] Lane here to discuss her progress with her obesity treatment plan. She Lane on the Category 3 plan and Lane following her eating plan approximately 95 % of the time. She states she Lane exercising 0 minutes 0 times per week. [name] continues to do well with weight loss. She Lane mindful of her eating and Lane gone back to the structured meal plan. Her weight Lane 174 lb (78.9 kg) today and Lane had a weight loss of 3 pounds over a period of 4 weeks since her last visit. She Lane lost 14 lbs since starting treatment with [name].  Hypertension [name] Lane a 47 y.o. female with hypertension. [name] denies chest pain or shortness of breath on exertion. She Lane working weight loss to help control her blood Lane with the goal of decreasing her risk of heart attack and stroke. [name] Lane stable.  ALLERGIES: No Known Allergies  MEDICATIONS: Current Outpatient Medications on File Prior to Visit  Medication Sig Dispense Refill  . buPROPion (WELLBUTRIN SR) 150 MG 12 hr tablet Take 1 tablet (150 mg total) by mouth every morning. 30 tablet 0  . ibuprofen (ADVIL,MOTRIN) 400 MG tablet Take 1 tablet (400 mg total) by mouth 3 (three) times daily. 30 tablet 0  . magnesium oxide (MAG-OX) 400 MG tablet Take 400 mg by mouth daily.    . SUMAtriptan (IMITREX) 50 MG tablet Take 50 mg by mouth every 2 (two) hours as needed for migraine. May repeat in 2 hours if headache persists or recurs.    . valsartan (DIOVAN) 320 MG tablet Take 160 mg by mouth daily.    . Vitamin D, Ergocalciferol, (DRISDOL) 50000 units CAPS capsule Take 1 capsule (50,000 Units total) by mouth every 7 (seven) days. 4 capsule 0   No current facility-administered medications on file prior to visit.     PAST MEDICAL HISTORY: Past Medical History:  Diagnosis Date  . Anemia   . Asthma    as child  . Back pain   . GERD (gastroesophageal reflux disease)     . Headache   . Hypertension   . Joint pain   . Migraines   . Parathyroid abnormality ([place])   . Swelling    feet and legs  . Vitamin D deficiency     PAST SURGICAL HISTORY: Past Surgical History:  Procedure Laterality Date  . FOOT SURGERY Bilateral 2001  . PARATHYROIDECTOMY N/A 01/03/2016   Procedure: PARATHYROIDECTOMY;  Surgeon: Leta Baptist, MD;  Location: [place];  Service: ENT;  Laterality: N/A;  . TUBAL LIGATION      SOCIAL HISTORY: Social History   Tobacco Use  . Smoking status: Never Smoker  . Smokeless tobacco: Never Used  Substance Use Topics  . Alcohol use: No  . Drug use: No    FAMILY HISTORY: Family History  Problem Relation Age of Onset  . Hypertension Father   . Cancer Maternal Grandmother   . Heart disease Paternal Grandmother   . Cancer Paternal Grandmother     ROS: Review of Systems  Constitutional: Positive for weight loss.  Respiratory: Negative for shortness of breath (on exertion ).   Cardiovascular: Negative for chest pain.    PHYSICAL EXAM: Blood Lane 127/87, pulse 79, temperature 97.7 F (36.5 C), temperature source Oral, height 5\' 6"  (1.676 m), weight 174 lb (78.9 kg), SpO2 98 %. Body mass index  Lane 28.08 kg/m. Physical Exam  Constitutional: She Lane oriented to person, place, and time. She appears well-developed and well-nourished.  Cardiovascular: Normal rate.  Pulmonary/Chest: Effort normal.  Musculoskeletal: Normal range of motion.  Neurological: She Lane oriented to person, place, and time.  Skin: Skin Lane warm and dry.  Psychiatric: She Lane a normal mood and affect. Her behavior Lane normal.  Vitals reviewed.   RECENT LABS AND TESTS: BMET    Component Value Date/Time   NA 141 11/10/2016 0824   K 4.2 11/10/2016 0824   CL 103 11/10/2016 0824   CO2 23 11/10/2016 0824   GLUCOSE 83 11/10/2016 0824   BUN 17 11/10/2016 0824   CREATININE 0.85 11/10/2016 0824   CALCIUM 8.9 12/23/2016 0730   CALCIUM 7.5 (L)  01/03/2016 1520   GFRNONAA 82 11/10/2016 0824   GFRAA 95 11/10/2016 0824   Lab Results  Component Value Date   HGBA1C 5.6 11/10/2016   HGBA1C 5.5 08/13/2016   Lab Results  Component Value Date   INSULIN 5.6 11/10/2016   INSULIN 7.9 08/13/2016   CBC    Component Value Date/Time   WBC 7.2 08/13/2016 0928   RBC 4.45 08/13/2016 0928   HGB 12.4 08/13/2016 0928   HCT 40.8 08/13/2016 0928   MCV 92 08/13/2016 0928   MCH 27.9 08/13/2016 0928   MCHC 30.4 (L) 08/13/2016 0928   RDW 13.3 08/13/2016 0928   LYMPHSABS 2.4 08/13/2016 0928   EOSABS 0.1 08/13/2016 0928   BASOSABS 0.0 08/13/2016 0928   Iron/TIBC/Ferritin/ %Sat No results found for: IRON, TIBC, FERRITIN, IRONPCTSAT Lipid Panel     Component Value Date/Time   CHOL 193 11/10/2016 0824   TRIG 61 11/10/2016 0824   HDL 54 11/10/2016 0824   LDLCALC 127 (H) 11/10/2016 0824   Hepatic Function Panel     Component Value Date/Time   PROT 6.7 11/10/2016 0824   ALBUMIN 4.2 11/10/2016 0824   AST 15 11/10/2016 0824   ALT 12 11/10/2016 0824   ALKPHOS 48 11/10/2016 0824   BILITOT 0.2 11/10/2016 0824      Component Value Date/Time   TSH 2.080 08/13/2016 0928    ASSESSMENT AND PLAN: Essential hypertension  Class 1 obesity with serious comorbidity and body mass index (BMI) of 30.0 to 30.9 in adult, unspecified obesity type - starting BMI >30  PLAN:  Hypertension We discussed sodium restriction, working on healthy weight loss, and a regular exercise program as the means to achieve improved blood Lane control. [place] agreed with this plan and agreed to follow up as directed. We will continue to monitor her blood Lane as well as her progress with the above lifestyle modifications. She agrees to take valsartan 320 mg half tablet daily and will watch for signs of hypotension as she continues her lifestyle modifications.  We spent > than 50% of the 15 minute visit on the counseling as documented in the note.  Obesity [name]  Lane currently in the action stage of change. As such, her goal Lane to continue with weight loss efforts She Lane agreed to follow the Category 3 plan [name] been instructed to work up to a goal of 150 minutes of combined cardio and strengthening exercise per week for weight loss and overall health benefits. We discussed the following Behavioral Modification Strategies today: increasing lean protein intake and work on meal planning and easy cooking plans  [name] Lane agreed to follow up with our clinic in 4 weeks. She was informed of the importance  of frequent follow up visits to maximize her success with intensive lifestyle modifications for her multiple health conditions.   Office: [phone]  /  Fax: [phone]  OBESITY BEHAVIORAL INTERVENTION VISIT  Today's visit was # 20 out of 22.  Starting weight: 188 lbs Starting date: 08/13/16 Today's weight : 174 lbs Today's date: 08/04/2017 Total lbs lost to date: 14 (Patients must lose 7 lbs in the first 6 months to continue with counseling)   ASK: We discussed the diagnosis of obesity with [name] today and [name] agreed to give [name] permission to discuss obesity behavioral modification therapy today.  ASSESS: [name] Lane the diagnosis of obesity and her BMI today Lane 28.1 [name] in the action stage of change   ADVISE: [name] was educated on the multiple health risks of obesity as well as the benefit of weight loss to improve her health. She was advised of the need for long term treatment and the importance of lifestyle modifications.  AGREE: Multiple dietary modification options and treatment options were discussed and  [name] agreed to the above obesity treatment plan.   Corey Skains, am acting as transcriptionist for [employer], PA-C I, Lacy Duverney [facility], have reviewed this note and agree with its content.

## 2017-08-05 ENCOUNTER — Encounter: Payer: Self-pay | Admitting: Radiology

## 2017-08-05 ENCOUNTER — Ambulatory Visit
Admission: RE | Admit: 2017-08-05 | Discharge: 2017-08-05 | Disposition: A | Discharge status: Home / Self Care | Payer: 59 | Source: Ambulatory Visit | Attending: Obstetrics and Gynecology | Admitting: Obstetrics and Gynecology

## 2017-08-05 DIAGNOSIS — Z1231 Encounter for screening mammogram for malignant neoplasm of breast: Secondary | ICD-10-CM

## 2017-08-05 MED FILL — AZITHROMYCIN 250 MG TABLET: 250 | 5 days supply | Qty: 6 | Fill #0

## 2017-08-05 MED FILL — METHYLPREDNISOLONE 4 MG TAB: 4 | 6 days supply | Qty: 21 | Fill #0

## 2017-08-06 MED FILL — VIT D2 1.25 MG (50,000 UNIT: 1.25 MG | 28 days supply | Qty: 4 | Fill #0

## 2017-09-01 ENCOUNTER — Ambulatory Visit (INDEPENDENT_AMBULATORY_CARE_PROVIDER_SITE_OTHER): Payer: 59 | Admitting: Family Medicine

## 2017-09-01 VITALS — BP 137/99 | HR 85 | Temp 98.0°F | Ht 66.0 in | Wt 176.0 lb

## 2017-09-01 DIAGNOSIS — E669 Obesity, unspecified: Secondary | ICD-10-CM

## 2017-09-01 DIAGNOSIS — E559 Vitamin D deficiency, unspecified: Secondary | ICD-10-CM | POA: Diagnosis not present

## 2017-09-01 DIAGNOSIS — E8881 Metabolic syndrome: Secondary | ICD-10-CM | POA: Diagnosis not present

## 2017-09-01 DIAGNOSIS — Z6830 Body mass index (BMI) 30.0-30.9, adult: Secondary | ICD-10-CM | POA: Diagnosis not present

## 2017-09-01 DIAGNOSIS — Z9189 Other specified personal risk factors, not elsewhere classified: Secondary | ICD-10-CM

## 2017-09-01 DIAGNOSIS — E7849 Other hyperlipidemia: Secondary | ICD-10-CM | POA: Diagnosis not present

## 2017-09-01 DIAGNOSIS — E88819 Insulin resistance, unspecified: Secondary | ICD-10-CM

## 2017-09-01 HISTORY — DX: Metabolic syndrome: E88.81

## 2017-09-01 HISTORY — DX: Insulin resistance, unspecified: E88.819

## 2017-09-01 HISTORY — DX: Other hyperlipidemia: E78.49

## 2017-09-01 MED ORDER — VITAMIN D (ERGOCALCIFEROL) 1.25 MG (50000 UNIT) PO CAPS: 50000.0000 [IU] | ORAL_CAPSULE | ORAL | 0 refills | Status: DC

## 2017-09-01 MED FILL — VIT D2 1.25 MG (50,000 UNIT: 1.25 MG | 28 days supply | Qty: 4 | Fill #0

## 2017-09-01 NOTE — Progress Notes (Signed)
Office: [phone]  /  Fax: [phone]   HPI:   Chief Complaint: OBESITY [name] is here to discuss her progress with her obesity treatment plan. She is on the Category 3 plan and is following her eating plan approximately 90 to 95 % of the time. She states she is exercising 0 minutes 0 times per week. [name] is struggling lately and is starting to regain weight. Her first goal is 170 lbs and she goes back and forth between journaling and controlling portions and making smarter food choices. Her weight is 176 lb (79.8 kg) today and has had a weight gain of 2 pounds over a period of 4 weeks since her last visit. She has lost 12 lbs since starting treatment with [name].  Vitamin D deficiency [name] has a diagnosis of vitamin D deficiency. She is on prescription vit D and last level was at goal. [name] is due for labs. She denies nausea, vomiting or muscle weakness.   Ref. Range 11/10/2016 08:24  Vitamin D, 25-Hydroxy Latest Ref Range: 30.0 - 100.0 ng/mL 54.3   At risk for osteopenia and osteoporosis [name] is at higher risk of osteopenia and osteoporosis due to vitamin D deficiency.   Insulin Resistance [name] has a diagnosis of insulin resistance based on her elevated fasting insulin level >5. Although [name] blood glucose readings are still under good control, insulin resistance puts her at greater risk of metabolic syndrome and diabetes. She is not taking metformin currently and is attempting to control insulin resistance with diet and exercise to decrease risk of diabetes.  Hyperlipidemia [name] has hyperlipidemia and is not on statin. She is attempting to control her cholesterol levels with intensive lifestyle modification including a low saturated fat diet, exercise and weight loss. She denies any chest pain, claudication or myalgias.  ALLERGIES: No Known Allergies  MEDICATIONS: Current Outpatient Medications on File Prior to Visit  Medication Sig Dispense Refill   buPROPion (WELLBUTRIN SR)  150 MG 12 hr tablet Take 1 tablet (150 mg total) by mouth every morning. 30 tablet 0   ibuprofen (ADVIL,MOTRIN) 400 MG tablet Take 1 tablet (400 mg total) by mouth 3 (three) times daily. 30 tablet 0   magnesium oxide (MAG-OX) 400 MG tablet Take 400 mg by mouth daily.     SUMAtriptan (IMITREX) 50 MG tablet Take 50 mg by mouth every 2 (two) hours as needed for migraine. May repeat in 2 hours if headache persists or recurs.     valsartan (DIOVAN) 320 MG tablet Take 160 mg by mouth daily.     Vitamin D, Ergocalciferol, (DRISDOL) 50000 units CAPS capsule Take 1 capsule (50,000 Units total) by mouth every 7 (seven) days. 4 capsule 0   No current facility-administered medications on file prior to visit.     PAST MEDICAL HISTORY: Past Medical History:  Diagnosis Date   Anemia    Asthma    as child   Back pain    GERD (gastroesophageal reflux disease)    Headache    Hypertension    Joint pain    Migraines    Parathyroid abnormality ([facility])    Swelling    feet and legs   Vitamin D deficiency     PAST SURGICAL HISTORY: Past Surgical History:  Procedure Laterality Date   FOOT SURGERY Bilateral 2001   PARATHYROIDECTOMY N/A 01/03/2016   Procedure: PARATHYROIDECTOMY;  Surgeon: Leta Baptist, MD;  Location: [place];  Service: ENT;  Laterality: N/A;   TUBAL LIGATION  SOCIAL HISTORY: Social History   Tobacco Use   Smoking status: Never Smoker   Smokeless tobacco: Never Used  Substance Use Topics   Alcohol use: No   Drug use: No    FAMILY HISTORY: Family History  Problem Relation Age of Onset   Hypertension Father    Cancer Maternal Grandmother    Heart disease Paternal Grandmother    Cancer Paternal Grandmother     ROS: Review of Systems  Constitutional: Negative for weight loss.  Cardiovascular: Negative for chest pain and claudication.  Gastrointestinal: Negative for nausea and vomiting.  Musculoskeletal: Negative for myalgias.        Negative for muscle weakness    PHYSICAL EXAM: Blood pressure (!) 137/99, pulse 85, temperature 98 F (36.7 C), temperature source Oral, height 5\' 6"  (1.676 m), weight 176 lb (79.8 kg), SpO2 98 %. Body mass index is 28.41 kg/m. Physical Exam  Constitutional: She is oriented to person, place, and time. She appears well-developed and well-nourished.  Cardiovascular: Normal rate.  Pulmonary/Chest: Effort normal.  Musculoskeletal: Normal range of motion.  Neurological: She is oriented to person, place, and time.  Skin: Skin is warm and dry.  Psychiatric: She has a normal mood and affect.  Vitals reviewed.   RECENT LABS AND TESTS: BMET    Component Value Date/Time   NA 141 11/10/2016 0824   K 4.2 11/10/2016 0824   CL 103 11/10/2016 0824   CO2 23 11/10/2016 0824   GLUCOSE 83 11/10/2016 0824   BUN 17 11/10/2016 0824   CREATININE 0.85 11/10/2016 0824   CALCIUM 8.9 12/23/2016 0730   CALCIUM 7.5 (L) 01/03/2016 1520   GFRNONAA 82 11/10/2016 0824   GFRAA 95 11/10/2016 0824   Lab Results  Component Value Date   HGBA1C 5.6 11/10/2016   HGBA1C 5.5 08/13/2016   Lab Results  Component Value Date   INSULIN 5.6 11/10/2016   INSULIN 7.9 08/13/2016   CBC    Component Value Date/Time   WBC 7.2 08/13/2016 0928   RBC 4.45 08/13/2016 0928   HGB 12.4 08/13/2016 0928   HCT 40.8 08/13/2016 0928   MCV 92 08/13/2016 0928   MCH 27.9 08/13/2016 0928   MCHC 30.4 (L) 08/13/2016 0928   RDW 13.3 08/13/2016 0928   LYMPHSABS 2.4 08/13/2016 0928   EOSABS 0.1 08/13/2016 0928   BASOSABS 0.0 08/13/2016 0928   Iron/TIBC/Ferritin/ %Sat No results found for: IRON, TIBC, FERRITIN, IRONPCTSAT Lipid Panel     Component Value Date/Time   CHOL 193 11/10/2016 0824   TRIG 61 11/10/2016 0824   HDL 54 11/10/2016 0824   LDLCALC 127 (H) 11/10/2016 0824   Hepatic Function Panel     Component Value Date/Time   PROT 6.7 11/10/2016 0824   ALBUMIN 4.2 11/10/2016 0824   AST 15 11/10/2016 0824   ALT  12 11/10/2016 0824   ALKPHOS 48 11/10/2016 0824   BILITOT 0.2 11/10/2016 0824      Component Value Date/Time   TSH 2.080 08/13/2016 0928    ASSESSMENT AND PLAN: Vitamin D deficiency - Plan: VITAMIN D 25 Hydroxy (Vit-D Deficiency, Fractures), Vitamin D, Ergocalciferol, (DRISDOL) 50000 units CAPS capsule  Other hyperlipidemia - Plan: Lipid Panel With LDL/HDL Ratio  Insulin resistance - Plan: Comprehensive metabolic panel, Hemoglobin A1c, Insulin, random  At risk for osteoporosis  Class 1 obesity with serious comorbidity and body mass index (BMI) of 30.0 to 30.9 in adult, unspecified obesity type - Starting BMI greater then 30  PLAN:  Vitamin D Deficiency  Marlow was informed that low vitamin D levels contributes to fatigue and are associated with obesity, breast, and colon cancer. She agrees to continue to take prescription Vit D @50 ,000 IU every week #4 with no refills. We will check labs and she  will follow up for routine testing of vitamin D, at least 2-3 times per year. She was informed of the risk of over-replacement of vitamin D and agrees to not increase her dose unless she discusses this with [name] first. [name] agrees to follow up with our clinic in 2 weeks.  At risk for osteopenia and osteoporosis [name] is at risk for osteopenia and osteoporosis due to her vitamin D deficiency. She was encouraged to take her vitamin D and follow her higher calcium diet and increase strengthening exercise to help strengthen her bones and decrease her risk of osteopenia and osteoporosis.  Insulin Resistance [name] will continue to work on weight loss, exercise, and decreasing simple carbohydrates in her diet to help decrease the risk of diabetes. She was informed that eating too many simple carbohydrates or too many calories at one sitting increases the likelihood of GI side effects. We will check labs and [name] agreed to follow up with [name] as directed to monitor her progress.  Hyperlipidemia [name] was  informed of the American Heart Association Guidelines emphasizing intensive lifestyle modifications as the first line treatment for hyperlipidemia. We discussed many lifestyle modifications today in depth, and [name] will continue to work on decreasing saturated fats such as fatty red meat, butter and many fried foods. She will also increase vegetables and lean protein in her diet and continue to work on exercise and weight loss efforts. We will check labs and [name] agrees to follow up as directed.  Obesity [name] is currently in the action stage of change. As such, her goal is to continue with weight loss efforts She has agreed to keep a food journal with 1200 to 1300 calories and 80+ grams of protein daily [name] has been instructed to work up to a goal of 150 minutes of combined cardio and strengthening exercise per week for weight loss and overall health benefits. We discussed the following Behavioral Modification Strategies today: increasing lean protein intake, increase H2O intake and keep a strict food journal  [name] has agreed to follow up with our clinic in 2 weeks. She was informed of the importance of frequent follow up visits to maximize her success with intensive lifestyle modifications for her multiple health conditions.   OBESITY BEHAVIORAL INTERVENTION VISIT  Today's visit was # 21 out of 22.  Starting weight: 188 lbs Starting date: 08/13/16 Today's weight : 176 lbs Today's date: 09/01/2017 Total lbs lost to date: 12 (Patients must lose 7 lbs in the first 6 months to continue with counseling)   ASK: We discussed the diagnosis of obesity with [name] today and [name] agreed to give [name] permission to discuss obesity behavioral modification therapy today.  ASSESS: Shaakira has the diagnosis of obesity and her BMI today is 28.42 Pierce is in the action stage of change   ADVISE: Marketta was educated on the multiple health risks of obesity as well as the benefit of weight loss to improve her  health. She was advised of the need for long term treatment and the importance of lifestyle modifications.  AGREE: Multiple dietary modification options and treatment options were discussed and  Amzie agreed to the above obesity treatment plan.  IDoreene Nest, am acting as Location manager for [employer],  MD  I have reviewed the above documentation for accuracy and completeness, and I agree with the above. -Dennard Nip, MD

## 2017-09-02 LAB — LIPID PANEL WITH LDL/HDL RATIO
Cholesterol, Total: 195 mg/dL (ref 100–199)
HDL: 60 mg/dL (ref >39)
LDL CALC: 121 mg/dL — AB (ref 0–99)
LDl/HDL Ratio: 2 ratio (ref 0.0–3.2)
TRIGLYCERIDES: 70 mg/dL (ref 0–149)
VLDL Cholesterol Cal: 14 mg/dL (ref 5–40)

## 2017-09-02 LAB — COMPREHENSIVE METABOLIC PANEL
ALBUMIN: 4.4 g/dL (ref 3.5–5.5)
ALK PHOS: 61 IU/L (ref 39–117)
ALT: 20 IU/L (ref 0–32)
AST: 19 IU/L (ref 0–40)
Albumin/Globulin Ratio: 1.6 (ref 1.2–2.2)
BUN / CREAT RATIO: 12 (ref 9–23)
BUN: 12 mg/dL (ref 6–24)
Bilirubin Total: <0.2 mg/dL (ref 0.0–1.2)
CHLORIDE: 105 mmol/L (ref 96–106)
CO2: 25 mmol/L (ref 20–29)
CREATININE: 1.03 mg/dL — AB (ref 0.57–1.00)
Calcium: 8.9 mg/dL (ref 8.7–10.2)
GFR calc Af Amer: 75 mL/min/{1.73_m2} (ref >59)
GFR calc non Af Amer: 65 mL/min/{1.73_m2} (ref >59)
GLUCOSE: 85 mg/dL (ref 65–99)
Globulin, Total: 2.7 g/dL (ref 1.5–4.5)
Potassium: 4.6 mmol/L (ref 3.5–5.2)
Sodium: 143 mmol/L (ref 134–144)
TOTAL PROTEIN: 7.1 g/dL (ref 6.0–8.5)

## 2017-09-02 LAB — VITAMIN D 25 HYDROXY (VIT D DEFICIENCY, FRACTURES): Vit D, 25-Hydroxy: 48.6 ng/mL (ref 30.0–100.0)

## 2017-09-02 LAB — INSULIN, RANDOM: INSULIN: 3.7 u[IU]/mL (ref 2.6–24.9)

## 2017-09-02 LAB — HEMOGLOBIN A1C
Est. average glucose Bld gHb Est-mCnc: 111 mg/dL
HEMOGLOBIN A1C: 5.5 % (ref 4.8–5.6)

## 2017-09-08 MED FILL — SUMATRIPTAN SUCC 50 MG TABL: 50 | 90 days supply | Qty: 27 | Fill #0

## 2017-09-20 ENCOUNTER — Ambulatory Visit (INDEPENDENT_AMBULATORY_CARE_PROVIDER_SITE_OTHER): Payer: 59 | Admitting: Family Medicine

## 2017-09-20 VITALS — BP 137/84 | HR 66 | Temp 97.9°F | Ht 66.0 in | Wt 174.0 lb

## 2017-09-20 DIAGNOSIS — I10 Essential (primary) hypertension: Secondary | ICD-10-CM

## 2017-09-20 DIAGNOSIS — E669 Obesity, unspecified: Secondary | ICD-10-CM

## 2017-09-20 DIAGNOSIS — Z9189 Other specified personal risk factors, not elsewhere classified: Secondary | ICD-10-CM | POA: Diagnosis not present

## 2017-09-20 DIAGNOSIS — Z6830 Body mass index (BMI) 30.0-30.9, adult: Secondary | ICD-10-CM | POA: Diagnosis not present

## 2017-09-20 NOTE — Progress Notes (Signed)
Office: [phone]  /  Fax: [phone]   HPI:   Chief Complaint: OBESITY [name] is here to discuss her progress with her obesity treatment plan. She is on the Pescatarian eating plan and is following her eating plan approximately 95 % of the time. She states she is exercising 0 minutes 0 times per week. [name] has been doing better with weight loss, changed to [place] and likes it better. She is meeting her protein goals and hunger is controlled.  Her weight is 174 lb (78.9 kg) today and has had a weight loss of 2 pounds over a period of 2 to 3 weeks since her last visit. She has lost 14 lbs since starting treatment with [name].  Hypertension [name] is a 47 y.o. female with hypertension. [name] is trying to diet control and has been decreasing her dose of Diovan to QOD. Her blood pressure has elevated but the way she is taking her Diovan may be causing a reload. She denies chest pain. She is working weight loss to help control her blood pressure with the goal of decreasing her risk of heart attack and stroke. [name]'s blood pressure is not currently controlled.  At risk for cardiovascular disease [name] is at a higher than average risk for cardiovascular disease due to obesity and hypertension. She currently denies any chest pain.  ALLERGIES: No Known Allergies  MEDICATIONS: Current Outpatient Medications on File Prior to Visit  Medication Sig Dispense Refill  . buPROPion (WELLBUTRIN SR) 150 MG 12 hr tablet Take 1 tablet (150 mg total) by mouth every morning. 30 tablet 0  . ibuprofen (ADVIL,MOTRIN) 400 MG tablet Take 1 tablet (400 mg total) by mouth 3 (three) times daily. 30 tablet 0  . magnesium oxide (MAG-OX) 400 MG tablet Take 400 mg by mouth daily.    . SUMAtriptan (IMITREX) 50 MG tablet Take 50 mg by mouth every 2 (two) hours as needed for migraine. May repeat in 2 hours if headache persists or recurs.    . Vitamin D, Ergocalciferol, (DRISDOL) 50000 units CAPS capsule Take 1  capsule (50,000 Units total) by mouth every 7 (seven) days. 4 capsule 0   No current facility-administered medications on file prior to visit.     PAST MEDICAL HISTORY: Past Medical History:  Diagnosis Date  . Anemia   . Asthma    as child  . Back pain   . GERD (gastroesophageal reflux disease)   . Headache   . Hypertension   . Joint pain   . Migraines   . Parathyroid abnormality ([place])   . Swelling    feet and legs  . Vitamin D deficiency     PAST SURGICAL HISTORY: Past Surgical History:  Procedure Laterality Date  . FOOT SURGERY Bilateral 2001  . PARATHYROIDECTOMY N/A 01/03/2016   Procedure: PARATHYROIDECTOMY;  Surgeon: Leta Baptist, MD;  Location: [facility];  Service: ENT;  Laterality: N/A;  . TUBAL LIGATION      SOCIAL HISTORY: Social History   Tobacco Use  . Smoking status: Never Smoker  . Smokeless tobacco: Never Used  Substance Use Topics  . Alcohol use: No  . Drug use: No    FAMILY HISTORY: Family History  Problem Relation Age of Onset  . Hypertension Father   . Cancer Maternal Grandmother   . Heart disease Paternal Grandmother   . Cancer Paternal Grandmother     ROS: Review of Systems  Constitutional: Positive for weight loss.  Cardiovascular: Negative for chest  pain.    PHYSICAL EXAM: Blood pressure 137/84, pulse 66, temperature 97.9 F (36.6 C), temperature source Oral, height 5\' 6"  (1.676 m), weight 174 lb (78.9 kg), SpO2 99 %. Body mass index is 28.08 kg/m. Physical Exam  Constitutional: She is oriented to person, place, and time. She appears well-developed and well-nourished.  Cardiovascular: Normal rate.  Pulmonary/Chest: Effort normal.  Musculoskeletal: Normal range of motion.  Neurological: She is oriented to person, place, and time.  Skin: Skin is warm and dry.  Psychiatric: She has a normal mood and affect. Her behavior is normal.  Vitals reviewed.   RECENT LABS AND TESTS: BMET    Component Value Date/Time   NA  143 09/01/2017 0759   K 4.6 09/01/2017 0759   CL 105 09/01/2017 0759   CO2 25 09/01/2017 0759   GLUCOSE 85 09/01/2017 0759   BUN 12 09/01/2017 0759   CREATININE 1.03 (H) 09/01/2017 0759   CALCIUM 8.9 09/01/2017 0759   CALCIUM 7.5 (L) 01/03/2016 1520   GFRNONAA 65 09/01/2017 0759   GFRAA 75 09/01/2017 0759   Lab Results  Component Value Date   HGBA1C 5.5 09/01/2017   HGBA1C 5.6 11/10/2016   HGBA1C 5.5 08/13/2016   Lab Results  Component Value Date   INSULIN 3.7 09/01/2017   INSULIN 5.6 11/10/2016   INSULIN 7.9 08/13/2016   CBC    Component Value Date/Time   WBC 7.2 08/13/2016 0928   RBC 4.45 08/13/2016 0928   HGB 12.4 08/13/2016 0928   HCT 40.8 08/13/2016 0928   MCV 92 08/13/2016 0928   MCH 27.9 08/13/2016 0928   MCHC 30.4 (L) 08/13/2016 0928   RDW 13.3 08/13/2016 0928   LYMPHSABS 2.4 08/13/2016 0928   EOSABS 0.1 08/13/2016 0928   BASOSABS 0.0 08/13/2016 0928   Iron/TIBC/Ferritin/ %Sat No results found for: IRON, TIBC, FERRITIN, IRONPCTSAT Lipid Panel     Component Value Date/Time   CHOL 195 09/01/2017 0759   TRIG 70 09/01/2017 0759   HDL 60 09/01/2017 0759   LDLCALC 121 (H) 09/01/2017 0759   Hepatic Function Panel     Component Value Date/Time   PROT 7.1 09/01/2017 0759   ALBUMIN 4.4 09/01/2017 0759   AST 19 09/01/2017 0759   ALT 20 09/01/2017 0759   ALKPHOS 61 09/01/2017 0759   BILITOT <0.2 09/01/2017 0759      Component Value Date/Time   TSH 2.080 08/13/2016 0928    ASSESSMENT AND PLAN: Essential hypertension  At risk for heart disease  Class 1 obesity with serious comorbidity and body mass index (BMI) of 30.0 to 30.9 in adult, unspecified obesity type - Starting BMI greater then 30  PLAN:  Hypertension We discussed sodium restriction, working on healthy weight loss, and a regular exercise program as the means to achieve improved blood pressure control. [name] agreed with this plan and agreed to follow up as directed. We will continue to  monitor her blood pressure as well as her progress with the above lifestyle modifications. [name] agrees to discontinue Diovan and continue with diet and exercise and she will watch for signs of hypotension as she continues her lifestyle modifications. [name] agrees to follow up with our clinic in 2 weeks and we will recheck blood pressure at that time.  Cardiovascular risk counselling [name] was given extended (15 minutes) coronary artery disease prevention counseling today. She is 47 y.o. female and has risk factors for heart disease including obesity and hypertension. We discussed intensive lifestyle modifications today with an emphasis on  specific weight loss instructions and strategies. Pt was also informed of the importance of increasing exercise and decreasing saturated fats to help prevent heart disease.  Obesity [name] is currently in the action stage of change. As such, her goal is to continue with weight loss efforts She has agreed to follow the Pescatarian eating plan [name] has been instructed to work up to a goal of 150 minutes of combined cardio and strengthening exercise per week or walk the treadmill for 20 minutes 1-2 times per week for weight loss and overall health benefits. We discussed the following Behavioral Modification Strategies today: increasing lean protein intake, emotional eating strategies, and better snacking choices   [name] has agreed to follow up with our clinic in 2 weeks. She was informed of the importance of frequent follow up visits to maximize her success with intensive lifestyle modifications for her multiple health conditions.   OBESITY BEHAVIORAL INTERVENTION VISIT  Today's visit was # 22 out of 22.  Starting weight: 188 lbs Starting date: 08/13/16 Today's weight : 174 lbs  Today's date: 09/20/2017 Total lbs lost to date: 14 (Patients must lose 7 lbs in the first 6 months to continue with counseling)   ASK: We discussed the diagnosis of obesity with [name] today and [name] agreed to give [name] permission to discuss obesity behavioral modification therapy today.  ASSESS: [name] has the diagnosis of obesity and her BMI today is 28.1 [name] is in the action stage of change   ADVISE: [name] was educated on the multiple health risks of obesity as well as the benefit of weight loss to improve her health. She was advised of the need for long term treatment and the importance of lifestyle modifications.  AGREE: Multiple dietary modification options and treatment options were discussed and  [name] agreed to the above obesity treatment plan.  I, Trixie Dredge, am acting as transcriptionist for Dennard Nip, MD  I have reviewed the above documentation for accuracy and completeness, and I agree with the above. -Dennard Nip, MD

## 2017-10-06 ENCOUNTER — Ambulatory Visit (INDEPENDENT_AMBULATORY_CARE_PROVIDER_SITE_OTHER): Payer: 59 | Admitting: Physician Assistant

## 2017-10-06 VITALS — BP 129/80 | HR 62 | Temp 98.0°F | Ht 66.0 in | Wt 176.0 lb

## 2017-10-06 DIAGNOSIS — Z9189 Other specified personal risk factors, not elsewhere classified: Secondary | ICD-10-CM | POA: Diagnosis not present

## 2017-10-06 DIAGNOSIS — Z6830 Body mass index (BMI) 30.0-30.9, adult: Secondary | ICD-10-CM

## 2017-10-06 DIAGNOSIS — E669 Obesity, unspecified: Secondary | ICD-10-CM

## 2017-10-06 DIAGNOSIS — E559 Vitamin D deficiency, unspecified: Secondary | ICD-10-CM

## 2017-10-06 DIAGNOSIS — F3289 Other specified depressive episodes: Secondary | ICD-10-CM

## 2017-10-06 MED ORDER — VITAMIN D (ERGOCALCIFEROL) 1.25 MG (50000 UNIT) PO CAPS: 50000.0000 [IU] | ORAL_CAPSULE | ORAL | 0 refills | Status: DC

## 2017-10-07 DIAGNOSIS — H524 Presbyopia: Secondary | ICD-10-CM | POA: Diagnosis not present

## 2017-10-07 DIAGNOSIS — H52221 Regular astigmatism, right eye: Secondary | ICD-10-CM | POA: Diagnosis not present

## 2017-10-07 DIAGNOSIS — H5213 Myopia, bilateral: Secondary | ICD-10-CM | POA: Diagnosis not present

## 2017-10-07 MED FILL — VIT D2 1.25 MG (50,000 UNIT: 1.25 MG | 28 days supply | Qty: 4 | Fill #0

## 2017-10-07 NOTE — Progress Notes (Signed)
Office: [phone]  /  Fax: [phone]   HPI:   Chief Complaint: OBESITY [name] is here to discuss her progress with her obesity treatment plan. She is on the Pescatarian eating plan and is following her eating plan approximately 95 % of the time. She states she is walking for 30 minutes 2 times per week. [name] is retaining fluids. She has been mindful of her eating and states her hunger and cravings are well controlled.  Her weight is 176 lb (79.8 kg) today and has gained 2 pounds since her last visit. She has lost 12 lbs since starting treatment with [name].  Vitamin D Deficiency [name] has a diagnosis of vitamin D deficiency. She is currently taking prescription Vit D and denies nausea, vomiting or muscle weakness.  At risk for osteopenia and osteoporosis [name] is at higher risk of osteopenia and osteoporosis due to vitamin D deficiency.   Depression with emotional eating behaviors [name]'s mood is stable and she states she has discontinued Wellbutrin. [name] struggles with emotional eating and using food for comfort to the extent that it is negatively impacting her health. She often snacks when she is not hungry. [name] sometimes feels she is out of control and then feels guilty that she made poor food choices. She has been working on behavior modification techniques to help reduce her emotional eating and has been somewhat successful. She shows no sign of suicidal or homicidal ideations.  Depression screen PHQ 2/9 08/13/2016  Decreased Interest 1  Down, Depressed, Hopeless 2  PHQ - 2 Score 3  Altered sleeping 1  Tired, decreased energy 3  Change in appetite 3  Feeling bad or failure about yourself  1  Trouble concentrating 1  Moving slowly or fidgety/restless 1  Suicidal thoughts 0  PHQ-9 Score 13   ALLERGIES: No Known Allergies  MEDICATIONS: Current Outpatient Medications on File Prior to Visit  Medication Sig Dispense Refill  . ibuprofen (ADVIL,MOTRIN) 400 MG tablet Take 1 tablet  (400 mg total) by mouth 3 (three) times daily. 30 tablet 0  . SUMAtriptan (IMITREX) 50 MG tablet Take 50 mg by mouth every 2 (two) hours as needed for migraine. May repeat in 2 hours if headache persists or recurs.     No current facility-administered medications on file prior to visit.     PAST MEDICAL HISTORY: Past Medical History:  Diagnosis Date  . Anemia   . Asthma    as child  . Back pain   . GERD (gastroesophageal reflux disease)   . Headache   . Hypertension   . Joint pain   . Migraines   . Parathyroid abnormality ([place])   . Swelling    feet and legs  . Vitamin D deficiency     PAST SURGICAL HISTORY: Past Surgical History:  Procedure Laterality Date  . FOOT SURGERY Bilateral 2001  . PARATHYROIDECTOMY N/A 01/03/2016   Procedure: PARATHYROIDECTOMY;  Surgeon: Leta Baptist, MD;  Location: [facility];  Service: ENT;  Laterality: N/A;  . TUBAL LIGATION      SOCIAL HISTORY: Social History   Tobacco Use  . Smoking status: Never Smoker  . Smokeless tobacco: Never Used  Substance Use Topics  . Alcohol use: No  . Drug use: No    FAMILY HISTORY: Family History  Problem Relation Age of Onset  . Hypertension Father   . Cancer Maternal Grandmother   . Heart disease Paternal Grandmother   . Cancer Paternal Grandmother     ROS: Review  of Systems  Constitutional: Negative for weight loss.  Gastrointestinal: Negative for nausea and vomiting.  Musculoskeletal:       Negative muscle weakness  Psychiatric/Behavioral: Positive for depression. Negative for suicidal ideas.    PHYSICAL EXAM: Blood pressure 129/80, pulse 62, temperature 98 F (36.7 C), temperature source Oral, height 5\' 6"  (1.676 m), weight 176 lb (79.8 kg), SpO2 98 %. Body mass index is 28.41 kg/m. Physical Exam  Constitutional: She is oriented to person, place, and time. She appears well-developed and well-nourished.  Cardiovascular: Normal rate.  Pulmonary/Chest: Effort normal.    Musculoskeletal: Normal range of motion.  Neurological: She is oriented to person, place, and time.  Skin: Skin is warm and dry.  Psychiatric: She has a normal mood and affect. Her behavior is normal.  Vitals reviewed.   RECENT LABS AND TESTS: BMET    Component Value Date/Time   NA 143 09/01/2017 0759   K 4.6 09/01/2017 0759   CL 105 09/01/2017 0759   CO2 25 09/01/2017 0759   GLUCOSE 85 09/01/2017 0759   BUN 12 09/01/2017 0759   CREATININE 1.03 (H) 09/01/2017 0759   CALCIUM 8.9 09/01/2017 0759   CALCIUM 7.5 (L) 01/03/2016 1520   GFRNONAA 65 09/01/2017 0759   GFRAA 75 09/01/2017 0759   Lab Results  Component Value Date   HGBA1C 5.5 09/01/2017   HGBA1C 5.6 11/10/2016   HGBA1C 5.5 08/13/2016   Lab Results  Component Value Date   INSULIN 3.7 09/01/2017   INSULIN 5.6 11/10/2016   INSULIN 7.9 08/13/2016   CBC    Component Value Date/Time   WBC 7.2 08/13/2016 0928   RBC 4.45 08/13/2016 0928   HGB 12.4 08/13/2016 0928   HCT 40.8 08/13/2016 0928   MCV 92 08/13/2016 0928   MCH 27.9 08/13/2016 0928   MCHC 30.4 (L) 08/13/2016 0928   RDW 13.3 08/13/2016 0928   LYMPHSABS 2.4 08/13/2016 0928   EOSABS 0.1 08/13/2016 0928   BASOSABS 0.0 08/13/2016 0928   Iron/TIBC/Ferritin/ %Sat No results found for: IRON, TIBC, FERRITIN, IRONPCTSAT Lipid Panel     Component Value Date/Time   CHOL 195 09/01/2017 0759   TRIG 70 09/01/2017 0759   HDL 60 09/01/2017 0759   LDLCALC 121 (H) 09/01/2017 0759   Hepatic Function Panel     Component Value Date/Time   PROT 7.1 09/01/2017 0759   ALBUMIN 4.4 09/01/2017 0759   AST 19 09/01/2017 0759   ALT 20 09/01/2017 0759   ALKPHOS 61 09/01/2017 0759   BILITOT <0.2 09/01/2017 0759      Component Value Date/Time   TSH 2.080 08/13/2016 0928  Results for Majkowski, [name] (MRN [id number]) as of 10/07/2017 09:53  Ref. Range 09/01/2017 07:59  Vitamin D, 25-Hydroxy Latest Ref Range: 30.0 - 100.0 ng/mL 48.6    ASSESSMENT AND PLAN: Vitamin D  deficiency - Plan: Vitamin D, Ergocalciferol, (DRISDOL) 50000 units CAPS capsule  Other depression - with emotional eating  At risk for osteoporosis  Class 1 obesity with serious comorbidity and body mass index (BMI) of 30.0 to 30.9 in adult, unspecified obesity type - Starting BMI greater then 30  PLAN:  Vitamin D Deficiency [name] was informed that low vitamin D levels contributes to fatigue and are associated with obesity, breast, and colon cancer. [name] agrees to continue taking prescription Vit D @50 ,000 IU every week #4 and we will refill for 1 month. She will follow up for routine testing of vitamin D, at least 2-3 times per year. She  was informed of the risk of over-replacement of vitamin D and agrees to not increase her dose unless she discusses this with [name] first. [name] agrees to follow up with our clinic in 2 weeks.  At risk for osteopenia and osteoporosis [name] is at risk for osteopenia and osteoporsis due to her vitamin D deficiency. She was encouraged to take her vitamin D and follow her higher calcium diet and increase strengthening exercise to help strengthen her bones and decrease her risk of osteopenia and osteoporosis.  Depression with Emotional Eating Behaviors We discussed behavior modification techniques today to help [name] deal with her emotional eating and depression. [name] agrees to discontinue Wellbutrin and she agrees to follow up with our clinic in 2 weeks.  Obesity [name] is currently in the action stage of change. As such, her goal is to continue with weight loss efforts She has agreed to follow the Pescatarian eating plan [name] has been instructed to work up to a goal of 150 minutes of combined cardio and strengthening exercise per week for weight loss and overall health benefits. We discussed the following Behavioral Modification Strategies today: increasing lean protein intake and work on meal planning and easy cooking plans   [name] has agreed to follow up with our  clinic in 2 weeks. She was informed of the importance of frequent follow up visits to maximize her success with intensive lifestyle modifications for her multiple health conditions.   Wilhemena Durie, am acting as transcriptionist for Lacy Duverney, PA-C I, Lacy Duverney [facility], have reviewed this note and agree with its content

## 2017-10-21 ENCOUNTER — Ambulatory Visit: Payer: 59 | Admitting: Podiatry

## 2017-10-25 ENCOUNTER — Ambulatory Visit (INDEPENDENT_AMBULATORY_CARE_PROVIDER_SITE_OTHER): Payer: 59 | Admitting: Physician Assistant

## 2017-10-25 VITALS — BP 137/85 | HR 69 | Temp 98.1°F | Ht 66.0 in | Wt 175.0 lb

## 2017-10-25 DIAGNOSIS — E669 Obesity, unspecified: Secondary | ICD-10-CM

## 2017-10-25 DIAGNOSIS — E559 Vitamin D deficiency, unspecified: Secondary | ICD-10-CM

## 2017-10-25 DIAGNOSIS — Z6830 Body mass index (BMI) 30.0-30.9, adult: Secondary | ICD-10-CM | POA: Diagnosis not present

## 2017-10-25 NOTE — Progress Notes (Signed)
Office: [phone]  /  Fax: [phone]   HPI:   Chief Complaint: OBESITY [name] is here to discuss her progress with her obesity treatment plan. She is on the Pescatarian eating plan and is following her eating plan approximately 95 % of the time. She states she is exercising 0 minutes 0 times per week. [name] continues to do well with weight loss. She is mindful of her eating, controls her portions, and is keeping up with her protein intake.  Her weight is 175 lb (79.4 kg) today and has had a weight loss of 1 pound over a period of 2 to 3 weeks since her last visit. She has lost 13 lbs since starting treatment with [name].  Vitamin D Deficiency [name] has a diagnosis of vitamin D deficiency. She is currently taking prescription Vit D and denies nausea, vomiting or muscle weakness.  ALLERGIES: No Known Allergies  MEDICATIONS: Current Outpatient Medications on File Prior to Visit  Medication Sig Dispense Refill  . ibuprofen (ADVIL,MOTRIN) 400 MG tablet Take 1 tablet (400 mg total) by mouth 3 (three) times daily. 30 tablet 0  . SUMAtriptan (IMITREX) 50 MG tablet Take 50 mg by mouth every 2 (two) hours as needed for migraine. May repeat in 2 hours if headache persists or recurs.    . Vitamin D, Ergocalciferol, (DRISDOL) 50000 units CAPS capsule Take 1 capsule (50,000 Units total) by mouth every 7 (seven) days. 4 capsule 0   No current facility-administered medications on file prior to visit.     PAST MEDICAL HISTORY: Past Medical History:  Diagnosis Date  . Anemia   . Asthma    as child  . Back pain   . GERD (gastroesophageal reflux disease)   . Headache   . Hypertension   . Joint pain   . Migraines   . Parathyroid abnormality ([place])   . Swelling    feet and legs  . Vitamin D deficiency     PAST SURGICAL HISTORY: Past Surgical History:  Procedure Laterality Date  . FOOT SURGERY Bilateral 2001  . PARATHYROIDECTOMY N/A 01/03/2016   Procedure: PARATHYROIDECTOMY;  Surgeon: Leta Baptist, MD;  Location: [facility];  Service: ENT;  Laterality: N/A;  . TUBAL LIGATION      SOCIAL HISTORY: Social History   Tobacco Use  . Smoking status: Never Smoker  . Smokeless tobacco: Never Used  Substance Use Topics  . Alcohol use: No  . Drug use: No    FAMILY HISTORY: Family History  Problem Relation Age of Onset  . Hypertension Father   . Cancer Maternal Grandmother   . Heart disease Paternal Grandmother   . Cancer Paternal Grandmother     ROS: Review of Systems  Constitutional: Positive for weight loss.  Gastrointestinal: Negative for nausea and vomiting.  Musculoskeletal:       Negative muscle weakness    PHYSICAL EXAM: Blood pressure 137/85, pulse 69, temperature 98.1 F (36.7 C), temperature source Oral, height 5\' 6"  (1.676 m), weight 175 lb (79.4 kg), SpO2 98 %. Body mass index is 28.25 kg/m. Physical Exam  Constitutional: She is oriented to person, place, and time. She appears well-developed and well-nourished.  Cardiovascular: Normal rate.  Pulmonary/Chest: Effort normal.  Musculoskeletal: Normal range of motion.  Neurological: She is oriented to person, place, and time.  Skin: Skin is warm and dry.  Psychiatric: She has a normal mood and affect. Her behavior is normal.  Vitals reviewed.   RECENT LABS AND TESTS: BMET  Component Value Date/Time   NA 143 09/01/2017 0759   K 4.6 09/01/2017 0759   CL 105 09/01/2017 0759   CO2 25 09/01/2017 0759   GLUCOSE 85 09/01/2017 0759   BUN 12 09/01/2017 0759   CREATININE 1.03 (H) 09/01/2017 0759   CALCIUM 8.9 09/01/2017 0759   CALCIUM 7.5 (L) 01/03/2016 1520   GFRNONAA 65 09/01/2017 0759   GFRAA 75 09/01/2017 0759   Lab Results  Component Value Date   HGBA1C 5.5 09/01/2017   HGBA1C 5.6 11/10/2016   HGBA1C 5.5 08/13/2016   Lab Results  Component Value Date   INSULIN 3.7 09/01/2017   INSULIN 5.6 11/10/2016   INSULIN 7.9 08/13/2016   CBC    Component Value Date/Time   WBC 7.2  08/13/2016 0928   RBC 4.45 08/13/2016 0928   HGB 12.4 08/13/2016 0928   HCT 40.8 08/13/2016 0928   MCV 92 08/13/2016 0928   MCH 27.9 08/13/2016 0928   MCHC 30.4 (L) 08/13/2016 0928   RDW 13.3 08/13/2016 0928   LYMPHSABS 2.4 08/13/2016 0928   EOSABS 0.1 08/13/2016 0928   BASOSABS 0.0 08/13/2016 0928   Iron/TIBC/Ferritin/ %Sat No results found for: IRON, TIBC, FERRITIN, IRONPCTSAT Lipid Panel     Component Value Date/Time   CHOL 195 09/01/2017 0759   TRIG 70 09/01/2017 0759   HDL 60 09/01/2017 0759   LDLCALC 121 (H) 09/01/2017 0759   Hepatic Function Panel     Component Value Date/Time   PROT 7.1 09/01/2017 0759   ALBUMIN 4.4 09/01/2017 0759   AST 19 09/01/2017 0759   ALT 20 09/01/2017 0759   ALKPHOS 61 09/01/2017 0759   BILITOT <0.2 09/01/2017 0759      Component Value Date/Time   TSH 2.080 08/13/2016 0928  Results for [name], [name] (MRN [id number]) as of 10/25/2017 11:23  Ref. Range 09/01/2017 07:59  Vitamin D, 25-Hydroxy Latest Ref Range: 30.0 - 100.0 ng/mL 48.6    ASSESSMENT AND PLAN: Vitamin D deficiency  Class 1 obesity with serious comorbidity and body mass index (BMI) of 30.0 to 30.9 in adult, unspecified obesity type - BMI greater than 30 at start of program  PLAN:  Vitamin D Deficiency [name] was informed that low vitamin D levels contributes to fatigue and are associated with obesity, breast, and colon cancer. [name] agrees to continue taking prescription Vit D @50 ,000 IU every week and will follow up for routine testing of vitamin D, at least 2-3 times per year. She was informed of the risk of over-replacement of vitamin D and agrees to not increase her dose unless she discusses this with [name] first. [name] agrees to follow up with our clinic in 2 weeks.  We spent > than 50% of the 15 minute visit on the counseling as documented in the note.  Obesity [name] is currently in the action stage of change. As such, her goal is to continue with weight loss efforts She has  agreed to follow the Pescatarian eating plan [name] has been instructed to work up to a goal of 150 minutes of combined cardio and strengthening exercise per week for weight loss and overall health benefits. We discussed the following Behavioral Modification Strategies today: increasing lean protein intake and work on meal planning and easy cooking plans   Blinda has agreed to follow up with our clinic in 2 weeks. She was informed of the importance of frequent follow up visits to maximize her success with intensive lifestyle modifications for her multiple health conditions.  Wilhemena Durie, am acting as transcriptionist for Lacy Duverney, PA-C I, Lacy Duverney [facility], have reviewed this note and agree with its content

## 2017-11-08 DIAGNOSIS — E785 Hyperlipidemia, unspecified: Secondary | ICD-10-CM | POA: Diagnosis not present

## 2017-11-08 DIAGNOSIS — I10 Essential (primary) hypertension: Secondary | ICD-10-CM | POA: Diagnosis not present

## 2017-11-08 DIAGNOSIS — Z0000 Encounter for general adult medical examination without abnormal findings: Secondary | ICD-10-CM | POA: Diagnosis not present

## 2017-11-09 MED FILL — FLUTICASONE PROP 50 MCG SPR: 50 | 90 days supply | Qty: 48 | Fill #0

## 2017-11-09 MED FILL — PROAIR HFA 90 MCG INHALER: 108 (90 BAS | 50 days supply | Qty: 26 | Fill #0

## 2017-11-10 ENCOUNTER — Ambulatory Visit (INDEPENDENT_AMBULATORY_CARE_PROVIDER_SITE_OTHER): Payer: 59 | Admitting: Physician Assistant

## 2017-11-10 VITALS — BP 126/80 | HR 63 | Temp 98.3°F | Ht 66.0 in | Wt 176.0 lb

## 2017-11-10 DIAGNOSIS — E669 Obesity, unspecified: Secondary | ICD-10-CM | POA: Diagnosis not present

## 2017-11-10 DIAGNOSIS — Z111 Encounter for screening for respiratory tuberculosis: Secondary | ICD-10-CM | POA: Diagnosis not present

## 2017-11-10 DIAGNOSIS — Z6830 Body mass index (BMI) 30.0-30.9, adult: Secondary | ICD-10-CM

## 2017-11-10 DIAGNOSIS — E559 Vitamin D deficiency, unspecified: Secondary | ICD-10-CM

## 2017-11-10 NOTE — Progress Notes (Signed)
Office: [phone]  /  Fax: [phone]   HPI:   Chief Complaint: OBESITY [name] is here to discuss her progress with her obesity treatment plan. She is on the Pescatarian eating plan and is following her eating plan approximately 90 % of the time. She states she is exercising 0 minutes 0 times per week. [name] is retaining fluids. She states she has not been drinking as much water. Also, she has had increase in emotional eating.  Her weight is 176 lb (79.8 kg) today and has gained 1 pound since her last visit. She has lost 12 lbs since starting treatment with [name].  Vitamin D Deficiency [name] has a diagnosis of vitamin D deficiency. She is currently taking prescription Vit D and denies nausea, vomiting or muscle weakness.  ALLERGIES: No Known Allergies  MEDICATIONS: Current Outpatient Medications on File Prior to Visit  Medication Sig Dispense Refill  . ibuprofen (ADVIL,MOTRIN) 400 MG tablet Take 1 tablet (400 mg total) by mouth 3 (three) times daily. 30 tablet 0  . SUMAtriptan (IMITREX) 50 MG tablet Take 50 mg by mouth every 2 (two) hours as needed for migraine. May repeat in 2 hours if headache persists or recurs.    . Vitamin D, Ergocalciferol, (DRISDOL) 50000 units CAPS capsule Take 1 capsule (50,000 Units total) by mouth every 7 (seven) days. 4 capsule 0   No current facility-administered medications on file prior to visit.     PAST MEDICAL HISTORY: Past Medical History:  Diagnosis Date  . Anemia   . Asthma    as child  . Back pain   . GERD (gastroesophageal reflux disease)   . Headache   . Hypertension   . Joint pain   . Migraines   . Parathyroid abnormality ([place])   . Swelling    feet and legs  . Vitamin D deficiency     PAST SURGICAL HISTORY: Past Surgical History:  Procedure Laterality Date  . FOOT SURGERY Bilateral 2001  . PARATHYROIDECTOMY N/A 01/03/2016   Procedure: PARATHYROIDECTOMY;  Surgeon: Leta Baptist, MD;  Location: [facility];  Service:  ENT;  Laterality: N/A;  . TUBAL LIGATION      SOCIAL HISTORY: Social History   Tobacco Use  . Smoking status: Never Smoker  . Smokeless tobacco: Never Used  Substance Use Topics  . Alcohol use: No  . Drug use: No    FAMILY HISTORY: Family History  Problem Relation Age of Onset  . Hypertension Father   . Cancer Maternal Grandmother   . Heart disease Paternal Grandmother   . Cancer Paternal Grandmother     ROS: Review of Systems  Constitutional: Negative for weight loss.  Gastrointestinal: Negative for nausea and vomiting.  Musculoskeletal:       Negative muscle weakness    PHYSICAL EXAM: Blood pressure 126/80, pulse 63, temperature 98.3 F (36.8 C), temperature source Oral, height 5\' 6"  (1.676 m), weight 176 lb (79.8 kg), SpO2 99 %. Body mass index is 28.41 kg/m. Physical Exam  Constitutional: She is oriented to person, place, and time. She appears well-developed and well-nourished.  Cardiovascular: Normal rate.  Pulmonary/Chest: Effort normal.  Musculoskeletal: Normal range of motion.  Neurological: She is oriented to person, place, and time.  Skin: Skin is warm and dry.  Psychiatric: She has a normal mood and affect. Her behavior is normal.  Vitals reviewed.   RECENT LABS AND TESTS: BMET    Component Value Date/Time   NA 143 09/01/2017 0759   K 4.6  09/01/2017 0759   CL 105 09/01/2017 0759   CO2 25 09/01/2017 0759   GLUCOSE 85 09/01/2017 0759   BUN 12 09/01/2017 0759   CREATININE 1.03 (H) 09/01/2017 0759   CALCIUM 8.9 09/01/2017 0759   CALCIUM 7.5 (L) 01/03/2016 1520   GFRNONAA 65 09/01/2017 0759   GFRAA 75 09/01/2017 0759   Lab Results  Component Value Date   HGBA1C 5.5 09/01/2017   HGBA1C 5.6 11/10/2016   HGBA1C 5.5 08/13/2016   Lab Results  Component Value Date   INSULIN 3.7 09/01/2017   INSULIN 5.6 11/10/2016   INSULIN 7.9 08/13/2016   CBC    Component Value Date/Time   WBC 7.2 08/13/2016 0928   RBC 4.45 08/13/2016 0928   HGB 12.4  08/13/2016 0928   HCT 40.8 08/13/2016 0928   MCV 92 08/13/2016 0928   MCH 27.9 08/13/2016 0928   MCHC 30.4 (L) 08/13/2016 0928   RDW 13.3 08/13/2016 0928   LYMPHSABS 2.4 08/13/2016 0928   EOSABS 0.1 08/13/2016 0928   BASOSABS 0.0 08/13/2016 0928   Iron/TIBC/Ferritin/ %Sat No results found for: IRON, TIBC, FERRITIN, IRONPCTSAT Lipid Panel     Component Value Date/Time   CHOL 195 09/01/2017 0759   TRIG 70 09/01/2017 0759   HDL 60 09/01/2017 0759   LDLCALC 121 (H) 09/01/2017 0759   Hepatic Function Panel     Component Value Date/Time   PROT 7.1 09/01/2017 0759   ALBUMIN 4.4 09/01/2017 0759   AST 19 09/01/2017 0759   ALT 20 09/01/2017 0759   ALKPHOS 61 09/01/2017 0759   BILITOT <0.2 09/01/2017 0759      Component Value Date/Time   TSH 2.080 08/13/2016 0928  Results for Sporrer, Dabney R (MRN [id number]) as of 11/10/2017 15:27  Ref. Range 09/01/2017 07:59  Vitamin D, 25-Hydroxy Latest Ref Range: 30.0 - 100.0 ng/mL 48.6    ASSESSMENT AND PLAN: Vitamin D deficiency  Class 1 obesity with serious comorbidity and body mass index (BMI) of 30.0 to 30.9 in adult, unspecified obesity type - Starting BMI greater then 30  PLAN:  Vitamin D Deficiency [name] was informed that low vitamin D levels contributes to fatigue and are associated with obesity, breast, and colon cancer. [name] agrees to continue taking prescription Vit D @50 ,000 IU every week and will follow up for routine testing of vitamin D, at least 2-3 times per year. She was informed of the risk of over-replacement of vitamin D and agrees to not increase her dose unless she discusses this with [name] first. [name] agrees to follow up with our clinic in 2 weeks.  We spent > than 50% of the 15 minute visit on the counseling as documented in the note.  Obesity [name] is currently in the action stage of change. As such, her goal is to continue with weight loss efforts She has agreed to follow the Pescatarian eating plan [name] has been  instructed to work up to a goal of 150 minutes of combined cardio and strengthening exercise per week for weight loss and overall health benefits. We discussed the following Behavioral Modification Strategies today: emotional eating strategies and increase H20 intake   [name] has agreed to follow up with our clinic in 2 weeks. She was informed of the importance of frequent follow up visits to maximize her success with intensive lifestyle modifications for her multiple health conditions.   [name], am acting as transcriptionist for Anna Duverney, PA-C I, [name] [facility], have reviewed this note and agree with  its content

## 2017-11-24 MED FILL — SUMAtriptan SUCCINATE 50 MG: 50 | 90 days supply | Qty: 27 | Fill #1

## 2017-11-30 ENCOUNTER — Ambulatory Visit (INDEPENDENT_AMBULATORY_CARE_PROVIDER_SITE_OTHER): Payer: 59 | Admitting: Podiatry

## 2017-11-30 DIAGNOSIS — M779 Enthesopathy, unspecified: Secondary | ICD-10-CM

## 2017-11-30 DIAGNOSIS — M2550 Pain in unspecified joint: Secondary | ICD-10-CM | POA: Diagnosis not present

## 2017-12-01 ENCOUNTER — Ambulatory Visit (INDEPENDENT_AMBULATORY_CARE_PROVIDER_SITE_OTHER): Payer: 59 | Admitting: Physician Assistant

## 2017-12-01 VITALS — BP 132/88 | HR 79 | Temp 98.1°F | Ht 66.0 in | Wt 179.0 lb

## 2017-12-01 DIAGNOSIS — Z6830 Body mass index (BMI) 30.0-30.9, adult: Secondary | ICD-10-CM | POA: Diagnosis not present

## 2017-12-01 DIAGNOSIS — E669 Obesity, unspecified: Secondary | ICD-10-CM | POA: Diagnosis not present

## 2017-12-01 DIAGNOSIS — E66811 Obesity, class 1: Secondary | ICD-10-CM

## 2017-12-01 DIAGNOSIS — E559 Vitamin D deficiency, unspecified: Secondary | ICD-10-CM | POA: Diagnosis not present

## 2017-12-01 MED ORDER — VITAMIN D (ERGOCALCIFEROL) 1.25 MG (50000 UNIT) PO CAPS: 50000.0000 [IU] | ORAL_CAPSULE | ORAL | 0 refills | Status: DC

## 2017-12-01 NOTE — Progress Notes (Signed)
Office: [phone]  /  Fax: [phone]   HPI:   Chief Complaint: OBESITY [name] is here to discuss her progress with her obesity treatment plan. She is on the Pescatarian eating plan and is following her eating plan approximately 90 % of the time. She states she is walking for 30 minutes 1 times per week. [name] had increased emotional eating and snacked more. She is motivated to get back on track and continue with weight loss.  Her weight is 179 lb (81.2 kg) today and has gained 3 pounds since her last visit. She has lost 9 lbs since starting treatment with [name].  Vitamin D Deficiency [name] has a diagnosis of vitamin D deficiency. She is currently taking prescription Vit D and denies nausea, vomiting or muscle weakness.  ALLERGIES: No Known Allergies  MEDICATIONS: Current Outpatient Medications on File Prior to Visit  Medication Sig Dispense Refill  . ibuprofen (ADVIL,MOTRIN) 400 MG tablet Take 1 tablet (400 mg total) by mouth 3 (three) times daily. 30 tablet 0  . SUMAtriptan (IMITREX) 50 MG tablet Take 50 mg by mouth every 2 (two) hours as needed for migraine. May repeat in 2 hours if headache persists or recurs.     No current facility-administered medications on file prior to visit.     PAST MEDICAL HISTORY: Past Medical History:  Diagnosis Date  . Anemia   . Asthma    as child  . Back pain   . GERD (gastroesophageal reflux disease)   . Headache   . Hypertension   . Joint pain   . Migraines   . Parathyroid abnormality ([place])   . Swelling    feet and legs  . Vitamin D deficiency     PAST SURGICAL HISTORY: Past Surgical History:  Procedure Laterality Date  . FOOT SURGERY Bilateral 2001  . PARATHYROIDECTOMY N/A 01/03/2016   Procedure: PARATHYROIDECTOMY;  Surgeon: Leta Baptist, MD;  Location: [facility];  Service: ENT;  Laterality: N/A;  . TUBAL LIGATION      SOCIAL HISTORY: Social History   Tobacco Use  . Smoking status: Never Smoker  . Smokeless  tobacco: Never Used  Substance Use Topics  . Alcohol use: No  . Drug use: No    FAMILY HISTORY: Family History  Problem Relation Age of Onset  . Hypertension Father   . Cancer Maternal Grandmother   . Heart disease Paternal Grandmother   . Cancer Paternal Grandmother     ROS: Review of Systems  Constitutional: Negative for weight loss.  Gastrointestinal: Negative for nausea and vomiting.  Musculoskeletal:       Negative muscle weakness    PHYSICAL EXAM: Blood pressure 132/88, pulse 79, temperature 98.1 F (36.7 C), temperature source Oral, height 5\' 6"  (1.676 m), weight 179 lb (81.2 kg), SpO2 97 %. Body mass index is 28.89 kg/m. Physical Exam  Constitutional: She is oriented to person, place, and time. She appears well-developed and well-nourished.  Cardiovascular: Normal rate.  Pulmonary/Chest: Effort normal.  Musculoskeletal: Normal range of motion.  Neurological: She is oriented to person, place, and time.  Skin: Skin is warm and dry.  Psychiatric: She has a normal mood and affect. Her behavior is normal.  Vitals reviewed.   RECENT LABS AND TESTS: BMET    Component Value Date/Time   NA 143 09/01/2017 0759   K 4.6 09/01/2017 0759   CL 105 09/01/2017 0759   CO2 25 09/01/2017 0759   GLUCOSE 85 09/01/2017 0759   BUN 12 09/01/2017  0759   CREATININE 1.03 (H) 09/01/2017 0759   CALCIUM 8.9 09/01/2017 0759   CALCIUM 7.5 (L) 01/03/2016 1520   GFRNONAA 65 09/01/2017 0759   GFRAA 75 09/01/2017 0759   Lab Results  Component Value Date   HGBA1C 5.5 09/01/2017   HGBA1C 5.6 11/10/2016   HGBA1C 5.5 08/13/2016   Lab Results  Component Value Date   INSULIN 3.7 09/01/2017   INSULIN 5.6 11/10/2016   INSULIN 7.9 08/13/2016   CBC    Component Value Date/Time   WBC 7.2 08/13/2016 0928   RBC 4.45 08/13/2016 0928   HGB 12.4 08/13/2016 0928   HCT 40.8 08/13/2016 0928   MCV 92 08/13/2016 0928   MCH 27.9 08/13/2016 0928   MCHC 30.4 (L) 08/13/2016 0928   RDW 13.3  08/13/2016 0928   LYMPHSABS 2.4 08/13/2016 0928   EOSABS 0.1 08/13/2016 0928   BASOSABS 0.0 08/13/2016 0928   Iron/TIBC/Ferritin/ %Sat No results found for: IRON, TIBC, FERRITIN, IRONPCTSAT Lipid Panel     Component Value Date/Time   CHOL 195 09/01/2017 0759   TRIG 70 09/01/2017 0759   HDL 60 09/01/2017 0759   LDLCALC 121 (H) 09/01/2017 0759   Hepatic Function Panel     Component Value Date/Time   PROT 7.1 09/01/2017 0759   ALBUMIN 4.4 09/01/2017 0759   AST 19 09/01/2017 0759   ALT 20 09/01/2017 0759   ALKPHOS 61 09/01/2017 0759   BILITOT <0.2 09/01/2017 0759      Component Value Date/Time   TSH 2.080 08/13/2016 0928  Results for [name], [name] (MRN [id number]) as of 12/02/2017 07:08  Ref. Range 09/01/2017 07:59  Vitamin D, 25-Hydroxy Latest Ref Range: 30.0 - 100.0 ng/mL 48.6    ASSESSMENT AND PLAN: Vitamin D deficiency - Plan: Vitamin D, Ergocalciferol, (DRISDOL) 50000 units CAPS capsule  Class 1 obesity with serious comorbidity and body mass index (BMI) of 30.0 to 30.9 in adult, unspecified obesity type - Starting BMI greater then 30  PLAN:  Vitamin D Deficiency [name] was informed that low vitamin D levels contributes to fatigue and are associated with obesity, breast, and colon cancer. [name] to continue taking prescription Vit D @50 ,000 IU every week #4 and we will refill for 1 month. She will follow up for routine testing of vitamin D, at least 2-3 times per year. She was informed of the risk of over-replacement of vitamin D and Lane to not increase her dose unless she discusses this with [name] first. [name] Lane to follow up with our clinic in 2 weeks.  We spent > than 50% of the 15 minute visit on the counseling as documented in the note.  Obesity [name] is currently in the action stage of change. As such, her goal is to continue with weight loss efforts She has agreed to keep a food journal with 1200 calories and 85 grams of protein daily [name] has been instructed  to work up to a goal of 150 minutes of combined cardio and strengthening exercise per week for weight loss and overall health benefits. We discussed the following Behavioral Modification Strategies today: increasing lean protein intake and keep a strict food journal   [name] has agreed to follow up with our clinic in 2 weeks. She was informed of the importance of frequent follow up visits to maximize her success with intensive lifestyle modifications for her multiple health conditions.   [name], am acting as transcriptionist for Lacy Duverney, PA-C I, Lacy Duverney [facility], have reviewed this  note and agree with its content

## 2017-12-01 NOTE — Progress Notes (Signed)
Subjective: [name] presents to the office today concerns of acute right foot pain that suddenly happened while at work today.  He points to the submetatarsal 4 area where she gets the majority of symptoms.  No recent injury or falls.  She states that hurts with pressure of the area.  She did recently get new inserts in shoes which may been aggravating his symptoms.  No numbness or tingling. Denies any systemic complaints such as fevers, chills, nausea, vomiting. No acute changes since last appointment, and no other complaints at this time.   Objective: AAO x3, NAD DP/PT pulses palpable bilaterally, CRT less than 3 seconds On the right foot there is tenderness on the plantar aspect the right foot along the submetatarsal 4 area.  There is no pain to the dorsal aspect of metatarsal.  There is minimal edema to the area but there is no erythema or increase in warmth.  No palpable neuroma is identified.  No other areas of tenderness identified at this time. No open lesions or pre-ulcerative lesions.  No pain with calf compression, swelling, warmth, erythema  Assessment: Capsulitis right foot, metatarsalgia  Plan: -All treatment options discussed with the patient including all alternatives, risks, complications.  -Offloading pads were dispensed.  She has meloxicam at home going to start with this.  Also ice to the area.  We discussed x-ray but wishes to hold off on them at this time.  She also reports that she is having multiple joint pains in the mornings when she gets up and she is been having some tendon problems previously with some Achilles problems which are resolved currently.  Should to get some blood work to check for arthritis. -Patient encouraged to call the office with any questions, concerns, change in symptoms.   Trula Slade DPM

## 2017-12-08 DIAGNOSIS — M2550 Pain in unspecified joint: Secondary | ICD-10-CM | POA: Diagnosis not present

## 2017-12-10 LAB — ANA: Anti Nuclear Antibody(ANA): NEGATIVE

## 2017-12-10 LAB — RHEUMATOID FACTOR

## 2017-12-10 LAB — CYCLIC CITRUL PEPTIDE ANTIBODY, IGG: Cyclic Citrullin Peptide Ab: <16 UNITS

## 2017-12-10 LAB — SEDIMENTATION RATE: SED RATE: 9 mm/h (ref 0–20)

## 2017-12-10 LAB — C-REACTIVE PROTEIN: CRP: 1 mg/L (ref <8.0)

## 2017-12-21 ENCOUNTER — Ambulatory Visit (INDEPENDENT_AMBULATORY_CARE_PROVIDER_SITE_OTHER): Payer: 59 | Admitting: Physician Assistant

## 2017-12-21 VITALS — BP 127/81 | HR 73 | Temp 98.1°F | Ht 66.0 in | Wt 178.0 lb

## 2017-12-21 DIAGNOSIS — Z6830 Body mass index (BMI) 30.0-30.9, adult: Secondary | ICD-10-CM | POA: Diagnosis not present

## 2017-12-21 DIAGNOSIS — E559 Vitamin D deficiency, unspecified: Secondary | ICD-10-CM | POA: Diagnosis not present

## 2017-12-21 DIAGNOSIS — E669 Obesity, unspecified: Secondary | ICD-10-CM | POA: Diagnosis not present

## 2017-12-21 MED ORDER — VITAMIN D (ERGOCALCIFEROL) 1.25 MG (50000 UNIT) PO CAPS: 50000.0000 [IU] | ORAL_CAPSULE | ORAL | 0 refills | Status: DC

## 2017-12-22 NOTE — Progress Notes (Signed)
Office: [phone]  /  Fax: [phone]   HPI:   Chief Complaint: OBESITY [name] is here to discuss her progress with her obesity treatment plan. She is on the keep a food journal with 1200 calories and 85 grams of protein daily and is following her eating plan approximately 95 % of the time. She states she is walking for 45 minutes 7 times per week. [name] continues to do well with weight loss. She keeps a strict food journal and states her hunger is well controlled. Her weight is 178 lb (80.7 kg) today and has had a weight loss of 1 pound over a period of 3 weeks since her last visit. She has lost 10 lbs since starting treatment with [name].  Vitamin D deficiency [name] has a diagnosis of vitamin D deficiency. She is currently taking vit D and denies nausea, vomiting or muscle weakness.  ALLERGIES: No Known Allergies  MEDICATIONS: Current Outpatient Medications on File Prior to Visit  Medication Sig Dispense Refill  . ibuprofen (ADVIL,MOTRIN) 400 MG tablet Take 1 tablet (400 mg total) by mouth 3 (three) times daily. 30 tablet 0  . SUMAtriptan (IMITREX) 50 MG tablet Take 50 mg by mouth every 2 (two) hours as needed for migraine. May repeat in 2 hours if headache persists or recurs.     No current facility-administered medications on file prior to visit.     PAST MEDICAL HISTORY: Past Medical History:  Diagnosis Date  . Anemia   . Asthma    as child  . Back pain   . GERD (gastroesophageal reflux disease)   . Headache   . Hypertension   . Joint pain   . Migraines   . Parathyroid abnormality ([place])   . Swelling    feet and legs  . Vitamin D deficiency     PAST SURGICAL HISTORY: Past Surgical History:  Procedure Laterality Date  . FOOT SURGERY Bilateral 2001  . PARATHYROIDECTOMY N/A 01/03/2016   Procedure: PARATHYROIDECTOMY;  Surgeon: Leta Baptist, MD;  Location: [place];  Service: ENT;  Laterality: N/A;  . TUBAL LIGATION      SOCIAL HISTORY: Social History     Tobacco Use  . Smoking status: Never Smoker  . Smokeless tobacco: Never Used  Substance Use Topics  . Alcohol use: No  . Drug use: No    FAMILY HISTORY: Family History  Problem Relation Age of Onset  . Hypertension Father   . Cancer Maternal Grandmother   . Heart disease Paternal Grandmother   . Cancer Paternal Grandmother     ROS: Review of Systems  Constitutional: Positive for weight loss.  Gastrointestinal: Negative for nausea and vomiting.  Musculoskeletal:       Negative for muscle weakness    PHYSICAL EXAM: Blood pressure 127/81, pulse 73, temperature 98.1 F (36.7 C), temperature source Oral, height 5\' 6"  (1.676 m), weight 178 lb (80.7 kg), SpO2 97 %. Body mass index is 28.73 kg/m. Physical Exam  Constitutional: She is oriented to person, place, and time. She appears well-developed and well-nourished.  Cardiovascular: Normal rate.  Pulmonary/Chest: Effort normal.  Musculoskeletal: Normal range of motion.  Neurological: She is oriented to person, place, and time.  Skin: Skin is warm and dry.  Psychiatric: She has a normal mood and affect. Her behavior is normal.  Vitals reviewed.   RECENT LABS AND TESTS: BMET    Component Value Date/Time   NA 143 09/01/2017 0759   K 4.6 09/01/2017 0759   CL  105 09/01/2017 0759   CO2 25 09/01/2017 0759   GLUCOSE 85 09/01/2017 0759   BUN 12 09/01/2017 0759   CREATININE 1.03 (H) 09/01/2017 0759   CALCIUM 8.9 09/01/2017 0759   CALCIUM 7.5 (L) 01/03/2016 1520   GFRNONAA 65 09/01/2017 0759   GFRAA 75 09/01/2017 0759   Lab Results  Component Value Date   HGBA1C 5.5 09/01/2017   HGBA1C 5.6 11/10/2016   HGBA1C 5.5 08/13/2016   Lab Results  Component Value Date   INSULIN 3.7 09/01/2017   INSULIN 5.6 11/10/2016   INSULIN 7.9 08/13/2016   CBC    Component Value Date/Time   WBC 7.2 08/13/2016 0928   RBC 4.45 08/13/2016 0928   HGB 12.4 08/13/2016 0928   HCT 40.8 08/13/2016 0928   MCV 92 08/13/2016 0928   MCH  27.9 08/13/2016 0928   MCHC 30.4 (L) 08/13/2016 0928   RDW 13.3 08/13/2016 0928   LYMPHSABS 2.4 08/13/2016 0928   EOSABS 0.1 08/13/2016 0928   BASOSABS 0.0 08/13/2016 0928   Iron/TIBC/Ferritin/ %Sat No results found for: IRON, TIBC, FERRITIN, IRONPCTSAT Lipid Panel     Component Value Date/Time   CHOL 195 09/01/2017 0759   TRIG 70 09/01/2017 0759   HDL 60 09/01/2017 0759   LDLCALC 121 (H) 09/01/2017 0759   Hepatic Function Panel     Component Value Date/Time   PROT 7.1 09/01/2017 0759   ALBUMIN 4.4 09/01/2017 0759   AST 19 09/01/2017 0759   ALT 20 09/01/2017 0759   ALKPHOS 61 09/01/2017 0759   BILITOT <0.2 09/01/2017 0759      Component Value Date/Time   TSH 2.080 08/13/2016 0928   Results for Lane, Anna R (MRN [id number]) as of 12/22/2017 08:12  Ref. Range 09/01/2017 07:59  Vitamin D, 25-Hydroxy Latest Ref Range: 30.0 - 100.0 ng/mL 48.6   ASSESSMENT AND PLAN: Vitamin D deficiency - Plan: Vitamin D, Ergocalciferol, (DRISDOL) 50000 units CAPS capsule  Class 1 obesity with serious comorbidity and body mass index (BMI) of 30.0 to 30.9 in adult, unspecified obesity type - Starting BMI greater then 30  PLAN:  Vitamin D Deficiency [name] was informed that low vitamin D levels contributes to fatigue and are associated with obesity, breast, and colon cancer. She agrees to continue to take prescription Vit D @50 ,000 IU every week #4 with no refills and will follow up for routine testing of vitamin D, at least 2-3 times per year. She was informed of the risk of over-replacement of vitamin D and agrees to not increase her dose unless she discusses this with [name] first. [name] agrees to follow up as directed. We spent > than 50% of the 15 minute visit on the counseling as documented in the note.  Obesity [name] is currently in the action stage of change. As such, her goal is to continue with weight loss efforts She has agreed to keep a food journal with 1200 calories and 85 grams of protein  daily [name] has been instructed to work up to a goal of 150 minutes of combined cardio and strengthening exercise per week for weight loss and overall health benefits. We discussed the following Behavioral Modification Strategies today: increasing lean protein intake and keep a strict food journal  Isolde has agreed to follow up with our clinic in 2 weeks. She was informed of the importance of frequent follow up visits to maximize her success with intensive lifestyle modifications for her multiple health conditions.  Corey Skains, am acting as transcriptionist for  Lacy Duverney, PA-C I, Lacy Duverney [facility] [place], have reviewed this note and agree with its content

## 2018-01-05 ENCOUNTER — Encounter (INDEPENDENT_AMBULATORY_CARE_PROVIDER_SITE_OTHER): Payer: Self-pay

## 2018-01-05 ENCOUNTER — Ambulatory Visit (INDEPENDENT_AMBULATORY_CARE_PROVIDER_SITE_OTHER): Payer: 59 | Admitting: Physician Assistant

## 2018-01-17 MED FILL — VIT D2 1.25 MG (50,000 UNIT: 1.25 MG | 28 days supply | Qty: 4 | Fill #0

## 2018-01-19 ENCOUNTER — Ambulatory Visit (INDEPENDENT_AMBULATORY_CARE_PROVIDER_SITE_OTHER): Payer: 59 | Admitting: Physician Assistant

## 2018-01-19 VITALS — BP 124/85 | HR 65 | Temp 98.2°F | Ht 66.0 in | Wt 180.0 lb

## 2018-01-19 DIAGNOSIS — E559 Vitamin D deficiency, unspecified: Secondary | ICD-10-CM | POA: Diagnosis not present

## 2018-01-19 DIAGNOSIS — Z6830 Body mass index (BMI) 30.0-30.9, adult: Secondary | ICD-10-CM

## 2018-01-19 DIAGNOSIS — E669 Obesity, unspecified: Secondary | ICD-10-CM | POA: Diagnosis not present

## 2018-01-20 NOTE — Progress Notes (Signed)
Office: [phone]  /  Fax: [phone]   HPI:   Chief Complaint: OBESITY [name] is here to discuss her progress with her obesity treatment plan. She is on the keep a food journal with 1200 calories and 85 grams of protein daily and is following her eating plan approximately 85 % of the time. She states she is exercising 45 minutes 7 times per week. [name] has increased her walking. She states she has skipped dinner on occasions. Her weight is 180 lb (81.6 kg) today and has had a weight gain of 2 pounds over a period of 4 weeks since her last visit. She has lost 8 lbs since starting treatment with [name].  Vitamin D deficiency [name] has a diagnosis of vitamin D deficiency. She is not currently taking vit D and denies nausea, vomiting or muscle weakness.  ALLERGIES: No Known Allergies  MEDICATIONS: Current Outpatient Medications on File Prior to Visit  Medication Sig Dispense Refill  . ibuprofen (ADVIL,MOTRIN) 400 MG tablet Take 1 tablet (400 mg total) by mouth 3 (three) times daily. 30 tablet 0  . SUMAtriptan (IMITREX) 50 MG tablet Take 50 mg by mouth every 2 (two) hours as needed for migraine. May repeat in 2 hours if headache persists or recurs.     No current facility-administered medications on file prior to visit.     PAST MEDICAL HISTORY: Past Medical History:  Diagnosis Date  . Anemia   . Asthma    as child  . Back pain   . GERD (gastroesophageal reflux disease)   . Headache   . Hypertension   . Joint pain   . Migraines   . Parathyroid abnormality ([place])   . Swelling    feet and legs  . Vitamin D deficiency     PAST SURGICAL HISTORY: Past Surgical History:  Procedure Laterality Date  . FOOT SURGERY Bilateral 2001  . PARATHYROIDECTOMY N/A 01/03/2016   Procedure: PARATHYROIDECTOMY;  Surgeon: Leta Baptist, MD;  Location: [facility];  Service: ENT;  Laterality: N/A;  . TUBAL LIGATION      SOCIAL HISTORY: Social History   Tobacco Use  . Smoking status:  Never Smoker  . Smokeless tobacco: Never Used  Substance Use Topics  . Alcohol use: No  . Drug use: No    FAMILY HISTORY: Family History  Problem Relation Age of Onset  . Hypertension Father   . Cancer Maternal Grandmother   . Heart disease Paternal Grandmother   . Cancer Paternal Grandmother     ROS: Review of Systems  Constitutional: Negative for weight loss.  Gastrointestinal: Negative for nausea and vomiting.  Musculoskeletal:       Negative for muscle weakness    PHYSICAL EXAM: Blood pressure 124/85, pulse 65, temperature 98.2 F (36.8 C), temperature source Oral, height 5\' 6"  (1.676 m), weight 180 lb (81.6 kg), SpO2 98 %. Body mass index is 29.05 kg/m. Physical Exam  Constitutional: She is oriented to person, place, and time. She appears well-developed and well-nourished.  Cardiovascular: Normal rate.  Pulmonary/Chest: Effort normal.  Musculoskeletal: Normal range of motion.  Neurological: She is oriented to person, place, and time.  Skin: Skin is warm and dry.  Psychiatric: She has a normal mood and affect. Her behavior is normal.  Vitals reviewed.   RECENT LABS AND TESTS: BMET    Component Value Date/Time   NA 143 09/01/2017 0759   K 4.6 09/01/2017 0759   CL 105 09/01/2017 0759   CO2 25 09/01/2017 0759  GLUCOSE 85 09/01/2017 0759   BUN 12 09/01/2017 0759   CREATININE 1.03 (H) 09/01/2017 0759   CALCIUM 8.9 09/01/2017 0759   CALCIUM 7.5 (L) 01/03/2016 1520   GFRNONAA 65 09/01/2017 0759   GFRAA 75 09/01/2017 0759   Lab Results  Component Value Date   HGBA1C 5.5 09/01/2017   HGBA1C 5.6 11/10/2016   HGBA1C 5.5 08/13/2016   Lab Results  Component Value Date   INSULIN 3.7 09/01/2017   INSULIN 5.6 11/10/2016   INSULIN 7.9 08/13/2016   CBC    Component Value Date/Time   WBC 7.2 08/13/2016 0928   RBC 4.45 08/13/2016 0928   HGB 12.4 08/13/2016 0928   HCT 40.8 08/13/2016 0928   MCV 92 08/13/2016 0928   MCH 27.9 08/13/2016 0928   MCHC 30.4 (L)  08/13/2016 0928   RDW 13.3 08/13/2016 0928   LYMPHSABS 2.4 08/13/2016 0928   EOSABS 0.1 08/13/2016 0928   BASOSABS 0.0 08/13/2016 0928   Iron/TIBC/Ferritin/ %Sat No results found for: IRON, TIBC, FERRITIN, IRONPCTSAT Lipid Panel     Component Value Date/Time   CHOL 195 09/01/2017 0759   TRIG 70 09/01/2017 0759   HDL 60 09/01/2017 0759   LDLCALC 121 (H) 09/01/2017 0759   Hepatic Function Panel     Component Value Date/Time   PROT 7.1 09/01/2017 0759   ALBUMIN 4.4 09/01/2017 0759   AST 19 09/01/2017 0759   ALT 20 09/01/2017 0759   ALKPHOS 61 09/01/2017 0759   BILITOT <0.2 09/01/2017 0759      Component Value Date/Time   TSH 2.080 08/13/2016 0928   Results for Sutphin, Zoa R (MRN [id number]) as of 01/20/2018 08:03  Ref. Range 09/01/2017 07:59  Vitamin D, 25-Hydroxy Latest Ref Range: 30.0 - 100.0 ng/mL 48.6  ASSESSMENT AND PLAN: Vitamin D deficiency  Class 1 obesity with serious comorbidity and body mass index (BMI) of 30.0 to 30.9 in adult, unspecified obesity type - Beginning BMI >30  PLAN:  Vitamin D Deficiency [name] was informed that low vitamin D levels contributes to fatigue and are associated with obesity, breast, and colon cancer. She agrees to discontinue to take prescription Vit D @50 ,000 IU every week and take daily vitamin supplement. We will check labs and she will follow up for routine testing of vitamin D, at least 2-3 times per year. She was informed of the risk of over-replacement of vitamin D and agrees to not increase her dose unless she discusses this with [name] first. [name] agrees to follow up as directed.  We spent > than 50% of the 15 minute visit on the counseling as documented in the note.   Obesity [name] is currently in the action stage of change. As such, her goal is to continue with weight loss efforts She has agreed to keep a food journal with 1200 calories and 85 grams of protein daily [name] has been instructed to work up to a goal of 150 minutes of  combined cardio and strengthening exercise per week for weight loss and overall health benefits. We discussed the following Behavioral Modification Strategies today: increasing lean protein intake and no skipping meals  [name] has agreed to follow up with our clinic in 2 to 3 weeks. She was informed of the importance of frequent follow up visits to maximize her success with intensive lifestyle modifications for her multiple health conditions.   Corey Skains, am acting as transcriptionist for [employer], PA-C I, Lacy Duverney [facility], have reviewed this note and agree with its  content

## 2018-02-07 ENCOUNTER — Ambulatory Visit (INDEPENDENT_AMBULATORY_CARE_PROVIDER_SITE_OTHER): Payer: 59 | Admitting: Family Medicine

## 2018-02-08 ENCOUNTER — Ambulatory Visit (INDEPENDENT_AMBULATORY_CARE_PROVIDER_SITE_OTHER): Payer: 59 | Admitting: Family Medicine

## 2018-02-08 VITALS — BP 143/92 | HR 76 | Temp 98.1°F | Ht 66.0 in | Wt 180.0 lb

## 2018-02-08 DIAGNOSIS — Z6830 Body mass index (BMI) 30.0-30.9, adult: Secondary | ICD-10-CM | POA: Diagnosis not present

## 2018-02-08 DIAGNOSIS — Z9189 Other specified personal risk factors, not elsewhere classified: Secondary | ICD-10-CM

## 2018-02-08 DIAGNOSIS — E559 Vitamin D deficiency, unspecified: Secondary | ICD-10-CM

## 2018-02-08 DIAGNOSIS — E669 Obesity, unspecified: Secondary | ICD-10-CM

## 2018-02-09 MED ORDER — VITAMIN D (ERGOCALCIFEROL) 1.25 MG (50000 UNIT) PO CAPS: 50000.0000 [IU] | ORAL_CAPSULE | ORAL | 0 refills | Status: DC

## 2018-02-09 MED FILL — VIT D2 1.25 MG (50,000 UNIT: 1.25 MG | 28 days supply | Qty: 4 | Fill #0

## 2018-02-09 NOTE — Progress Notes (Signed)
Office: [phone]  /  Fax: [phone]   HPI:   Chief Complaint: OBESITY [name] is here to discuss her progress with her obesity treatment plan. She is on the keep a food journal with 1200 calories and 85 grams of protein daily and is following her eating plan approximately 70 % of the time. She states she is walking for 60-65 minutes 5 times per week. [name] has done well maintaining weight but has struggled more with increased stress eating and meal prep with new job.  Her weight is 180 lb (81.6 kg) today and has not lost weight since her last visit. She has lost 8 lbs since starting treatment with [name].  Vitamin D Deficiency [name] has a diagnosis of vitamin D deficiency. She is stable on prescription Vit D, not yet at goal. She denies nausea, vomiting or muscle weakness.  At risk for osteopenia and osteoporosis [name] is at higher risk of osteopenia and osteoporosis due to vitamin D deficiency.   ALLERGIES: No Known Allergies  MEDICATIONS: Current Outpatient Medications on File Prior to Visit  Medication Sig Dispense Refill  . ibuprofen (ADVIL,MOTRIN) 400 MG tablet Take 1 tablet (400 mg total) by mouth 3 (three) times daily. 30 tablet 0  . SUMAtriptan (IMITREX) 50 MG tablet Take 50 mg by mouth every 2 (two) hours as needed for migraine. May repeat in 2 hours if headache persists or recurs.    . vitamin A 10000 UNIT capsule Take 50,000 Units by mouth once a week.     No current facility-administered medications on file prior to visit.     PAST MEDICAL HISTORY: Past Medical History:  Diagnosis Date  . Anemia   . Asthma    as child  . Back pain   . GERD (gastroesophageal reflux disease)   . Headache   . Hypertension   . Joint pain   . Migraines   . Parathyroid abnormality ([name])   . Swelling    feet and legs  . Vitamin D deficiency     PAST SURGICAL HISTORY: Past Surgical History:  Procedure Laterality Date  . FOOT SURGERY Bilateral 2001  . PARATHYROIDECTOMY N/A  01/03/2016   Procedure: PARATHYROIDECTOMY;  Surgeon: Leta Baptist, MD;  Location: [facility];  Service: ENT;  Laterality: N/A;  . TUBAL LIGATION      SOCIAL HISTORY: Social History   Tobacco Use  . Smoking status: Never Smoker  . Smokeless tobacco: Never Used  Substance Use Topics  . Alcohol use: No  . Drug use: No    FAMILY HISTORY: Family History  Problem Relation Age of Onset  . Hypertension Father   . Cancer Maternal Grandmother   . Heart disease Paternal Grandmother   . Cancer Paternal Grandmother     ROS: Review of Systems  Constitutional: Negative for weight loss.  Gastrointestinal: Negative for nausea and vomiting.  Musculoskeletal:       Negative muscle weakness    PHYSICAL EXAM: Blood pressure (!) 143/92, pulse 76, temperature 98.1 F (36.7 C), temperature source Oral, height 5\' 6"  (1.676 m), weight 180 lb (81.6 kg), SpO2 97 %. Body mass index is 29.05 kg/m. Physical Exam  Constitutional: She is oriented to person, place, and time. She appears well-developed and well-nourished.  Cardiovascular: Normal rate.  Pulmonary/Chest: Effort normal.  Musculoskeletal: Normal range of motion.  Neurological: She is oriented to person, place, and time.  Skin: Skin is warm and dry.  Psychiatric: She has a normal mood and affect. Her behavior  is normal.  Vitals reviewed.   RECENT LABS AND TESTS: BMET    Component Value Date/Time   NA 143 09/01/2017 0759   K 4.6 09/01/2017 0759   CL 105 09/01/2017 0759   CO2 25 09/01/2017 0759   GLUCOSE 85 09/01/2017 0759   BUN 12 09/01/2017 0759   CREATININE 1.03 (H) 09/01/2017 0759   CALCIUM 8.9 09/01/2017 0759   CALCIUM 7.5 (L) 01/03/2016 1520   GFRNONAA 65 09/01/2017 0759   GFRAA 75 09/01/2017 0759   Lab Results  Component Value Date   HGBA1C 5.5 09/01/2017   HGBA1C 5.6 11/10/2016   HGBA1C 5.5 08/13/2016   Lab Results  Component Value Date   INSULIN 3.7 09/01/2017   INSULIN 5.6 11/10/2016   INSULIN 7.9  08/13/2016   CBC    Component Value Date/Time   WBC 7.2 08/13/2016 0928   RBC 4.45 08/13/2016 0928   HGB 12.4 08/13/2016 0928   HCT 40.8 08/13/2016 0928   MCV 92 08/13/2016 0928   MCH 27.9 08/13/2016 0928   MCHC 30.4 (L) 08/13/2016 0928   RDW 13.3 08/13/2016 0928   LYMPHSABS 2.4 08/13/2016 0928   EOSABS 0.1 08/13/2016 0928   BASOSABS 0.0 08/13/2016 0928   Iron/TIBC/Ferritin/ %Sat No results found for: IRON, TIBC, FERRITIN, IRONPCTSAT Lipid Panel     Component Value Date/Time   CHOL 195 09/01/2017 0759   TRIG 70 09/01/2017 0759   HDL 60 09/01/2017 0759   LDLCALC 121 (H) 09/01/2017 0759   Hepatic Function Panel     Component Value Date/Time   PROT 7.1 09/01/2017 0759   ALBUMIN 4.4 09/01/2017 0759   AST 19 09/01/2017 0759   ALT 20 09/01/2017 0759   ALKPHOS 61 09/01/2017 0759   BILITOT <0.2 09/01/2017 0759      Component Value Date/Time   TSH 2.080 08/13/2016 0928  Results for Marlatt, [name] (MRN [id number]) as of 02/09/2018 08:26  Ref. Range 09/01/2017 07:59  Vitamin D, 25-Hydroxy Latest Ref Range: 30.0 - 100.0 ng/mL 48.6    ASSESSMENT AND PLAN: Vitamin D deficiency - Plan: Vitamin D, Ergocalciferol, (DRISDOL) 50000 units CAPS capsule  At risk for osteoporosis  Class 1 obesity with serious comorbidity and body mass index (BMI) of 30.0 to 30.9 in adult, unspecified obesity type - beginning BMI >30  PLAN:  Vitamin D Deficiency [name] was informed that low vitamin D levels contributes to fatigue and are associated with obesity, breast, and colon cancer. [name] agrees to continue taking prescription Vit D @50 ,000 IU every week #4 and we will refill for 1 month. She will follow up for routine testing of vitamin D, at least 2-3 times per year. She was informed of the risk of over-replacement of vitamin D and agrees to not increase her dose unless she discusses this with [name] first. [name] agrees to follow up with our clinic in 2 to 3 weeks.  At risk for osteopenia and  osteoporosis [name] is at risk for osteopenia and osteoporsis due to her vitamin D deficiency. She was encouraged to take her vitamin D and follow her higher calcium diet and increase strengthening exercise to help strengthen her bones and decrease her risk of osteopenia and osteoporosis.  Obesity [name] is currently in the action stage of change. As such, her goal is to continue with weight loss efforts She has agreed to change to follow a lower carbohydrate, vegetable and lean protein rich diet plan [name] has been instructed to work up to a goal of 150 minutes  of combined cardio and strengthening exercise per week for weight loss and overall health benefits. We discussed the following Behavioral Modification Strategies today: increasing lean protein intake and decreasing simple carbohydrates    [name] has agreed to follow up with our clinic in 2 to 3 weeks. She was informed of the importance of frequent follow up visits to maximize her success with intensive lifestyle modifications for her multiple health conditions.   I, Trixie Dredge, am acting as transcriptionist for Dennard Nip, MD  I have reviewed the above documentation for accuracy and completeness, and I agree with the above. -Dennard Nip, MD

## 2018-02-15 MED FILL — SUMAtriptan SUCCINATE 50 MG: 50 | 90 days supply | Qty: 27 | Fill #0

## 2018-03-02 ENCOUNTER — Ambulatory Visit (INDEPENDENT_AMBULATORY_CARE_PROVIDER_SITE_OTHER): Payer: 59 | Admitting: Family Medicine

## 2018-03-02 VITALS — BP 120/82 | HR 65 | Temp 97.9°F | Ht 66.0 in | Wt 176.0 lb

## 2018-03-02 DIAGNOSIS — E669 Obesity, unspecified: Secondary | ICD-10-CM | POA: Diagnosis not present

## 2018-03-02 DIAGNOSIS — Z6830 Body mass index (BMI) 30.0-30.9, adult: Secondary | ICD-10-CM

## 2018-03-02 DIAGNOSIS — Z9189 Other specified personal risk factors, not elsewhere classified: Secondary | ICD-10-CM | POA: Diagnosis not present

## 2018-03-02 DIAGNOSIS — R5383 Other fatigue: Secondary | ICD-10-CM

## 2018-03-02 DIAGNOSIS — E559 Vitamin D deficiency, unspecified: Secondary | ICD-10-CM | POA: Diagnosis not present

## 2018-03-02 MED ORDER — VITAMIN D (ERGOCALCIFEROL) 1.25 MG (50000 UNIT) PO CAPS: 50000.0000 [IU] | ORAL_CAPSULE | ORAL | 0 refills | Status: DC

## 2018-03-02 NOTE — Progress Notes (Signed)
Office: [phone]  /  Fax: [phone]   HPI:   Chief Complaint: OBESITY [name] is here to discuss her progress with her obesity treatment plan. She is on the  follow a lower carbohydrate, vegetable and lean protein rich diet plan and is following her eating plan approximately 100 % of the time. She states she is exercising 0 minutes 0 times per week. [name] was changed to low carb plan at her last visit and is doing very well with this plan. Her hunger is controlled and she has increased vegetables and water intake.   Her weight is 176 lb (79.8 kg) today and has had a weight loss of 4 pounds over a period of 3 weeks since her last visit. She has lost 12 lbs since starting treatment with [name].  Vitamin D deficiency [name] has a diagnosis of vitamin D deficiency. She is currently taking vit D and denies nausea, vomiting or muscle weakness. She is due for labs today.    Ref. Range 09/01/2017 07:59  Vitamin D, 25-Hydroxy Latest Ref Range: 30.0 - 100.0 ng/mL 48.6   Fatigue Generalized [name] feels her energy is lower than it should be. This has worsened with weight gain, but she admits is has improved.   At risk for osteopenia and osteoporosis [name] is at higher risk of osteopenia and osteoporosis due to vitamin D deficiency.   ALLERGIES: No Known Allergies  MEDICATIONS: Current Outpatient Medications on File Prior to Visit  Medication Sig Dispense Refill  . ibuprofen (ADVIL,MOTRIN) 400 MG tablet Take 1 tablet (400 mg total) by mouth 3 (three) times daily. 30 tablet 0  . SUMAtriptan (IMITREX) 50 MG tablet Take 50 mg by mouth every 2 (two) hours as needed for migraine. May repeat in 2 hours if headache persists or recurs.    . vitamin A 10000 UNIT capsule Take 50,000 Units by mouth once a week.     No current facility-administered medications on file prior to visit.     PAST MEDICAL HISTORY: Past Medical History:  Diagnosis Date  . Anemia   . Asthma    as child  . Back pain   . GERD  (gastroesophageal reflux disease)   . Headache   . Hypertension   . Joint pain   . Migraines   . Parathyroid abnormality ([place])   . Swelling    feet and legs  . Vitamin D deficiency     PAST SURGICAL HISTORY: Past Surgical History:  Procedure Laterality Date  . FOOT SURGERY Bilateral 2001  . PARATHYROIDECTOMY N/A 01/03/2016   Procedure: PARATHYROIDECTOMY;  Surgeon: Leta Baptist, MD;  Location: [facility];  Service: ENT;  Laterality: N/A;  . TUBAL LIGATION      SOCIAL HISTORY: Social History   Tobacco Use  . Smoking status: Never Smoker  . Smokeless tobacco: Never Used  Substance Use Topics  . Alcohol use: No  . Drug use: No    FAMILY HISTORY: Family History  Problem Relation Age of Onset  . Hypertension Father   . Cancer Maternal Grandmother   . Heart disease Paternal Grandmother   . Cancer Paternal Grandmother     ROS: Review of Systems  Constitutional: Positive for malaise/fatigue and weight loss.  Gastrointestinal: Negative for nausea and vomiting.  Musculoskeletal:       Negative for muscle weakness    PHYSICAL EXAM: Blood pressure 120/82, pulse 65, temperature 97.9 F (36.6 C), temperature source Oral, height 5\' 6"  (1.676 m), weight 176 lb (79.8  kg), SpO2 97 %. Body mass index is 28.41 kg/m. Physical Exam  Constitutional: She is oriented to person, place, and time. She appears well-developed and well-nourished.  HENT:  Head: Normocephalic.  Eyes: Pupils are equal, round, and reactive to light. EOM are normal.  Neck: Normal range of motion.  Cardiovascular: Normal rate.  Pulmonary/Chest: Effort normal.  Musculoskeletal: Normal range of motion.  Neurological: She is alert and oriented to person, place, and time.  Skin: Skin is warm and dry.  Psychiatric: She has a normal mood and affect. Her behavior is normal.  Vitals reviewed.   RECENT LABS AND TESTS: BMET    Component Value Date/Time   NA 143 09/01/2017 0759   K 4.6 09/01/2017  0759   CL 105 09/01/2017 0759   CO2 25 09/01/2017 0759   GLUCOSE 85 09/01/2017 0759   BUN 12 09/01/2017 0759   CREATININE 1.03 (H) 09/01/2017 0759   CALCIUM 8.9 09/01/2017 0759   CALCIUM 7.5 (L) 01/03/2016 1520   GFRNONAA 65 09/01/2017 0759   GFRAA 75 09/01/2017 0759   Lab Results  Component Value Date   HGBA1C 5.5 09/01/2017   HGBA1C 5.6 11/10/2016   HGBA1C 5.5 08/13/2016   Lab Results  Component Value Date   INSULIN 3.7 09/01/2017   INSULIN 5.6 11/10/2016   INSULIN 7.9 08/13/2016   CBC    Component Value Date/Time   WBC 7.2 08/13/2016 0928   RBC 4.45 08/13/2016 0928   HGB 12.4 08/13/2016 0928   HCT 40.8 08/13/2016 0928   MCV 92 08/13/2016 0928   MCH 27.9 08/13/2016 0928   MCHC 30.4 (L) 08/13/2016 0928   RDW 13.3 08/13/2016 0928   LYMPHSABS 2.4 08/13/2016 0928   EOSABS 0.1 08/13/2016 0928   BASOSABS 0.0 08/13/2016 0928   Iron/TIBC/Ferritin/ %Sat No results found for: IRON, TIBC, FERRITIN, IRONPCTSAT Lipid Panel     Component Value Date/Time   CHOL 195 09/01/2017 0759   TRIG 70 09/01/2017 0759   HDL 60 09/01/2017 0759   LDLCALC 121 (H) 09/01/2017 0759   Hepatic Function Panel     Component Value Date/Time   PROT 7.1 09/01/2017 0759   ALBUMIN 4.4 09/01/2017 0759   AST 19 09/01/2017 0759   ALT 20 09/01/2017 0759   ALKPHOS 61 09/01/2017 0759   BILITOT <0.2 09/01/2017 0759      Component Value Date/Time   TSH 2.080 08/13/2016 0928     Ref. Range 09/01/2017 07:59  Vitamin D, 25-Hydroxy Latest Ref Range: 30.0 - 100.0 ng/mL 48.6   ASSESSMENT AND PLAN: Vitamin D deficiency - Plan: VITAMIN D 25 Hydroxy (Vit-D Deficiency, Fractures), Vitamin D, Ergocalciferol, (DRISDOL) 50000 units CAPS capsule  Other fatigue - Plan: Vitamin B12, CBC With Differential, Folate, Hemoglobin A1c, Insulin, random, Lipid Panel With LDL/HDL Ratio, T3, T4, free, TSH, Comprehensive metabolic panel  At risk for osteoporosis  Class 1 obesity with serious comorbidity and body mass  index (BMI) of 30.0 to 30.9 in adult, unspecified obesity type - Starting BMI greater then 30  PLAN: Vitamin D Deficiency [name] was informed that low vitamin D levels contributes to fatigue and are associated with obesity, breast, and colon cancer. She agrees to continue to take prescription. Refill for Vit D @50 ,000 IU every week #4 with no refills written today and will follow up for routine testing of vitamin D, at least 2-3 times per year. She was informed of the risk of over-replacement of vitamin D and agrees to not increase her dose unless she discusses  this with [name] first. She agrees to follow up in our clinic as directed.   Fatigue Generalized [name] was informed that her fatigue may be related to obesity, depression or many other causes. Labs will be ordered, and in the meanwhile [name] has agreed to work on diet, exercise and weight loss to help with fatigue. Proper sleep hygiene was discussed including the need for 7-8 hours of quality sleep each night. She agrees to follow up in our clinic as directed.   At risk for osteopenia and osteoporosis [name] is at risk for osteopenia and osteoporosis due to her vitamin D deficiency. She was encouraged to take her vitamin D and follow her higher calcium diet and increase strengthening exercise to help strengthen her bones and decrease her risk of osteopenia and osteoporosis.   Obesity  [name] is currently in the action stage of change. As such, her goal is to continue with weight loss efforts She has agreed to follow a lower carbohydrate, vegetable and lean protein rich diet plan [name] has been instructed to work up to a goal of 150 minutes of combined cardio and strengthening exercise per week for weight loss and overall health benefits. We discussed the following Behavioral Modification Strategies today: decreasing simple carbohydrates  and increasing vegetables and no skipping meals.   [name] has agreed to follow up with our clinic in 3 weeks. She was  informed of the importance of frequent follow up visits to maximize her success with intensive lifestyle modifications for her multiple health conditions.   OBESITY BEHAVIORAL INTERVENTION VISIT  Today's visit was # 30  Starting weight: 188 lb Starting date: 08/13/16 Today's weight : 176 lb Today's date: 03/02/2018 Total lbs lost to date: 12 lb    ASK: We discussed the diagnosis of obesity with [name] today and [name] agreed to give [name] permission to discuss obesity behavioral modification therapy today.  ASSESS: [name] has the diagnosis of obesity and her BMI today is 28.42 [name] is in the action stage of change   ADVISE: [name] was educated on the multiple health risks of obesity as well as the benefit of weight loss to improve her health. She was advised of the need for long term treatment and the importance of lifestyle modifications to improve her current health and to decrease her risk of future health problems.  AGREE: Multiple dietary modification options and treatment options were discussed and  [name] agreed to follow the recommendations documented in the above note.  ARRANGE: [name] was educated on the importance of frequent visits to treat obesity as outlined per CMS and USPSTF guidelines and agreed to schedule her next follow up appointment today.  Leary Roca, LPN , am acting as transcriptionist for Dennard Nip, MD  I have reviewed the above documentation for accuracy and completeness, and I agree with the above. -Dennard Nip, MD

## 2018-03-03 LAB — COMPREHENSIVE METABOLIC PANEL
ALK PHOS: 49 IU/L (ref 39–117)
ALT: 20 IU/L (ref 0–32)
AST: 23 IU/L (ref 0–40)
Albumin/Globulin Ratio: 1.7 (ref 1.2–2.2)
Albumin: 4.3 g/dL (ref 3.5–5.5)
BUN/Creatinine Ratio: 28 — ABNORMAL HIGH (ref 9–23)
BUN: 27 mg/dL — ABNORMAL HIGH (ref 6–24)
Bilirubin Total: 0.3 mg/dL (ref 0.0–1.2)
CHLORIDE: 103 mmol/L (ref 96–106)
CO2: 21 mmol/L (ref 20–29)
CREATININE: 0.95 mg/dL (ref 0.57–1.00)
Calcium: 9.2 mg/dL (ref 8.7–10.2)
GFR calc Af Amer: 82 mL/min/{1.73_m2} (ref >59)
GFR calc non Af Amer: 72 mL/min/{1.73_m2} (ref >59)
GLOBULIN, TOTAL: 2.6 g/dL (ref 1.5–4.5)
GLUCOSE: 95 mg/dL (ref 65–99)
Potassium: 4.6 mmol/L (ref 3.5–5.2)
SODIUM: 140 mmol/L (ref 134–144)
Total Protein: 6.9 g/dL (ref 6.0–8.5)

## 2018-03-03 LAB — CBC WITH DIFFERENTIAL
BASOS ABS: 0 10*3/uL (ref 0.0–0.2)
Basos: 1 %
EOS (ABSOLUTE): 0.1 10*3/uL (ref 0.0–0.4)
EOS: 3 %
HEMOGLOBIN: 12.7 g/dL (ref 11.1–15.9)
Hematocrit: 38.4 % (ref 34.0–46.6)
IMMATURE GRANS (ABS): 0 10*3/uL (ref 0.0–0.1)
IMMATURE GRANULOCYTES: 0 %
Lymphocytes Absolute: 1.8 10*3/uL (ref 0.7–3.1)
Lymphs: 41 %
MCH: 28.8 pg (ref 26.6–33.0)
MCHC: 33.1 g/dL (ref 31.5–35.7)
MCV: 87 fL (ref 79–97)
Monocytes Absolute: 0.4 10*3/uL (ref 0.1–0.9)
Monocytes: 10 %
Neutrophils Absolute: 2 10*3/uL (ref 1.4–7.0)
Neutrophils: 45 %
RBC: 4.41 x10E6/uL (ref 3.77–5.28)
RDW: 12.5 % (ref 12.3–15.4)
WBC: 4.3 10*3/uL (ref 3.4–10.8)

## 2018-03-03 LAB — FOLATE

## 2018-03-03 LAB — HEMOGLOBIN A1C
Est. average glucose Bld gHb Est-mCnc: 111 mg/dL
HEMOGLOBIN A1C: 5.5 % (ref 4.8–5.6)

## 2018-03-03 LAB — VITAMIN B12: Vitamin B-12: 536 pg/mL (ref 232–1245)

## 2018-03-03 LAB — INSULIN, RANDOM: INSULIN: 5.8 u[IU]/mL (ref 2.6–24.9)

## 2018-03-03 LAB — T4, FREE: FREE T4: 1.15 ng/dL (ref 0.82–1.77)

## 2018-03-03 LAB — T3: T3, Total: 133 ng/dL (ref 71–180)

## 2018-03-03 LAB — LIPID PANEL WITH LDL/HDL RATIO
CHOLESTEROL TOTAL: 206 mg/dL — AB (ref 100–199)
HDL: 67 mg/dL (ref >39)
LDL CALC: 128 mg/dL — AB (ref 0–99)
LDl/HDL Ratio: 1.9 ratio (ref 0.0–3.2)
Triglycerides: 56 mg/dL (ref 0–149)
VLDL Cholesterol Cal: 11 mg/dL (ref 5–40)

## 2018-03-03 LAB — VITAMIN D 25 HYDROXY (VIT D DEFICIENCY, FRACTURES): Vit D, 25-Hydroxy: 43.7 ng/mL (ref 30.0–100.0)

## 2018-03-03 LAB — TSH: TSH: 1.87 u[IU]/mL (ref 0.450–4.500)

## 2018-03-23 ENCOUNTER — Ambulatory Visit (INDEPENDENT_AMBULATORY_CARE_PROVIDER_SITE_OTHER): Payer: 59 | Admitting: Family Medicine

## 2018-03-23 VITALS — BP 141/89 | HR 71 | Temp 97.6°F | Ht 66.0 in | Wt 176.0 lb

## 2018-03-23 DIAGNOSIS — Z6830 Body mass index (BMI) 30.0-30.9, adult: Secondary | ICD-10-CM | POA: Diagnosis not present

## 2018-03-23 DIAGNOSIS — E559 Vitamin D deficiency, unspecified: Secondary | ICD-10-CM

## 2018-03-23 DIAGNOSIS — I10 Essential (primary) hypertension: Secondary | ICD-10-CM

## 2018-03-23 DIAGNOSIS — Z9189 Other specified personal risk factors, not elsewhere classified: Secondary | ICD-10-CM

## 2018-03-23 DIAGNOSIS — E669 Obesity, unspecified: Secondary | ICD-10-CM | POA: Diagnosis not present

## 2018-03-23 MED ORDER — VITAMIN D (ERGOCALCIFEROL) 1.25 MG (50000 UNIT) PO CAPS: 50000.0000 [IU] | ORAL_CAPSULE | ORAL | 0 refills | Status: DC

## 2018-03-24 MED FILL — VIT D2 1.25 MG (50,000 UNIT: 1.25 MG | 28 days supply | Qty: 4 | Fill #0

## 2018-03-28 NOTE — Progress Notes (Signed)
Office: [phone]  /  Fax: [phone]   HPI:   Chief Complaint: OBESITY [name] is here to discuss her progress with her obesity treatment plan. She is on the lower carbohydrate, vegetable and lean protein rich diet plan and is following her eating plan approximately 95 % of the time. She states she is exercising 0 minutes 0 times per week. [name] is enjoying the low carbohydrate plan and not needing to measure all food, but being able to eat any food on low carbohydrate plan.  Her weight is 176 lb (79.8 kg) today and has not lost weight since her last visit. She has lost 12 lbs since starting treatment with [name].  Vitamin D Deficiency [name] has a diagnosis of vitamin D deficiency. She is currently taking prescription Vit D. She notes fatigue and denies nausea, vomiting or muscle weakness.  At risk for osteopenia and osteoporosis [name] is at higher risk of osteopenia and osteoporosis due to vitamin D deficiency.   Hypertension [name] is a 47 y.o. female with hypertension. [name]'s blood pressure is slightly elevated today. She denies chest pain, chest pressure, or headache, and she is not on medications. She is working weight loss to help control her blood pressure with the goal of decreasing her risk of heart attack and stroke. [name]'s blood pressure is not currently controlled.  ALLERGIES: No Known Allergies  MEDICATIONS: Current Outpatient Medications on File Prior to Visit  Medication Sig Dispense Refill  . ibuprofen (ADVIL,MOTRIN) 400 MG tablet Take 1 tablet (400 mg total) by mouth 3 (three) times daily. 30 tablet 0  . SUMAtriptan (IMITREX) 50 MG tablet Take 50 mg by mouth every 2 (two) hours as needed for migraine. May repeat in 2 hours if headache persists or recurs.    . vitamin A 10000 UNIT capsule Take 50,000 Units by mouth once a week.     No current facility-administered medications on file prior to visit.     PAST MEDICAL HISTORY: Past Medical History:  Diagnosis Date    . Anemia   . Asthma    as child  . Back pain   . GERD (gastroesophageal reflux disease)   . Headache   . Hypertension   . Joint pain   . Migraines   . Parathyroid abnormality ([place])   . Swelling    feet and legs  . Vitamin D deficiency     PAST SURGICAL HISTORY: Past Surgical History:  Procedure Laterality Date  . FOOT SURGERY Bilateral 2001  . PARATHYROIDECTOMY N/A 01/03/2016   Procedure: PARATHYROIDECTOMY;  Surgeon: Leta Baptist, MD;  Location: [facility];  Service: ENT;  Laterality: N/A;  . TUBAL LIGATION      SOCIAL HISTORY: Social History   Tobacco Use  . Smoking status: Never Smoker  . Smokeless tobacco: Never Used  Substance Use Topics  . Alcohol use: No  . Drug use: No    FAMILY HISTORY: Family History  Problem Relation Age of Onset  . Hypertension Father   . Cancer Maternal Grandmother   . Heart disease Paternal Grandmother   . Cancer Paternal Grandmother     ROS: Review of Systems  Constitutional: Positive for malaise/fatigue. Negative for weight loss.  Cardiovascular: Negative for chest pain.       Negative chest pressure  Gastrointestinal: Negative for nausea and vomiting.  Musculoskeletal:       Negative muscle weakness  Neurological: Negative for headaches.    PHYSICAL EXAM: Blood pressure (!) 141/89, pulse 71,  temperature 97.6 F (36.4 C), temperature source Oral, height 5\' 6"  (1.676 m), weight 176 lb (79.8 kg), SpO2 96 %. Body mass index is 28.41 kg/m. Physical Exam  Constitutional: She is oriented to person, place, and time. She appears well-developed and well-nourished.  Cardiovascular: Normal rate.  Pulmonary/Chest: Effort normal.  Musculoskeletal: Normal range of motion.  Neurological: She is oriented to person, place, and time.  Skin: Skin is warm and dry.  Psychiatric: She has a normal mood and affect. Her behavior is normal.  Vitals reviewed.   RECENT LABS AND TESTS: BMET    Component Value Date/Time   NA 140  03/02/2018 0746   K 4.6 03/02/2018 0746   CL 103 03/02/2018 0746   CO2 21 03/02/2018 0746   GLUCOSE 95 03/02/2018 0746   BUN 27 (H) 03/02/2018 0746   CREATININE 0.95 03/02/2018 0746   CALCIUM 9.2 03/02/2018 0746   CALCIUM 7.5 (L) 01/03/2016 1520   GFRNONAA 72 03/02/2018 0746   GFRAA 82 03/02/2018 0746   Lab Results  Component Value Date   HGBA1C 5.5 03/02/2018   HGBA1C 5.5 09/01/2017   HGBA1C 5.6 11/10/2016   HGBA1C 5.5 08/13/2016   Lab Results  Component Value Date   INSULIN 5.8 03/02/2018   INSULIN 3.7 09/01/2017   INSULIN 5.6 11/10/2016   INSULIN 7.9 08/13/2016   CBC    Component Value Date/Time   WBC 4.3 03/02/2018 0746   RBC 4.41 03/02/2018 0746   HGB 12.7 03/02/2018 0746   HCT 38.4 03/02/2018 0746   MCV 87 03/02/2018 0746   MCH 28.8 03/02/2018 0746   MCHC 33.1 03/02/2018 0746   RDW 12.5 03/02/2018 0746   LYMPHSABS 1.8 03/02/2018 0746   EOSABS 0.1 03/02/2018 0746   BASOSABS 0.0 03/02/2018 0746   Iron/TIBC/Ferritin/ %Sat No results found for: IRON, TIBC, FERRITIN, IRONPCTSAT Lipid Panel     Component Value Date/Time   CHOL 206 (H) 03/02/2018 0746   TRIG 56 03/02/2018 0746   HDL 67 03/02/2018 0746   LDLCALC 128 (H) 03/02/2018 0746   Hepatic Function Panel     Component Value Date/Time   PROT 6.9 03/02/2018 0746   ALBUMIN 4.3 03/02/2018 0746   AST 23 03/02/2018 0746   ALT 20 03/02/2018 0746   ALKPHOS 49 03/02/2018 0746   BILITOT 0.3 03/02/2018 0746      Component Value Date/Time   TSH 1.870 03/02/2018 0746   TSH 2.080 08/13/2016 0928  Results for Symonds, Kaylani R (MRN [id number]) as of 03/28/2018 15:35  Ref. Range 03/02/2018 07:46  Vitamin D, 25-Hydroxy Latest Ref Range: 30.0 - 100.0 ng/mL 43.7    ASSESSMENT AND PLAN: Vitamin D deficiency - Plan: Vitamin D, Ergocalciferol, (DRISDOL) 50000 units CAPS capsule  Essential hypertension  At risk for osteoporosis  Class 1 obesity with serious comorbidity and body mass index (BMI) of 30.0 to 30.9 in  adult, unspecified obesity type - Starting BMI greater then 30  PLAN:  Vitamin D Deficiency [name] was informed that low vitamin D levels contributes to fatigue and are associated with obesity, breast, and colon cancer. [name] agrees to continue taking prescription Vit D @50 ,000 IU every week #4 and we will refill for 1 month. She will follow up for routine testing of vitamin D, at least 2-3 times per year. She was informed of the risk of over-replacement of vitamin D and agrees to not increase her dose unless she discusses this with [name] first. [name] agrees to follow up with our clinic in 3  weeks.  At risk for osteopenia and osteoporosis [name] was given extended  (15 minutes) osteoporosis prevention counseling today. [name] is at risk for osteopenia and osteoporsis due to her vitamin D deficiency. She was encouraged to take her vitamin D and follow her higher calcium diet and increase strengthening exercise to help strengthen her bones and decrease her risk of osteopenia and osteoporosis.  Hypertension We discussed sodium restriction, working on healthy weight loss, and a regular exercise program as the means to achieve improved blood pressure control. [name] agreed with this plan and agreed to follow up as directed. We will continue to monitor her blood pressure as well as her progress with the above lifestyle modifications. She will watch for signs of hypotension as she continues her lifestyle modifications and we will follow up on blood pressure at next appointment. [name] agrees to follow up with our clinic in 3 weeks.  Obesity [name] is currently in the action stage of change. As such, her goal is to continue with weight loss efforts She has agreed to follow a lower carbohydrate, vegetable and lean protein rich diet plan [name] has been instructed to work up to a goal of 150 minutes of combined cardio and strengthening exercise per week for weight loss and overall health benefits. We discussed the following  Behavioral Modification Strategies today: increasing lean protein intake, increasing vegetables, work on meal planning and easy cooking plans, and planning for success   [name] has agreed to follow up with our clinic in 3 weeks. She was informed of the importance of frequent follow up visits to maximize her success with intensive lifestyle modifications for her multiple health conditions.   OBESITY BEHAVIORAL INTERVENTION VISIT  Today's visit was # 31   Starting weight: 188 lbs Starting date: 08/13/16 Today's weight : 176 lbs  Today's date: 03/23/2018 Total lbs lost to date: 12    ASK: We discussed the diagnosis of obesity with [name] today and [name] agreed to give [name] permission to discuss obesity behavioral modification therapy today.  ASSESS: [name] has the diagnosis of obesity and her BMI today is 28.42 [name] is in the action stage of change   ADVISE: [name] was educated on the multiple health risks of obesity as well as the benefit of weight loss to improve her health. She was advised of the need for long term treatment and the importance of lifestyle modifications to improve her current health and to decrease her risk of future health problems.  AGREE: Multiple dietary modification options and treatment options were discussed and  [name] agreed to follow the recommendations documented in the above note.  ARRANGE: [name] was educated on the importance of frequent visits to treat obesity as outlined per CMS and USPSTF guidelines and agreed to schedule her next follow up appointment today.  I, Trixie Dredge, am acting as transcriptionist for Ilene Qua, MD  I have reviewed the above documentation for accuracy and completeness, and I agree with the above. - Ilene Qua, MD

## 2018-04-13 ENCOUNTER — Ambulatory Visit (INDEPENDENT_AMBULATORY_CARE_PROVIDER_SITE_OTHER): Payer: 59 | Admitting: Family Medicine

## 2018-04-13 VITALS — BP 148/95 | HR 67 | Temp 97.9°F | Ht 66.0 in | Wt 176.0 lb

## 2018-04-13 DIAGNOSIS — Z8669 Personal history of other diseases of the nervous system and sense organs: Secondary | ICD-10-CM | POA: Diagnosis not present

## 2018-04-13 DIAGNOSIS — I10 Essential (primary) hypertension: Secondary | ICD-10-CM

## 2018-04-13 DIAGNOSIS — Z6830 Body mass index (BMI) 30.0-30.9, adult: Secondary | ICD-10-CM

## 2018-04-13 DIAGNOSIS — E559 Vitamin D deficiency, unspecified: Secondary | ICD-10-CM

## 2018-04-13 DIAGNOSIS — E669 Obesity, unspecified: Secondary | ICD-10-CM

## 2018-04-13 DIAGNOSIS — Z23 Encounter for immunization: Secondary | ICD-10-CM | POA: Diagnosis not present

## 2018-04-13 DIAGNOSIS — J329 Chronic sinusitis, unspecified: Secondary | ICD-10-CM | POA: Diagnosis not present

## 2018-04-13 NOTE — Progress Notes (Signed)
Office: [phone]  /  Fax: [phone]   HPI:   Chief Complaint: OBESITY [name] is here to discuss her progress with her obesity treatment plan. She is on the lower carbohydrate, vegetable and lean protein rich diet plan and is following her eating plan approximately 100 % of the time. She states she is walking 45 to 60 minutes 2 times per week. [name] has had lots of stress at work and she has made some indulgences in donuts and sweets at work. [name] likes being able to eat when she is hungry. Her weight is 176 lb (79.8 kg) today and she has maintained weight over a period of 3 weeks since her last visit. She has lost 12 lbs since starting treatment with [name].  Hypertension [name] is a 47 y.o. female with hypertension. Her blood Lane is elevated today. [name] denies chest pain, chest Lane or headache. She is working weight loss to help control her blood Lane with the goal of decreasing her risk of heart attack and stroke. [name] is not currently controlled.  Vitamin D deficiency [name] has a diagnosis of vitamin D deficiency. [name] is currently taking vit D and she admits fatigue, but denies nausea, vomiting or muscle weakness.  ALLERGIES: No Known Allergies  MEDICATIONS: Current Outpatient Medications on File Prior to Visit  Medication Sig Dispense Refill  . ibuprofen (ADVIL,MOTRIN) 400 MG tablet Take 1 tablet (400 mg total) by mouth 3 (three) times daily. 30 tablet 0  . SUMAtriptan (IMITREX) 50 MG tablet Take 50 mg by mouth every 2 (two) hours as needed for migraine. May repeat in 2 hours if headache persists or recurs.    . vitamin A 10000 UNIT capsule Take 50,000 Units by mouth once a week.    . Vitamin D, Ergocalciferol, (DRISDOL) 50000 units CAPS capsule Take 1 capsule (50,000 Units total) by mouth every 7 (seven) days. 4 capsule 0   No current facility-administered medications on file prior to visit.     PAST MEDICAL HISTORY: Past Medical  History:  Diagnosis Date  . Anemia   . Asthma    as child  . Back pain   . GERD (gastroesophageal reflux disease)   . Headache   . Hypertension   . Joint pain   . Migraines   . Parathyroid abnormality ([place])   . Swelling    feet and legs  . Vitamin D deficiency     PAST SURGICAL HISTORY: Past Surgical History:  Procedure Laterality Date  . FOOT SURGERY Bilateral 2001  . PARATHYROIDECTOMY N/A 01/03/2016   Procedure: PARATHYROIDECTOMY;  Surgeon: Leta Baptist, MD;  Location: [facility];  Service: ENT;  Laterality: N/A;  . TUBAL LIGATION      SOCIAL HISTORY: Social History   Tobacco Use  . Smoking status: Never Smoker  . Smokeless tobacco: Never Used  Substance Use Topics  . Alcohol use: No  . Drug use: No    FAMILY HISTORY: Family History  Problem Relation Age of Onset  . Hypertension Father   . Cancer Maternal Grandmother   . Heart disease Paternal Grandmother   . Cancer Paternal Grandmother     ROS: Review of Systems  Constitutional: Positive for malaise/fatigue. Negative for weight loss.  Cardiovascular: Negative for chest pain.       Negative for chest Lane  Gastrointestinal: Negative for nausea and vomiting.  Musculoskeletal:       Negative for muscle weakness  Neurological: Negative for headaches.  PHYSICAL EXAM: Blood Lane (!) 148/95, pulse 67, temperature 97.9 F (36.6 C), temperature source Oral, height 5\' 6"  (1.676 m), weight 176 lb (79.8 kg), SpO2 97 %. Body mass index is 28.41 kg/m. Physical Exam  Constitutional: She is oriented to person, place, and time. She appears well-developed and well-nourished.  Cardiovascular: Normal rate.  Pulmonary/Chest: Effort normal.  Musculoskeletal: Normal range of motion.  Neurological: She is oriented to person, place, and time.  Skin: Skin is warm and dry.  Psychiatric: She has a normal mood and affect. Her behavior is normal.  Vitals reviewed.   RECENT LABS AND TESTS: BMET      Component Value Date/Time   NA 140 03/02/2018 0746   [name] 4.6 03/02/2018 0746   CL 103 03/02/2018 0746   CO2 21 03/02/2018 0746   GLUCOSE 95 03/02/2018 0746   BUN 27 (H) 03/02/2018 0746   CREATININE 0.95 03/02/2018 0746   CALCIUM 9.2 03/02/2018 0746   CALCIUM 7.5 (L) 01/03/2016 1520   GFRNONAA 72 03/02/2018 0746   GFRAA 82 03/02/2018 0746   Lab Results  Component Value Date   HGBA1C 5.5 03/02/2018   HGBA1C 5.5 09/01/2017   HGBA1C 5.6 11/10/2016   HGBA1C 5.5 08/13/2016   Lab Results  Component Value Date   INSULIN 5.8 03/02/2018   INSULIN 3.7 09/01/2017   INSULIN 5.6 11/10/2016   INSULIN 7.9 08/13/2016   CBC    Component Value Date/Time   WBC 4.3 03/02/2018 0746   RBC 4.41 03/02/2018 0746   HGB 12.7 03/02/2018 0746   HCT 38.4 03/02/2018 0746   MCV 87 03/02/2018 0746   MCH 28.8 03/02/2018 0746   MCHC 33.1 03/02/2018 0746   RDW 12.5 03/02/2018 0746   LYMPHSABS 1.8 03/02/2018 0746   EOSABS 0.1 03/02/2018 0746   BASOSABS 0.0 03/02/2018 0746   Iron/TIBC/Ferritin/ %Sat No results found for: IRON, TIBC, FERRITIN, IRONPCTSAT Lipid Panel     Component Value Date/Time   CHOL 206 (H) 03/02/2018 0746   TRIG 56 03/02/2018 0746   HDL 67 03/02/2018 0746   LDLCALC 128 (H) 03/02/2018 0746   Hepatic Function Panel     Component Value Date/Time   PROT 6.9 03/02/2018 0746   ALBUMIN 4.3 03/02/2018 0746   AST 23 03/02/2018 0746   ALT 20 03/02/2018 0746   ALKPHOS 49 03/02/2018 0746   BILITOT 0.3 03/02/2018 0746      Component Value Date/Time   TSH 1.870 03/02/2018 0746   TSH 2.080 08/13/2016 0928   Results for Frigon, Maansi R (MRN [id number]) as of 04/13/2018 17:00  Ref. Range 03/02/2018 07:46  Vitamin D, 25-Hydroxy Latest Ref Range: 30.0 - 100.0 ng/mL 43.7   ASSESSMENT AND PLAN: Essential hypertension  Vitamin D deficiency  Class 1 obesity with serious comorbidity and body mass index (BMI) of 30.0 to 30.9 in adult, unspecified obesity type - Starting BMI greater then  30  PLAN:  Hypertension We discussed sodium restriction, working on healthy weight loss, and a regular exercise program as the means to achieve improved blood Lane control. [name] agreed with this plan and agreed to follow up as directed. We will continue to monitor her blood Lane as well as her progress with the above lifestyle modifications. She is to discuss starting medication with her PCP at her scheduled appointment. [name] will watch for signs of hypotension as she continues her lifestyle modifications.  Vitamin D Deficiency [name] was informed that low vitamin D levels contributes to fatigue and are associated with obesity,  breast, and colon cancer. She agrees to continue to take prescription Vit D @50 ,000 IU every week and will follow up for routine testing of vitamin D, at least 2-3 times per year. She was informed of the risk of over-replacement of vitamin D and agrees to not increase her dose unless she discusses this with [name] first.  I spent > than 50% of the 15 minute visit on counseling as documented in the note.  Obesity [name] is currently in the action stage of change. As such, her goal is to continue with weight loss efforts She has agreed to follow a lower carbohydrate, vegetable and lean protein rich diet plan [name] has been instructed to do 30 minute circuit weights at [employer] 2 times per week for weight loss and overall health benefits. We discussed the following Behavioral Modification Strategies today: planning for success, increasing lean protein intake, increasing vegetables and work on meal planning and easy cooking plans  [name] has agreed to follow up with our clinic in 2 weeks. She was informed of the importance of frequent follow up visits to maximize her success with intensive lifestyle modifications for her multiple health conditions.   OBESITY BEHAVIORAL INTERVENTION VISIT  Today's visit was # 32  Starting weight: 188 lbs Starting date: 08/13/16 Today's  weight : 176 lbs Today's date: 04/13/2018 Total lbs lost to date: 12 At least 15 minutes were spent on discussing the following behavioral intervention visit.   ASK: We discussed the diagnosis of obesity with [name] today and [name] agreed to give [name] permission to discuss obesity behavioral modification therapy today.  ASSESS: [name] has the diagnosis of obesity and her BMI today is 28.42 [name] is in the action stage of change   ADVISE: [name] was educated on the multiple health risks of obesity as well as the benefit of weight loss to improve her health. She was advised of the need for long term treatment and the importance of lifestyle modifications to improve her current health and to decrease her risk of future health problems.  AGREE: Multiple dietary modification options and treatment options were discussed and  [name] agreed to follow the recommendations documented in the above note.  ARRANGE: Kadesha was educated on the importance of frequent visits to treat obesity as outlined per CMS and USPSTF guidelines and agreed to schedule her next follow up appointment today.  I, Doreene Nest, am acting as transcriptionist for Eber Jones, MD  I have reviewed the above documentation for accuracy and completeness, and I agree with the above. - Ilene Qua, MD

## 2018-04-14 DIAGNOSIS — Z23 Encounter for immunization: Secondary | ICD-10-CM

## 2018-04-14 HISTORY — DX: Encounter for immunization: Z23

## 2018-04-25 ENCOUNTER — Encounter: Payer: Self-pay | Admitting: Gastroenterology

## 2018-04-27 ENCOUNTER — Encounter (INDEPENDENT_AMBULATORY_CARE_PROVIDER_SITE_OTHER): Payer: Self-pay | Admitting: Family Medicine

## 2018-04-27 ENCOUNTER — Ambulatory Visit (INDEPENDENT_AMBULATORY_CARE_PROVIDER_SITE_OTHER): Payer: 59 | Admitting: Family Medicine

## 2018-04-27 VITALS — BP 146/92 | HR 71 | Temp 98.3°F | Ht 66.0 in | Wt 179.0 lb

## 2018-04-27 DIAGNOSIS — Z9189 Other specified personal risk factors, not elsewhere classified: Secondary | ICD-10-CM | POA: Diagnosis not present

## 2018-04-27 DIAGNOSIS — E559 Vitamin D deficiency, unspecified: Secondary | ICD-10-CM | POA: Diagnosis not present

## 2018-04-27 DIAGNOSIS — I10 Essential (primary) hypertension: Secondary | ICD-10-CM | POA: Diagnosis not present

## 2018-04-27 DIAGNOSIS — E663 Overweight: Secondary | ICD-10-CM

## 2018-04-27 MED ORDER — VITAMIN D (ERGOCALCIFEROL) 1.25 MG (50000 UNIT) PO CAPS: 50000.0000 [IU] | ORAL_CAPSULE | ORAL | 0 refills | Status: DC

## 2018-04-27 NOTE — Progress Notes (Addendum)
Office: [phone]  /  Fax: [phone]   HPI:   Chief Complaint: OBESITY [name] is here to discuss her progress with her obesity treatment plan. She is on the  follow a lower carbohydrate, vegetable and lean protein rich diet plan and is following her eating plan approximately 95 % of the time. She denies constipation. She states she is doing a combination of cardio and resistance training 45-60 minutes 3 times per week. She began this regimen 2-3 weeks ago.  [name] is currently struggling with snacking too much on the weekends on carbohydrate rich foods. Her weight goal is 170 lbs.  Her weight is 179 lb (81.2 kg) today and has not lost weight since her last visit. She has lost 9 lbs since starting treatment with [name].  Vitamin D deficiency [name] has a diagnosis of vitamin D deficiency. Vitamin D level is not at goal.  She is currently taking vit D and denies nausea, vomiting or muscle weakness.  At risk for osteopenia and osteoporosis [name] is at higher risk of osteopenia and osteoporosis due to vitamin D deficiency.   Hypertension [name] is a 47 y.o. female with hypertension. Blood pressure is not well controlled. She is currently taking amlodipine 5 mg daily per her PCP. She denies chest pain, and shortness of breath. She reports having some headaches and being able to "hear her heart beat in her ears."   She is working weight loss to help control her blood pressure with the goal of decreasing her risk of heart attack and stroke.   ALLERGIES: No Known Allergies  MEDICATIONS: Current Outpatient Medications on File Prior to Visit  Medication Sig Dispense Refill  . ibuprofen (ADVIL,MOTRIN) 400 MG tablet Take 1 tablet (400 mg total) by mouth 3 (three) times daily. 30 tablet 0  . SUMAtriptan (IMITREX) 50 MG tablet Take 50 mg by mouth every 2 (two) hours as needed for migraine. May repeat in 2 hours if headache persists or recurs.    . vitamin A 10000 UNIT capsule Take 50,000 Units by  mouth once a week.     No current facility-administered medications on file prior to visit.     PAST MEDICAL HISTORY: Past Medical History:  Diagnosis Date  . Anemia   . Asthma    as child  . Back pain   . GERD (gastroesophageal reflux disease)   . Headache   . Hypertension   . Joint pain   . Migraines   . Parathyroid abnormality ([place])   . Swelling    feet and legs  . Vitamin D deficiency     PAST SURGICAL HISTORY: Past Surgical History:  Procedure Laterality Date  . FOOT SURGERY Bilateral 2001  . PARATHYROIDECTOMY N/A 01/03/2016   Procedure: PARATHYROIDECTOMY;  Surgeon: Leta Baptist, MD;  Location: [place];  Service: ENT;  Laterality: N/A;  . TUBAL LIGATION      SOCIAL HISTORY: Social History   Tobacco Use  . Smoking status: Never Smoker  . Smokeless tobacco: Never Used  Substance Use Topics  . Alcohol use: No  . Drug use: No    FAMILY HISTORY: Family History  Problem Relation Age of Onset  . Hypertension Father   . Cancer Maternal Grandmother   . Heart disease Paternal Grandmother   . Cancer Paternal Grandmother     ROS: Review of Systems  Constitutional: Negative for weight loss.  Respiratory: Negative for shortness of breath.   Cardiovascular: Negative for chest pain.  Gastrointestinal:  Negative for constipation, nausea and vomiting.  Neurological: Positive for headaches. Negative for focal weakness.    PHYSICAL EXAM: Blood pressure (!) 146/92, pulse 71, temperature 98.3 F (36.8 C), height 5\' 6"  (1.676 m), weight 179 lb (81.2 kg), SpO2 99 %. Body mass index is 28.89 kg/m. Physical Exam  Constitutional: She is oriented to person, place, and time. She appears well-developed and well-nourished.  Cardiovascular: Normal rate.  Pulmonary/Chest: Effort normal.  Musculoskeletal: Normal range of motion.  Neurological: She is alert and oriented to person, place, and time.  Skin: Skin is warm and dry.  Psychiatric: She has a normal mood  and affect. Her behavior is normal.  Vitals reviewed.   RECENT LABS AND TESTS: BMET    Component Value Date/Time   NA 140 03/02/2018 0746   K 4.6 03/02/2018 0746   CL 103 03/02/2018 0746   CO2 21 03/02/2018 0746   GLUCOSE 95 03/02/2018 0746   BUN 27 (H) 03/02/2018 0746   CREATININE 0.95 03/02/2018 0746   CALCIUM 9.2 03/02/2018 0746   CALCIUM 7.5 (L) 01/03/2016 1520   GFRNONAA 72 03/02/2018 0746   GFRAA 82 03/02/2018 0746   Lab Results  Component Value Date   HGBA1C 5.5 03/02/2018   HGBA1C 5.5 09/01/2017   HGBA1C 5.6 11/10/2016   HGBA1C 5.5 08/13/2016   Lab Results  Component Value Date   INSULIN 5.8 03/02/2018   INSULIN 3.7 09/01/2017   INSULIN 5.6 11/10/2016   INSULIN 7.9 08/13/2016   CBC    Component Value Date/Time   WBC 4.3 03/02/2018 0746   RBC 4.41 03/02/2018 0746   HGB 12.7 03/02/2018 0746   HCT 38.4 03/02/2018 0746   MCV 87 03/02/2018 0746   MCH 28.8 03/02/2018 0746   MCHC 33.1 03/02/2018 0746   RDW 12.5 03/02/2018 0746   LYMPHSABS 1.8 03/02/2018 0746   EOSABS 0.1 03/02/2018 0746   BASOSABS 0.0 03/02/2018 0746   Iron/TIBC/Ferritin/ %Sat No results found for: IRON, TIBC, FERRITIN, IRONPCTSAT Lipid Panel     Component Value Date/Time   CHOL 206 (H) 03/02/2018 0746   TRIG 56 03/02/2018 0746   HDL 67 03/02/2018 0746   LDLCALC 128 (H) 03/02/2018 0746   Hepatic Function Panel     Component Value Date/Time   PROT 6.9 03/02/2018 0746   ALBUMIN 4.3 03/02/2018 0746   AST 23 03/02/2018 0746   ALT 20 03/02/2018 0746   ALKPHOS 49 03/02/2018 0746   BILITOT 0.3 03/02/2018 0746      Component Value Date/Time   TSH 1.870 03/02/2018 0746   TSH 2.080 08/13/2016 0928  Results for Pryce, Taneeka R (MRN [id number]) as of 04/27/2018 11:21  Ref. Range 03/02/2018 07:46  Vitamin D, 25-Hydroxy Latest Ref Range: 30.0 - 100.0 ng/mL 43.7    ASSESSMENT AND PLAN: Vitamin D deficiency - Plan: Vitamin D, Ergocalciferol, (DRISDOL) 50000 units CAPS capsule  At high risk  for osteoporosis  Essential hypertension  Overweight (BMI 25.0-29.9)  PLAN:  Vitamin D Deficiency [name] was informed that low vitamin D levels contributes to fatigue and are associated with obesity, breast, and colon cancer. She agrees to continue to take prescription Vit D @50 ,000 IU every week #4 no refills. This was refilled today. [name] will follow up for routine testing of vitamin D, at least 2-3 times per year. She was informed of the risk of over-replacement of vitamin D and agrees to not increase her dose unless she discusses this with [name] first.  At risk for osteopenia and osteoporosis  [name] was given extended  (15 minutes) osteoporosis prevention counseling today. [name] is at risk for osteopenia and osteoporosis due to her vitamin D deficiency. She was encouraged to take her vitamin D and follow her higher calcium diet and increase strengthening exercise to help strengthen her bones and decrease her risk of osteopenia and osteoporosis.  Hypertension We discussed sodium restriction, working on healthy weight loss, and a regular exercise program as the means to achieve improved blood pressure control. She was recently preecribed amlodipine 5 mg by her PCP but hypertension is not well controlled. We discussed increasing amlodipine to 10 mg for the next 2 weeks and she agreed. She will take two of the 5 mg she has and does not need a new prescription. We will assess in 2 weeks.   [name] agreed with this plan and agreed to follow up as directed. We will continue to monitor her blood pressure as well as her progress with the above lifestyle modifications. She will continue her medications as prescribed and will watch for signs of hypotension as she continues her lifestyle modifications.   Obesity [name] is currently in the action stage of change. As such, her goal is to continue with weight loss efforts She has agreed to follow a lower carbohydrate, vegetable and lean protein rich diet plan [name]  has been instructed to continue her regimen of a combination of cardio and resistance training 45-60 minutes 3 times per week. We discussed the following Behavioral Modification Stratagies today: avoiding temptations, better snacking choices, and reducing snacking overall.   [name] has agreed to follow up with our clinic in 2 weeks. She was informed of the importance of frequent follow up visits to maximize her success with intensive lifestyle modifications for her multiple health conditions.   OBESITY BEHAVIORAL INTERVENTION VISIT  Today's visit was # 33 Starting weight: 188 lbs Starting date: 08/13/16 Today's weight : Weight: 179 lb (81.2 kg)  Today's date: 04/27/2018 Total lbs lost to date: 9   ASK: We discussed the diagnosis of obesity with [name] today and [name] agreed to give [name] permission to discuss obesity behavioral modification therapy today.  ASSESS: [name] has the diagnosis of obesity and her BMI today is 28.91 [name] is in the action stage of change   ADVISE: [name] was educated on the multiple health risks of obesity as well as the benefit of weight loss to improve her health. She was advised of the need for long term treatment and the importance of lifestyle modifications to improve her current health and to decrease her risk of future health problems.  AGREE: Multiple dietary modification options and treatment options were discussed and  [name] agreed to follow the recommendations documented in the above note.  ARRANGE: [name] was educated on the importance of frequent visits to treat obesity as outlined per CMS and USPSTF guidelines and agreed to schedule her next follow up appointment today.

## 2018-05-09 MED FILL — VIT D2 1.25 MG (50,000 UNIT: 1.25 MG | 28 days supply | Qty: 4 | Fill #0

## 2018-05-09 MED FILL — SUMAtriptan SUCCINATE 50 MG: 50 | 90 days supply | Qty: 27 | Fill #1

## 2018-05-09 MED FILL — AMLODIPINE BESYLATE 5 MG TA: 5 | 90 days supply | Qty: 90 | Fill #0

## 2018-05-11 ENCOUNTER — Ambulatory Visit (INDEPENDENT_AMBULATORY_CARE_PROVIDER_SITE_OTHER): Payer: 59 | Admitting: Family Medicine

## 2018-05-11 ENCOUNTER — Encounter (INDEPENDENT_AMBULATORY_CARE_PROVIDER_SITE_OTHER): Payer: Self-pay | Admitting: Family Medicine

## 2018-05-11 VITALS — BP 128/83 | HR 68 | Temp 98.1°F | Ht 66.0 in | Wt 181.0 lb

## 2018-05-11 DIAGNOSIS — E663 Overweight: Secondary | ICD-10-CM | POA: Insufficient documentation

## 2018-05-11 DIAGNOSIS — E559 Vitamin D deficiency, unspecified: Secondary | ICD-10-CM | POA: Diagnosis not present

## 2018-05-11 DIAGNOSIS — I10 Essential (primary) hypertension: Secondary | ICD-10-CM | POA: Diagnosis not present

## 2018-05-11 HISTORY — DX: Overweight: E66.3

## 2018-05-11 NOTE — Progress Notes (Signed)
Office: [phone]  /  Fax: [phone]   HPI:   Chief Complaint: Overweight [name] is here to discuss her progress with her obesity treatment plan. She is on the lower carbohydrate, vegetable, and lean protein rich diet plan and keeping a food journal and she is following her eating plan approximately 95 % of the time. She states she is doing strength training 90 minutes 3 to 4 times per week. Jari started journaling 3 days ago. She wants to journal over the holidays rather than the low carb plan. She has reduced her sweets intake recently.  Her weight is 181 lb (82.1 kg) today and has had a weight gain of 2 pounds over a period of 2 weeks since her last visit. She has lost 7 lbs since starting treatment with [name].  Hypertension [name] is a 47 y.o. female with hypertension. She is working on weight loss to help control her blood pressure with the goal of decreasing her risk of heart attack and stroke. [name]'s blood pressure is currently well controlled. [name] admits an occasional headache and has a history of migraines. She denies chest pain or shortness of breath on exertion.  Vitamin D deficiency [name] has a diagnosis of vitamin D deficiency. She is currently taking vit D and is stable, but not at goal. She denies nausea, vomiting, or muscle weakness.  ALLERGIES: No Known Allergies  MEDICATIONS: Current Outpatient Medications on File Prior to Visit  Medication Sig Dispense Refill  . amLODipine (NORVASC) 5 MG tablet Take 5 mg by mouth daily.    Marland Kitchen ibuprofen (ADVIL,MOTRIN) 400 MG tablet Take 1 tablet (400 mg total) by mouth 3 (three) times daily. 30 tablet 0  . SUMAtriptan (IMITREX) 50 MG tablet Take 50 mg by mouth every 2 (two) hours as needed for migraine. May repeat in 2 hours if headache persists or recurs.    . vitamin A 10000 UNIT capsule Take 50,000 Units by mouth once a week.    . Vitamin D, Ergocalciferol, (DRISDOL) 50000 units CAPS capsule Take 1 capsule (50,000 Units total)  by mouth every 7 (seven) days. 4 capsule 0   No current facility-administered medications on file prior to visit.     PAST MEDICAL HISTORY: Past Medical History:  Diagnosis Date  . Anemia   . Asthma    as child  . Back pain   . GERD (gastroesophageal reflux disease)   . Headache   . Hypertension   . Joint pain   . Migraines   . Parathyroid abnormality ([place])   . Swelling    feet and legs  . Vitamin D deficiency     PAST SURGICAL HISTORY: Past Surgical History:  Procedure Laterality Date  . FOOT SURGERY Bilateral 2001  . PARATHYROIDECTOMY N/A 01/03/2016   Procedure: PARATHYROIDECTOMY;  Surgeon: Leta Baptist, MD;  Location: [place];  Service: ENT;  Laterality: N/A;  . TUBAL LIGATION      SOCIAL HISTORY: Social History   Tobacco Use  . Smoking status: Never Smoker  . Smokeless tobacco: Never Used  Substance Use Topics  . Alcohol use: No  . Drug use: No    FAMILY HISTORY: Family History  Problem Relation Age of Onset  . Hypertension Father   . Cancer Maternal Grandmother   . Heart disease Paternal Grandmother   . Cancer Paternal Grandmother     ROS: Review of Systems  Constitutional: Negative for weight loss.  Respiratory: Negative for shortness of breath.   Cardiovascular:  Negative for chest pain.  Gastrointestinal: Negative for nausea and vomiting.  Musculoskeletal:       Negative for muscle weakness.  Neurological: Positive for headaches.    PHYSICAL EXAM: Blood pressure 128/83, pulse 68, temperature 98.1 F (36.7 C), temperature source Oral, height 5\' 6"  (1.676 m), weight 181 lb (82.1 kg), SpO2 100 %. Body mass index is 29.21 kg/m. Physical Exam  Constitutional: She is oriented to person, place, and time. She appears well-developed and well-nourished.  Cardiovascular: Normal rate.  Pulmonary/Chest: Effort normal.  Musculoskeletal: Normal range of motion.  Neurological: She is oriented to person, place, and time.  Skin: Skin is warm  and dry.  Psychiatric: She has a normal mood and affect. Her behavior is normal.  Vitals reviewed.   RECENT LABS AND TESTS: BMET    Component Value Date/Time   NA 140 03/02/2018 0746   K 4.6 03/02/2018 0746   CL 103 03/02/2018 0746   CO2 21 03/02/2018 0746   GLUCOSE 95 03/02/2018 0746   BUN 27 (H) 03/02/2018 0746   CREATININE 0.95 03/02/2018 0746   CALCIUM 9.2 03/02/2018 0746   CALCIUM 7.5 (L) 01/03/2016 1520   GFRNONAA 72 03/02/2018 0746   GFRAA 82 03/02/2018 0746   Lab Results  Component Value Date   HGBA1C 5.5 03/02/2018   HGBA1C 5.5 09/01/2017   HGBA1C 5.6 11/10/2016   HGBA1C 5.5 08/13/2016   Lab Results  Component Value Date   INSULIN 5.8 03/02/2018   INSULIN 3.7 09/01/2017   INSULIN 5.6 11/10/2016   INSULIN 7.9 08/13/2016   CBC    Component Value Date/Time   WBC 4.3 03/02/2018 0746   RBC 4.41 03/02/2018 0746   HGB 12.7 03/02/2018 0746   HCT 38.4 03/02/2018 0746   MCV 87 03/02/2018 0746   MCH 28.8 03/02/2018 0746   MCHC 33.1 03/02/2018 0746   RDW 12.5 03/02/2018 0746   LYMPHSABS 1.8 03/02/2018 0746   EOSABS 0.1 03/02/2018 0746   BASOSABS 0.0 03/02/2018 0746   Iron/TIBC/Ferritin/ %Sat No results found for: IRON, TIBC, FERRITIN, IRONPCTSAT Lipid Panel     Component Value Date/Time   CHOL 206 (H) 03/02/2018 0746   TRIG 56 03/02/2018 0746   HDL 67 03/02/2018 0746   LDLCALC 128 (H) 03/02/2018 0746   Hepatic Function Panel     Component Value Date/Time   PROT 6.9 03/02/2018 0746   ALBUMIN 4.3 03/02/2018 0746   AST 23 03/02/2018 0746   ALT 20 03/02/2018 0746   ALKPHOS 49 03/02/2018 0746   BILITOT 0.3 03/02/2018 0746      Component Value Date/Time   TSH 1.870 03/02/2018 0746   TSH 2.080 08/13/2016 0928   Results for Chriswell, Kellie R (MRN [id number]) as of 05/11/2018 12:15  Ref. Range 03/02/2018 07:46  Vitamin D, 25-Hydroxy Latest Ref Range: 30.0 - 100.0 ng/mL 43.7   ASSESSMENT AND PLAN: Essential hypertension  Vitamin D deficiency  Overweight  with body mass index (BMI) 25.0-29.9 - Starting BMI greater then 30  PLAN:  Hypertension We discussed sodium restriction, working on healthy weight loss, and a regular exercise program as the means to achieve improved blood pressure control. We will continue to monitor her blood pressure as well as her progress with the above lifestyle modifications. She will continue her Norvasc 10mg  (no refill needed) and will watch for signs of hypotension as she continues her lifestyle modifications. [name] agreed with this plan and agreed to follow up as directed in 3 weeks.  Vitamin D Deficiency [name]  was informed that low vitamin D levels contributes to fatigue and are associated with obesity, breast, and colon cancer. She agrees to continue to take prescription Vit D @50 ,000 IU every week (no prescription needed) and will follow up for routine testing of vitamin D, at least 2-3 times per year. She was informed of the risk of over-replacement of vitamin D and agrees to not increase her dose unless she discusses this with [name] first. [name] agrees to follow up as directed.  I spent > than 50% of the 15 minute visit on counseling as documented in the note.  Overweight [name] is currently in the action stage of change. As such, her goal is to continue with weight loss efforts. She has agreed to keep a food journal with 1200 calories and 85 grams of protein daily. Ronnita has been instructed to continue strength training 3 to 4 times a week. We discussed the following Behavioral Modification Strategies today: planning for success and keep a strict food journal.  Kemani has agreed to follow up with our clinic in 3 weeks. She was informed of the importance of frequent follow up visits to maximize her success with intensive lifestyle modifications for her multiple health conditions.   OBESITY BEHAVIORAL INTERVENTION VISIT  Today's visit was # 34  Starting weight: 188 lbs Starting date: 08/13/16 Today's weight : Weight:  181 lb (82.1 kg)  Today's date: 05/11/2018 Total lbs lost to date: 7  ASK: We discussed the diagnosis of obesity with [name] today and Kymberley agreed to give [name] permission to discuss obesity behavioral modification therapy today.  ASSESS: Jadalynn has the diagnosis of obesity and her BMI today is 29.23. Kasi is in the action stage of change.   ADVISE: Kailiana was educated on the multiple health risks of obesity as well as the benefit of weight loss to improve her health. She was advised of the need for long term treatment and the importance of lifestyle modifications to improve her current health and to decrease her risk of future health problems.  AGREE: Multiple dietary modification options and treatment options were discussed and Mairyn agreed to follow the recommendations documented in the above note.  ARRANGE: Mitzi was educated on the importance of frequent visits to treat obesity as outlined per CMS and USPSTF guidelines and agreed to schedule her next follow up appointment today.  Lenward Chancellor, am acting as Location manager for Georgianne Fick, FNP.  I have reviewed the above documentation for accuracy and completeness, and I agree with the above.  -  , FNP-C.

## 2018-05-31 DIAGNOSIS — Z1239 Encounter for other screening for malignant neoplasm of breast: Secondary | ICD-10-CM | POA: Diagnosis not present

## 2018-05-31 DIAGNOSIS — N814 Uterovaginal prolapse, unspecified: Secondary | ICD-10-CM | POA: Diagnosis not present

## 2018-05-31 DIAGNOSIS — Z01419 Encounter for gynecological examination (general) (routine) without abnormal findings: Secondary | ICD-10-CM | POA: Diagnosis not present

## 2018-05-31 DIAGNOSIS — N812 Incomplete uterovaginal prolapse: Secondary | ICD-10-CM | POA: Diagnosis not present

## 2018-05-31 DIAGNOSIS — N926 Irregular menstruation, unspecified: Secondary | ICD-10-CM | POA: Diagnosis not present

## 2018-06-01 ENCOUNTER — Ambulatory Visit (INDEPENDENT_AMBULATORY_CARE_PROVIDER_SITE_OTHER): Payer: 59 | Admitting: Family Medicine

## 2018-06-01 VITALS — BP 123/87 | HR 70 | Temp 97.9°F | Ht 66.0 in | Wt 183.0 lb

## 2018-06-01 DIAGNOSIS — E669 Obesity, unspecified: Secondary | ICD-10-CM

## 2018-06-01 DIAGNOSIS — Z6830 Body mass index (BMI) 30.0-30.9, adult: Secondary | ICD-10-CM

## 2018-06-01 DIAGNOSIS — Z9189 Other specified personal risk factors, not elsewhere classified: Secondary | ICD-10-CM | POA: Diagnosis not present

## 2018-06-01 DIAGNOSIS — E559 Vitamin D deficiency, unspecified: Secondary | ICD-10-CM | POA: Diagnosis not present

## 2018-06-01 DIAGNOSIS — I10 Essential (primary) hypertension: Secondary | ICD-10-CM | POA: Diagnosis not present

## 2018-06-01 MED ORDER — VITAMIN D (ERGOCALCIFEROL) 1.25 MG (50000 UNIT) PO CAPS: 50000.0000 [IU] | ORAL_CAPSULE | ORAL | 0 refills | Status: DC

## 2018-06-01 MED FILL — VIT D2 1.25 MG (50,000 UNIT: 1.25 MG | 28 days supply | Qty: 4 | Fill #0

## 2018-06-06 DIAGNOSIS — J329 Chronic sinusitis, unspecified: Secondary | ICD-10-CM | POA: Diagnosis not present

## 2018-06-07 ENCOUNTER — Encounter: Payer: Self-pay | Admitting: Gastroenterology

## 2018-06-08 ENCOUNTER — Ambulatory Visit: Payer: 59 | Admitting: Gastroenterology

## 2018-06-08 ENCOUNTER — Encounter (INDEPENDENT_AMBULATORY_CARE_PROVIDER_SITE_OTHER): Payer: Self-pay | Admitting: Family Medicine

## 2018-06-08 ENCOUNTER — Encounter: Payer: Self-pay | Admitting: Gastroenterology

## 2018-06-08 VITALS — BP 140/90 | HR 77 | Ht 66.0 in | Wt 190.1 lb

## 2018-06-08 DIAGNOSIS — R1031 Right lower quadrant pain: Secondary | ICD-10-CM

## 2018-06-08 DIAGNOSIS — R1013 Epigastric pain: Secondary | ICD-10-CM

## 2018-06-08 MED ORDER — SOD PICOSULFATE-MAG OX-CIT ACD 10-3.5-12 MG-GM -GM/160ML PO SOLN: 1.0000 | Freq: Once | ORAL | 0 refills | Status: AC

## 2018-06-08 MED ORDER — OMEPRAZOLE 20 MG PO CPDR: 20.0000 mg | DELAYED_RELEASE_CAPSULE | Freq: Every day | ORAL | 11 refills | Status: DC

## 2018-06-08 MED FILL — OMEPRAZOLE 20 MG CPDR: 20 | 30 days supply | Qty: 30 | Fill #0

## 2018-06-08 MED FILL — CLENPIQ 10-3.5-12 MG-GM -GM: 10-3.5-12 M | 1 days supply | Qty: 320 | Fill #0

## 2018-06-08 NOTE — Progress Notes (Signed)
Office: [phone]  /  Fax: [phone]   HPI:   Chief Complaint: OBESITY [name] is here to discuss her progress with her obesity treatment plan. She is keeping a food journal with 1300 calories and 85 grams of protein and is following her eating plan approximately 85 % of the time. She states she is doing strength training and cardio 60 minutes 3 times per week. [name] has indulged in extra holiday food. She reports her goal weight is 170 lbs. She had increased her water intake and she is exercising more.  Her weight is 183 lb (83 kg) today and has had a weight gain of 2 pounds over a period of 3 weeks since her last visit. She has lost 5 lbs since starting treatment with [name].  Vitamin D deficiency [name] has a diagnosis of vitamin D deficiency. She is currently taking vit D, but is not at goal. She denies nausea, vomiting, or muscle weakness.  At risk for osteopenia and osteoporosis [name] is at higher risk of osteopenia and osteoporosis due to vitamin D deficiency.   Hypertension [name] is a 47 y.o. female with hypertension. She is working on weight loss to help control her blood pressure with the goal of decreasing her risk of heart attack and stroke. [name]'s blood pressure is much better controlled with Norvasc 10 mg rather than 5 mg. [name] denies chest pain, shortness of breath, or headaches.   ALLERGIES: No Known Allergies  MEDICATIONS: Current Outpatient Medications on File Prior to Visit  Medication Sig Dispense Refill  . amLODipine (NORVASC) 5 MG tablet Take 5 mg by mouth daily.    Marland Kitchen ibuprofen (ADVIL,MOTRIN) 400 MG tablet Take 1 tablet (400 mg total) by mouth 3 (three) times daily. 30 tablet 0  . SUMAtriptan (IMITREX) 50 MG tablet Take 50 mg by mouth every 2 (two) hours as needed for migraine. May repeat in 2 hours if headache persists or recurs.    . vitamin A 10000 UNIT capsule Take 50,000 Units by mouth once a week.     No current facility-administered medications on file  prior to visit.     PAST MEDICAL HISTORY: Past Medical History:  Diagnosis Date  . Anemia   . Asthma    as child  . Back pain   . GERD (gastroesophageal reflux disease)   . Headache   . Hypertension   . Joint pain   . Migraines   . Parathyroid abnormality ([place])   . Swelling    feet and legs  . Vitamin D deficiency     PAST SURGICAL HISTORY: Past Surgical History:  Procedure Laterality Date  . ESOPHAGOGASTRODUODENOSCOPY  09/13/2003   Normal EGD.   Marland Kitchen FOOT SURGERY Bilateral 2001  . PARATHYROIDECTOMY N/A 01/03/2016   Procedure: PARATHYROIDECTOMY;  Surgeon: Leta Baptist, MD;  Location: [facility];  Service: ENT;  Laterality: N/A;  . TUBAL LIGATION      SOCIAL HISTORY: Social History   Tobacco Use  . Smoking status: Never Smoker  . Smokeless tobacco: Never Used  Substance Use Topics  . Alcohol use: No  . Drug use: No    FAMILY HISTORY: Family History  Problem Relation Age of Onset  . Hypertension Father   . Cancer Maternal Grandmother   . Heart disease Paternal Grandmother   . Cancer Paternal Grandmother     ROS: Review of Systems  Constitutional: Negative for weight loss.  Respiratory: Negative for shortness of breath.   Cardiovascular: Negative for chest  pain.  Gastrointestinal: Negative for nausea and vomiting.  Musculoskeletal:       Negative for muscle weakness.  Neurological: Negative for headaches.    PHYSICAL EXAM: Blood pressure 123/87, pulse 70, temperature 97.9 F (36.6 C), temperature source Oral, height 5\' 6"  (1.676 m), weight 183 lb (83 kg), SpO2 97 %. Body mass index is 29.54 kg/m. Physical Exam  Constitutional: She is oriented to person, place, and time. She appears well-developed and well-nourished.  Cardiovascular: Normal rate.  Pulmonary/Chest: Effort normal.  Musculoskeletal: Normal range of motion.  Neurological: She is oriented to person, place, and time.  Skin: Skin is warm and dry.  Psychiatric: She has a normal  mood and affect. Her behavior is normal.  Vitals reviewed.   RECENT LABS AND TESTS: BMET    Component Value Date/Time   NA 140 03/02/2018 0746   K 4.6 03/02/2018 0746   CL 103 03/02/2018 0746   CO2 21 03/02/2018 0746   GLUCOSE 95 03/02/2018 0746   BUN 27 (H) 03/02/2018 0746   CREATININE 0.95 03/02/2018 0746   CALCIUM 9.2 03/02/2018 0746   CALCIUM 7.5 (L) 01/03/2016 1520   GFRNONAA 72 03/02/2018 0746   GFRAA 82 03/02/2018 0746   Lab Results  Component Value Date   HGBA1C 5.5 03/02/2018   HGBA1C 5.5 09/01/2017   HGBA1C 5.6 11/10/2016   HGBA1C 5.5 08/13/2016   Lab Results  Component Value Date   INSULIN 5.8 03/02/2018   INSULIN 3.7 09/01/2017   INSULIN 5.6 11/10/2016   INSULIN 7.9 08/13/2016   CBC    Component Value Date/Time   WBC 4.3 03/02/2018 0746   RBC 4.41 03/02/2018 0746   HGB 12.7 03/02/2018 0746   HCT 38.4 03/02/2018 0746   MCV 87 03/02/2018 0746   MCH 28.8 03/02/2018 0746   MCHC 33.1 03/02/2018 0746   RDW 12.5 03/02/2018 0746   LYMPHSABS 1.8 03/02/2018 0746   EOSABS 0.1 03/02/2018 0746   BASOSABS 0.0 03/02/2018 0746   Iron/TIBC/Ferritin/ %Sat No results found for: IRON, TIBC, FERRITIN, IRONPCTSAT Lipid Panel     Component Value Date/Time   CHOL 206 (H) 03/02/2018 0746   TRIG 56 03/02/2018 0746   HDL 67 03/02/2018 0746   LDLCALC 128 (H) 03/02/2018 0746   Hepatic Function Panel     Component Value Date/Time   PROT 6.9 03/02/2018 0746   ALBUMIN 4.3 03/02/2018 0746   AST 23 03/02/2018 0746   ALT 20 03/02/2018 0746   ALKPHOS 49 03/02/2018 0746   BILITOT 0.3 03/02/2018 0746      Component Value Date/Time   TSH 1.870 03/02/2018 0746   TSH 2.080 08/13/2016 0928   Results for Pollack, Murriel R (MRN [id number]) as of 06/08/2018 08:07  Ref. Range 03/02/2018 07:46  Vitamin D, 25-Hydroxy Latest Ref Range: 30.0 - 100.0 ng/mL 43.7   ASSESSMENT AND PLAN: Vitamin D deficiency - Plan: Vitamin D, Ergocalciferol, (DRISDOL) 1.25 MG (50000 UT) CAPS  capsule  Essential hypertension  At risk for osteoporosis  Class 1 obesity with serious comorbidity and body mass index (BMI) of 30.0 to 30.9 in adult, unspecified obesity type - BMI greater than 30 at start of program  PLAN:  Vitamin D Deficiency [name] was informed that low vitamin D levels contributes to fatigue and are associated with obesity, breast, and colon cancer. She agrees to continue to take prescription Vit D @50 ,000 IU weekly #4 with no refills and will follow up for routine testing of vitamin D, at least 2-3 times per  year. She was informed of the risk of over-replacement of vitamin D and agrees to not increase her dose unless she discusses this with [name] first. We will check her vitamin D level at her next visit and she agrees to follow up in 3 weeks.  At risk for osteopenia and osteoporosis [name] was given extended (15 minutes) osteoporosis prevention counseling today. [name] is at risk for osteopenia and osteoporosis due to her vitamin D deficiency. She was encouraged to take her vitamin D and follow her higher calcium diet and increase strengthening exercise to help strengthen her bones and decrease her risk of osteopenia and osteoporosis.  Hypertension We discussed sodium restriction, working on healthy weight loss, and a regular exercise program as the means to achieve improved blood pressure control. We will continue to monitor her blood pressure as well as her progress with the above lifestyle modifications. She will continue her Norvasc 10mg  and will watch for signs of hypotension as she continues her lifestyle modifications. [name] agreed with this plan and agreed to follow up as directed.  Obesity [name] is currently in the action stage of change. As such, her goal is to continue with weight loss efforts. She was given the "Smart Fruit" information handout. She has agreed to keep a food journal with 1300 calories and 85 grams of protein daily.  [name] has been instructed to  continue strength training and cardio 3 times a week. We discussed the following Behavioral Modification Strategies today: increase H2O intake, holiday eating strategies, and planning for success.   [name] has agreed to follow up with our clinic in 3 weeks. She was informed of the importance of frequent follow up visits to maximize her success with intensive lifestyle modifications for her multiple health conditions.   OBESITY BEHAVIORAL INTERVENTION VISIT  Today's visit was # 35   Starting weight: 188 lbs Starting date: 08/13/16 Today's weight : Weight: 183 lb (83 kg)  Today's date: 06/01/2018 Total lbs lost to date: 5  ASK: We discussed the diagnosis of obesity with [name] today and [name] agreed to give [name] permission to discuss obesity behavioral modification therapy today.  ASSESS: [name] has the diagnosis of obesity and her BMI today is 29.55. [name] is in the action stage of change.  ADVISE: [name] was educated on the multiple health risks of obesity as well as the benefit of weight loss to improve her health. She was advised of the need for long term treatment and the importance of lifestyle modifications to improve her current health and to decrease her risk of future health problems.  AGREE: Multiple dietary modification options and treatment options were discussed and [name] agreed to follow the recommendations documented in the above note.  ARRANGE: [name] was educated on the importance of frequent visits to treat obesity as outlined per CMS and USPSTF guidelines and agreed to schedule her next follow up appointment today.  I, Marcille Blanco, am acting as Location manager for [employer], FNP-C.  I have reviewed the above documentation for accuracy and completeness, and I agree with the above.  - Jaevin Medearis, FNP-C.
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
## 2018-06-08 NOTE — Patient Instructions (Addendum)
If you are age 48 or older, your body mass index should be between 23-30. Your Body mass index is 30.69 kg/m. If this is out of the aforementioned range listed, please consider follow up with your Primary Care Provider.  If you are age 56 or younger, your body mass index should be between 19-25. Your Body mass index is 30.69 kg/m. If this is out of the aformentioned range listed, please consider follow up with your Primary Care Provider.   You have been scheduled for an endoscopy and colonoscopy. Please follow the written instructions given to you at your visit today. Please pick up your prep supplies at the pharmacy within the next 1-3 days. If you use inhalers (even only as needed), please bring them with you on the day of your procedure. Your physician has requested that you go to www.startemmi.com and enter the access code given to you at your visit today. This web site gives a general overview about your procedure. However, you should still follow specific instructions given to you by our office regarding your preparation for the procedure.  We have sent the following medications to your pharmacy for you to pick up at your convenience: Omeprazole 20 mg once daily Clenpiq  When you have your blood work done at your primary care doctors office please have them add the following: Lipase  Thank you,  Dr. Jackquline Denmark

## 2018-06-21 DIAGNOSIS — Z466 Encounter for fitting and adjustment of urinary device: Secondary | ICD-10-CM | POA: Diagnosis not present

## 2018-06-21 DIAGNOSIS — N812 Incomplete uterovaginal prolapse: Secondary | ICD-10-CM

## 2018-06-21 HISTORY — DX: Incomplete uterovaginal prolapse: N81.2

## 2018-06-22 ENCOUNTER — Ambulatory Visit (INDEPENDENT_AMBULATORY_CARE_PROVIDER_SITE_OTHER): Payer: 59 | Admitting: Family Medicine

## 2018-06-22 ENCOUNTER — Encounter (INDEPENDENT_AMBULATORY_CARE_PROVIDER_SITE_OTHER): Payer: Self-pay

## 2018-06-24 MED FILL — AMLODIPINE BESYLATE 10 MG T: 10 | 90 days supply | Qty: 90 | Fill #0

## 2018-07-02 DIAGNOSIS — R21 Rash and other nonspecific skin eruption: Secondary | ICD-10-CM | POA: Diagnosis not present

## 2018-07-02 DIAGNOSIS — L03316 Cellulitis of umbilicus: Secondary | ICD-10-CM | POA: Insufficient documentation

## 2018-07-02 HISTORY — DX: Cellulitis of umbilicus: L03.316

## 2018-07-07 ENCOUNTER — Encounter: Payer: Self-pay | Admitting: Gastroenterology

## 2018-07-13 ENCOUNTER — Encounter (INDEPENDENT_AMBULATORY_CARE_PROVIDER_SITE_OTHER): Payer: Self-pay | Admitting: Family Medicine

## 2018-07-13 ENCOUNTER — Ambulatory Visit (INDEPENDENT_AMBULATORY_CARE_PROVIDER_SITE_OTHER): Payer: 59 | Admitting: Family Medicine

## 2018-07-13 VITALS — BP 111/77 | HR 65 | Temp 98.3°F | Ht 66.0 in | Wt 187.0 lb

## 2018-07-13 DIAGNOSIS — R1013 Epigastric pain: Secondary | ICD-10-CM

## 2018-07-13 DIAGNOSIS — Z9189 Other specified personal risk factors, not elsewhere classified: Secondary | ICD-10-CM | POA: Diagnosis not present

## 2018-07-13 DIAGNOSIS — E7849 Other hyperlipidemia: Secondary | ICD-10-CM | POA: Diagnosis not present

## 2018-07-13 DIAGNOSIS — Z6830 Body mass index (BMI) 30.0-30.9, adult: Secondary | ICD-10-CM

## 2018-07-13 DIAGNOSIS — E669 Obesity, unspecified: Secondary | ICD-10-CM

## 2018-07-13 DIAGNOSIS — E559 Vitamin D deficiency, unspecified: Secondary | ICD-10-CM

## 2018-07-13 MED ORDER — VITAMIN D (ERGOCALCIFEROL) 1.25 MG (50000 UNIT) PO CAPS: 50000.0000 [IU] | ORAL_CAPSULE | ORAL | 0 refills | Status: DC

## 2018-07-13 MED FILL — VIT D2 1.25 MG (50,000 UNIT: 1.25 MG | 28 days supply | Qty: 4 | Fill #0

## 2018-07-14 ENCOUNTER — Encounter (INDEPENDENT_AMBULATORY_CARE_PROVIDER_SITE_OTHER): Payer: Self-pay | Admitting: Family Medicine

## 2018-07-14 LAB — LIPID PANEL WITH LDL/HDL RATIO
Cholesterol, Total: 212 mg/dL — ABNORMAL HIGH (ref 100–199)
HDL: 75 mg/dL (ref >39)
LDL Calculated: 127 mg/dL — ABNORMAL HIGH (ref 0–99)
LDl/HDL Ratio: 1.7 ratio (ref 0.0–3.2)
Triglycerides: 50 mg/dL (ref 0–149)
VLDL Cholesterol Cal: 10 mg/dL (ref 5–40)

## 2018-07-14 LAB — COMPREHENSIVE METABOLIC PANEL
ALT: 22 IU/L (ref 0–32)
AST: 15 IU/L (ref 0–40)
Albumin/Globulin Ratio: 1.7 (ref 1.2–2.2)
Albumin: 4.4 g/dL (ref 3.5–5.5)
Alkaline Phosphatase: 58 IU/L (ref 39–117)
BUN/Creatinine Ratio: 19 (ref 9–23)
BUN: 17 mg/dL (ref 6–24)
Bilirubin Total: 0.4 mg/dL (ref 0.0–1.2)
CO2: 22 mmol/L (ref 20–29)
Calcium: 9.1 mg/dL (ref 8.7–10.2)
Chloride: 103 mmol/L (ref 96–106)
Creatinine, Ser: 0.89 mg/dL (ref 0.57–1.00)
GFR calc Af Amer: 89 mL/min/{1.73_m2} (ref >59)
GFR calc non Af Amer: 77 mL/min/{1.73_m2} (ref >59)
Globulin, Total: 2.6 g/dL (ref 1.5–4.5)
Glucose: 88 mg/dL (ref 65–99)
Potassium: 4.4 mmol/L (ref 3.5–5.2)
Sodium: 141 mmol/L (ref 134–144)
Total Protein: 7 g/dL (ref 6.0–8.5)

## 2018-07-14 LAB — VITAMIN D 25 HYDROXY (VIT D DEFICIENCY, FRACTURES): Vit D, 25-Hydroxy: 37.1 ng/mL (ref 30.0–100.0)

## 2018-07-14 LAB — LIPASE: Lipase: 59 U/L (ref 14–72)

## 2018-07-14 LAB — AMYLASE: Amylase: 70 U/L (ref 31–124)

## 2018-07-14 MED FILL — OMEPRAZOLE 20 MG CPDR: 20 | 30 days supply | Qty: 30 | Fill #1

## 2018-07-14 NOTE — Progress Notes (Signed)
Office: [phone]  /  Fax: [phone]   HPI:   Chief Complaint: OBESITY [name] is here to discuss her progress with her obesity treatment plan. She is on the  keep a food journal with 1300 calories and 85 grams of protein and is following her eating plan approximately 0 % of the time. She states she is exercising 0 minutes 0 times per week. [name] is meeting her protein goal. Her weight goal is 170 lbs which is a BMI of 27.  Her weight is 187 lb (84.8 kg) today and has had a weight gain of 4 pounds over a period of 5 weeks since her last visit. She has lost 1 lb since starting treatment with [name].  Vitamin D deficiency [name] has a diagnosis of vitamin D deficiency. She is currently taking prescription vit D and is not at goal. Her last vitamin D level is 43.7 on 03/02/18. She denies nausea, vomiting, or muscle weakness.  Hyperlipidemia [name] has hyperlipidemia and has been trying to improve her cholesterol levels with intensive lifestyle modification including a low saturated fat diet, exercise and weight loss. She is on a statin and she denies any chest pain or shortness of breath.  The 10-year ASCVD risk score [name] DC Brooke Bonito., et al., 2013) is: 0.7%   Values used to calculate the score:     Age: 48 years     Sex: Female     Is Non-Hispanic African American: No     Diabetic: No     Tobacco smoker: No     Systolic Blood Pressure: 102 mmHg     Is BP treated: Yes     HDL Cholesterol: 75 mg/dL     Total Cholesterol: 212 mg/dL   At risk for cardiovascular disease [name] is at a higher than average risk for cardiovascular disease due to pre-diabetes and obesity. She currently denies any chest pain.  Epigastric Pain [name] is having epigastric pain. Her GI doctor requests that we check her lipase. She has an endoscopy and colonoscopy scheduled next week. She admits to nausea and mild constipation. [name] denies heartburn or blood in her stool.  ASSESSMENT AND PLAN:  Vitamin D deficiency - Plan:  VITAMIN D 25 Hydroxy (Vit-D Deficiency, Fractures), Vitamin D, Ergocalciferol, (DRISDOL) 1.25 MG (50000 UT) CAPS capsule  Other hyperlipidemia - Plan: Comprehensive metabolic panel, Lipid Panel With LDL/HDL Ratio  Epigastric pain - Plan: Comprehensive metabolic panel, Amylase, Lipase  At risk for heart disease  Class 1 obesity with serious comorbidity and body mass index (BMI) of 30.0 to 30.9 in adult, unspecified obesity type  PLAN:  Vitamin D Deficiency [name] was informed that low vitamin D levels contributes to fatigue and are associated with obesity, breast, and colon cancer. She agrees to continue to take prescription Vit D @50 ,000 IU every week #4 with no refills and will follow up for routine testing of vitamin D, at least 2-3 times per year. She was informed of the risk of over-replacement of vitamin D and agrees to not increase her dose unless she discusses this with [name] first. We will check her vitamin D level today. [name] agrees to follow up in 3 weeks.  Hyperlipidemia [name] was informed of the American Heart Association Guidelines emphasizing intensive lifestyle modifications as the first line treatment for hyperlipidemia. We discussed many lifestyle modifications today in depth, and [name] will continue to work on decreasing saturated fats such as fatty red meat, butter and many fried foods. She will also increase vegetables  and lean protein in her diet and continue to work on exercise and weight loss efforts. We will order a FLP today and [name] will follow up at the agreed upon time.  Cardiovascular risk counseling [name] was given extended (15 minutes) coronary artery disease prevention counseling today. She is 48 y.o. female and has risk factors for heart disease including diabetes and obesity. We discussed intensive lifestyle modifications today with an emphasis on specific weight loss instructions and strategies. Pt was also informed of the importance of increasing exercise and decreasing  saturated fats to help prevent heart disease.  Epigastric Pain We will order a CMET, amylase, and lipase today on [name]. She agrees to follow up as directed in 3 weeks.  Obesity [name] is currently in the action stage of change. As such, her goal is to continue with weight loss efforts. She has agreed to keep a food journal with 1300 calories and 85+ grams of protein.  [name] has been instructed to resume exercise after her endoscopy. [name] has agreed to follow up with our clinic in 3 weeks. She was informed of the importance of frequent follow up visits to maximize her success with intensive lifestyle modifications for her multiple health conditions.  ALLERGIES: No Known Allergies  MEDICATIONS: Current Outpatient Medications on File Prior to Visit  Medication Sig Dispense Refill  . amLODipine (NORVASC) 5 MG tablet Take 5 mg by mouth daily.    Marland Kitchen ibuprofen (ADVIL,MOTRIN) 400 MG tablet Take 1 tablet (400 mg total) by mouth 3 (three) times daily. 30 tablet 0  . omeprazole (PRILOSEC) 20 MG capsule Take 1 capsule (20 mg total) by mouth daily. 30 capsule 11  . SUMAtriptan (IMITREX) 50 MG tablet Take 50 mg by mouth every 2 (two) hours as needed for migraine. May repeat in 2 hours if headache persists or recurs.     No current facility-administered medications on file prior to visit.     PAST MEDICAL HISTORY: Past Medical History:  Diagnosis Date  . Anemia   . Asthma    as child  . Back pain   . GERD (gastroesophageal reflux disease)   . Headache   . Hypertension   . Joint pain   . Migraines   . Parathyroid abnormality ([name])   . Swelling    feet and legs  . Vitamin D deficiency     PAST SURGICAL HISTORY: Past Surgical History:  Procedure Laterality Date  . ESOPHAGOGASTRODUODENOSCOPY  09/13/2003   Normal EGD.   Marland Kitchen FOOT SURGERY Bilateral 2001  . PARATHYROIDECTOMY N/A 01/03/2016   Procedure: PARATHYROIDECTOMY;  Surgeon: Leta Baptist, MD;  Location: [facility];  Service: ENT;   Laterality: N/A;  . TUBAL LIGATION      SOCIAL HISTORY: Social History   Tobacco Use  . Smoking status: Never Smoker  . Smokeless tobacco: Never Used  Substance Use Topics  . Alcohol use: No  . Drug use: No    FAMILY HISTORY: Family History  Problem Relation Age of Onset  . Hypertension Father   . Cancer Maternal Grandmother   . Heart disease Paternal Grandmother   . Cancer Paternal Grandmother   . Diabetes Paternal Grandmother   . Melanoma Maternal Grandfather   . Colon cancer Neg Hx     ROS: Review of Systems  Constitutional: Negative for weight loss.  Respiratory: Negative for shortness of breath.   Cardiovascular: Negative for chest pain.  Gastrointestinal: Positive for constipation and nausea. Negative for blood in stool, heartburn and vomiting.  Positive for epigastric pain.  Musculoskeletal:       Negative for muscle weakness.    PHYSICAL EXAM: Blood pressure 111/77, pulse 65, temperature 98.3 F (36.8 C), temperature source Oral, height 5\' 6"  (1.676 m), weight 187 lb (84.8 kg), SpO2 97 %. Body mass index is 30.18 kg/m. Physical Exam Vitals signs reviewed.  Constitutional:      Appearance: Normal appearance. She is obese.  Cardiovascular:     Rate and Rhythm: Normal rate.  Pulmonary:     Effort: Pulmonary effort is normal.  Musculoskeletal: Normal range of motion.  Skin:    General: Skin is warm and dry.  Neurological:     Mental Status: She is alert and oriented to person, place, and time.  Psychiatric:        Mood and Affect: Mood normal.        Behavior: Behavior normal.     RECENT LABS AND TESTS: BMET    Component Value Date/Time   NA 141 07/13/2018 1006   K 4.4 07/13/2018 1006   CL 103 07/13/2018 1006   CO2 22 07/13/2018 1006   GLUCOSE 88 07/13/2018 1006   BUN 17 07/13/2018 1006   CREATININE 0.89 07/13/2018 1006   CALCIUM 9.1 07/13/2018 1006   CALCIUM 7.5 (L) 01/03/2016 1520   GFRNONAA 77 07/13/2018 1006   GFRAA 89  07/13/2018 1006   Lab Results  Component Value Date   HGBA1C 5.5 03/02/2018   HGBA1C 5.5 09/01/2017   HGBA1C 5.6 11/10/2016   HGBA1C 5.5 08/13/2016   Lab Results  Component Value Date   INSULIN 5.8 03/02/2018   INSULIN 3.7 09/01/2017   INSULIN 5.6 11/10/2016   INSULIN 7.9 08/13/2016   CBC    Component Value Date/Time   WBC 4.3 03/02/2018 0746   RBC 4.41 03/02/2018 0746   HGB 12.7 03/02/2018 0746   HCT 38.4 03/02/2018 0746   MCV 87 03/02/2018 0746   MCH 28.8 03/02/2018 0746   MCHC 33.1 03/02/2018 0746   RDW 12.5 03/02/2018 0746   LYMPHSABS 1.8 03/02/2018 0746   EOSABS 0.1 03/02/2018 0746   BASOSABS 0.0 03/02/2018 0746   Iron/TIBC/Ferritin/ %Sat No results found for: IRON, TIBC, FERRITIN, IRONPCTSAT Lipid Panel     Component Value Date/Time   CHOL 212 (H) 07/13/2018 1006   TRIG 50 07/13/2018 1006   HDL 75 07/13/2018 1006   LDLCALC 127 (H) 07/13/2018 1006   Hepatic Function Panel     Component Value Date/Time   PROT 7.0 07/13/2018 1006   ALBUMIN 4.4 07/13/2018 1006   AST 15 07/13/2018 1006   ALT 22 07/13/2018 1006   ALKPHOS 58 07/13/2018 1006   BILITOT 0.4 07/13/2018 1006      Component Value Date/Time   TSH 1.870 03/02/2018 0746   TSH 2.080 08/13/2016 0928   Results for Ranieri, [name] (MRN [id number]) as of 07/14/2018 06:25  Ref. Range 03/02/2018 07:46  Vitamin D, 25-Hydroxy Latest Ref Range: 30.0 - 100.0 ng/mL 43.7    OBESITY BEHAVIORAL INTERVENTION VISIT  Today's visit was # 36  Starting weight: 188 lbs Starting date: 08/13/16 Today's weight : Weight: 187 lb (84.8 kg)  Today's date: 07/13/2018 Total lbs lost to date: 1  ASK: We discussed the diagnosis of obesity with [name] today and Achsah agreed to give [name] permission to discuss obesity behavioral modification therapy today.  ASSESS: Jaleiah has the diagnosis of obesity and her BMI today is 30.2. Roxi is in the action stage of change.  ADVISE: Aerionna was educated on the multiple health risks of  obesity as well as the benefit of weight loss to improve her health. She was advised of the need for long term treatment and the importance of lifestyle modifications to improve her current health and to decrease her risk of future health problems.  AGREE: Multiple dietary modification options and treatment options were discussed and Cherylee agreed to follow the recommendations documented in the above note.  ARRANGE: Linde was educated on the importance of frequent visits to treat obesity as outlined per CMS and USPSTF guidelines and agreed to schedule her next follow up appointment today.  I, Marcille Blanco, am acting as Location manager for [employer], FNP-C.  I have reviewed the above documentation for accuracy and completeness, and I agree with the above.  - Maja Mccaffery, FNP-C.

## 2018-07-20 ENCOUNTER — Encounter: Payer: Self-pay | Admitting: Gastroenterology

## 2018-07-20 ENCOUNTER — Ambulatory Visit (AMBULATORY_SURGERY_CENTER): Payer: 59 | Admitting: Gastroenterology

## 2018-07-20 VITALS — BP 136/78 | HR 63 | Temp 97.5°F | Resp 12 | Ht 66.0 in | Wt 187.0 lb

## 2018-07-20 DIAGNOSIS — R1031 Right lower quadrant pain: Secondary | ICD-10-CM | POA: Diagnosis not present

## 2018-07-20 DIAGNOSIS — E7800 Pure hypercholesterolemia, unspecified: Secondary | ICD-10-CM | POA: Diagnosis not present

## 2018-07-20 DIAGNOSIS — I10 Essential (primary) hypertension: Secondary | ICD-10-CM | POA: Diagnosis not present

## 2018-07-20 DIAGNOSIS — R1013 Epigastric pain: Secondary | ICD-10-CM

## 2018-07-20 DIAGNOSIS — K2950 Unspecified chronic gastritis without bleeding: Secondary | ICD-10-CM

## 2018-07-20 MED ORDER — SODIUM CHLORIDE 0.9 % IV SOLN: 500.0000 mL | Freq: Once | INTRAVENOUS | Status: DC

## 2018-07-20 NOTE — Patient Instructions (Addendum)
YOU HAD AN ENDOSCOPIC PROCEDURE TODAY AT [facility]:   Refer to the procedure report that was given to you for any specific questions about what was found during the examination.  If the procedure report does not answer your questions, please call your gastroenterologist to clarify.  If you requested that your care partner not be given the details of your procedure findings, then the procedure report has been included in a sealed envelope for you to review at your convenience later.  YOU SHOULD EXPECT: Some feelings of bloating in the abdomen. Passage of more gas than usual.  Walking can help get rid of the air that was put into your GI tract during the procedure and reduce the bloating. If you had a lower endoscopy (such as a colonoscopy or flexible sigmoidoscopy) you may notice spotting of blood in your stool or on the toilet paper. If you underwent a bowel prep for your procedure, you may not have a normal bowel movement for a few days.  Please Note:  You might notice some irritation and congestion in your nose or some drainage.  This is from the oxygen used during your procedure.  There is no need for concern and it should clear up in a day or so.  SYMPTOMS TO REPORT IMMEDIATELY:   Following lower endoscopy (colonoscopy or flexible sigmoidoscopy):  Excessive amounts of blood in the stool  Significant tenderness or worsening of abdominal pains  Swelling of the abdomen that is new, acute  Fever of 100F or higher   Following upper endoscopy (EGD)  Vomiting of blood or coffee ground material  New chest pain or pain under the shoulder blades  Painful or persistently difficult swallowing  New shortness of breath  Fever of 100F or higher  Black, tarry-looking stools  For urgent or emergent issues, a gastroenterologist can be reached at any hour by calling [phone]. Continue with your Omeprazole.  DIET:  We do recommend a small meal at first, but then you may proceed  to your regular diet.  Drink plenty of fluids but you should avoid alcoholic beverages for 24 hours.  ACTIVITY:  You should plan to take it easy for the rest of today and you should NOT DRIVE or use heavy machinery until tomorrow (because of the sedation medicines used during the test).    FOLLOW UP: Our staff will call the number listed on your records the next business day following your procedure to check on you and address any questions or concerns that you may have regarding the information given to you following your procedure. If we do not reach you, we will leave a message.  However, if you are feeling well and you are not experiencing any problems, there is no need to return our call.  We will assume that you have returned to your regular daily activities without incident.  If any biopsies were taken you will be contacted by phone or by letter within the next 1-3 weeks.  Please call us at [phone] if you have not heard about the biopsies in 3 weeks.   No ibuprofen until the Gastritis is cured.  SIGNATURES/CONFIDENTIALITY: You and/or your care partner have signed paperwork which will be entered into your electronic medical record.  These signatures attest to the fact that that the information above on your After Visit Summary has been reviewed and is understood.  Full responsibility of the confidentiality of this discharge information lies with you and/or your care-partner.  Read  all of the handouts given to you by your recovery room nurse.

## 2018-07-20 NOTE — Op Note (Addendum)
[facility] Patient Name: [name] Procedure Date: 07/20/2018 9:48 AM MRN: [id number] Endoscopist: Jackquline Denmark , MD Age: 48 Referring MD:  Date of Birth: [birth year] Gender: Female Account #: [id number] Procedure:                Upper GI endoscopy Indications:              Epigastric abdominal pain Medicines:                Monitored Anesthesia Care Procedure:                Pre-Anesthesia Assessment:                           - Prior to the procedure, a History and Physical                            was performed, and patient medications and                            allergies were reviewed. The patient's tolerance of                            previous anesthesia was also reviewed. The risks                            and benefits of the procedure and the sedation                            options and risks were discussed with the patient.                            All questions were answered, and informed consent                            was obtained. Prior Anticoagulants: The patient has                            taken no previous anticoagulant or antiplatelet                            agents. ASA Grade Assessment: II - A patient with                            mild systemic disease. After reviewing the risks                            and benefits, the patient was deemed in                            satisfactory condition to undergo the procedure.                           After obtaining informed consent, the endoscope was  passed under direct vision. Throughout the                            procedure, the patient's blood pressure, pulse, and                            oxygen saturations were monitored continuously. The                            Endoscope was introduced through the mouth, and                            advanced to the second part of duodenum. The upper                            GI endoscopy was accomplished without  difficulty.                            The patient tolerated the procedure well. Scope In: Scope Out: Findings:                 The Z-line was regular and was found 35 cm from the                            incisors. Esophagus was normal.                           Localized minimal inflammation characterized by                            erythema was found in the gastric antrum. Biopsies                            were taken with a cold forceps for histology.                            Estimated blood loss: none.                           The examined duodenum was normal. Biopsies for                            histology were taken with a cold forceps for                            evaluation of celiac disease. Estimated blood loss:                            none. Complications:            No immediate complications. Estimated Blood Loss:     Estimated blood loss: none. Impression:               - Mild gastritis. Biopsied.                           -  Otherwise normal EGD. Recommendation:           - Patient has a contact number available for                            emergencies. The signs and symptoms of potential                            delayed complications were discussed with the                            patient. Return to normal activities tomorrow.                            Written discharge instructions were provided to the                            patient.                           - Resume previous diet.                           - Continue omeprazole 20 mg p.o. once a day.                           - [name] complete                           - Await pathology results.                           - No aspirin, ibuprofen, naproxen, or other                            non-steroidal anti-inflammatory drugs for 5 days                            after biopsy.                           - Return to GI clinic PRN. Will perform further                            work-up if still  with problems. Jackquline Denmark, MD 07/20/2018 10:21:15 AM This report has been signed electronically. Addendum Number: 1   Addendum Date: 07/22/2018 6:25:57 PM      [name] abdo complete orderd after EGD Jackquline Denmark, MD 07/22/2018 6:26:31 PM This report has been signed electronically.

## 2018-07-20 NOTE — Op Note (Signed)
[facility] Patient Name: [name] Procedure Date: 07/20/2018 9:47 AM MRN: [id number] Endoscopist: Jackquline Denmark , MD Age: 48 Referring MD:  Date of Birth: 1971-07-04 Gender: Female Account #: [id number] Procedure:                Colonoscopy Indications:              Abdominal pain in the right lower quadrant Medicines:                Monitored Anesthesia Care Procedure:                Pre-Anesthesia Assessment:                           - Prior to the procedure, a History and Physical                            was performed, and patient medications and                            allergies were reviewed. The patient's tolerance of                            previous anesthesia was also reviewed. The risks                            and benefits of the procedure and the sedation                            options and risks were discussed with the patient.                            All questions were answered, and informed consent                            was obtained. Prior Anticoagulants: The patient has                            taken no previous anticoagulant or antiplatelet                            agents. ASA Grade Assessment: II - A patient with                            mild systemic disease. After reviewing the risks                            and benefits, the patient was deemed in                            satisfactory condition to undergo the procedure.                           After obtaining informed consent, the colonoscope  was passed under direct vision. Throughout the                            procedure, the patient's blood pressure, pulse, and                            oxygen saturations were monitored continuously. The                            Model PCF-[id number]) scope was introduced                            through the anus and advanced to the 2 cm into the                            ileum. The colonoscopy  was performed without                            difficulty. The patient tolerated the procedure                            well. The quality of the bowel preparation was                            excellent. Scope In: 10:01:58 AM Scope Out: 10:15:36 AM Scope Withdrawal Time: 0 hours 9 minutes 16 seconds  Total Procedure Duration: 0 hours 13 minutes 38 seconds  Findings:                 Non-bleeding internal hemorrhoids were found during                            retroflexion. The hemorrhoids were small.                           The exam was otherwise without abnormality on                            direct and retroflexion views.                           The terminal ileum appeared normal. Complications:            No immediate complications. Estimated Blood Loss:     Estimated blood loss: none. Impression:               - Small internal hemorrhoids.                           - Otherwise normal colonoscopy to TI. Recommendation:           - Patient has a contact number available for                            emergencies. The signs and symptoms of potential  delayed complications were discussed with the                            patient. Return to normal activities tomorrow.                            Written discharge instructions were provided to the                            patient.                           - Resume previous diet.                           - Continue present medications.                           - Repeat colonoscopy in 10 years for screening                            purposes.                           - Return to GI clinic PRN. Will perform further                            work-up if still with problems. Jackquline Denmark, MD 07/20/2018 10:24:56 AM This report has been signed electronically.

## 2018-07-20 NOTE — Progress Notes (Signed)
Report given to PACU, vss 

## 2018-07-21 ENCOUNTER — Telehealth: Payer: Self-pay

## 2018-07-21 ENCOUNTER — Telehealth: Payer: Self-pay | Admitting: *Deleted

## 2018-07-21 NOTE — Telephone Encounter (Signed)
  Follow up Call-  Call back number 07/20/2018  Post procedure Call Back phone  # [phone]  Permission to leave phone message Yes  Some recent data might be hidden     Patient questions:  Do you have a fever, pain , or abdominal swelling? No. Pain Score  0 *  Have you tolerated food without any problems? Yes.    Have you been able to return to your normal activities? Yes.    Do you have any questions about your discharge instructions: Diet   No. Medications  No. Follow up visit  No.  Do you have questions or concerns about your Care? No.  Actions: * If pain score is 4 or above: No action needed, pain <4.

## 2018-07-21 NOTE — Telephone Encounter (Signed)
  Follow up Call-  Call back number 07/20/2018  Post procedure Call Back phone  # [phone]  Permission to leave phone message Yes  Some recent data might be hidden    [facility]

## 2018-07-26 ENCOUNTER — Encounter: Payer: Self-pay | Admitting: Gastroenterology

## 2018-07-27 ENCOUNTER — Telehealth: Payer: Self-pay | Admitting: Gastroenterology

## 2018-07-27 ENCOUNTER — Other Ambulatory Visit: Payer: Self-pay

## 2018-07-27 DIAGNOSIS — K2950 Unspecified chronic gastritis without bleeding: Secondary | ICD-10-CM

## 2018-07-27 DIAGNOSIS — R1013 Epigastric pain: Secondary | ICD-10-CM

## 2018-07-27 DIAGNOSIS — R1031 Right lower quadrant pain: Secondary | ICD-10-CM

## 2018-07-27 NOTE — Telephone Encounter (Signed)
Called and spoke with patient-patient informed of [name] complete abdomen scheduled at [place] location on 07/29/2018 at 10:00 am; patient informed she must be NPO for 6 hours pre procedure; patient also given the phone number [phone] in case of need for appt cancel/reschedule; Patient verbalized understanding of information/instructions; Patient was advised to call back if questions/concerns arise;

## 2018-07-27 NOTE — Telephone Encounter (Signed)
Pt states that Dr. Lyndel Safe was going to send her for an [name] but pt has not heard from anybody.

## 2018-07-29 ENCOUNTER — Ambulatory Visit (HOSPITAL_BASED_OUTPATIENT_CLINIC_OR_DEPARTMENT_OTHER): Payer: 59

## 2018-07-29 ENCOUNTER — Ambulatory Visit (HOSPITAL_BASED_OUTPATIENT_CLINIC_OR_DEPARTMENT_OTHER)
Admission: RE | Admit: 2018-07-29 | Discharge: 2018-07-29 | Disposition: A | Discharge status: Home / Self Care | Payer: 59 | Source: Ambulatory Visit | Attending: Gastroenterology | Admitting: Gastroenterology

## 2018-07-29 DIAGNOSIS — K2950 Unspecified chronic gastritis without bleeding: Secondary | ICD-10-CM

## 2018-07-29 DIAGNOSIS — R1031 Right lower quadrant pain: Secondary | ICD-10-CM

## 2018-07-29 DIAGNOSIS — R1013 Epigastric pain: Secondary | ICD-10-CM | POA: Diagnosis not present

## 2018-07-29 DIAGNOSIS — K824 Cholesterolosis of gallbladder: Secondary | ICD-10-CM | POA: Diagnosis not present

## 2018-08-08 ENCOUNTER — Other Ambulatory Visit: Payer: Self-pay | Admitting: Obstetrics and Gynecology

## 2018-08-08 DIAGNOSIS — Z1231 Encounter for screening mammogram for malignant neoplasm of breast: Secondary | ICD-10-CM

## 2018-08-08 DIAGNOSIS — N814 Uterovaginal prolapse, unspecified: Secondary | ICD-10-CM | POA: Diagnosis not present

## 2018-08-08 DIAGNOSIS — N812 Incomplete uterovaginal prolapse: Secondary | ICD-10-CM | POA: Diagnosis not present

## 2018-08-08 MED FILL — PROAIR HFA 90 MCG INHALER: 108 (90 BAS | 50 days supply | Qty: 26 | Fill #1

## 2018-08-09 ENCOUNTER — Encounter (INDEPENDENT_AMBULATORY_CARE_PROVIDER_SITE_OTHER): Payer: Self-pay | Admitting: Family Medicine

## 2018-08-09 ENCOUNTER — Ambulatory Visit (INDEPENDENT_AMBULATORY_CARE_PROVIDER_SITE_OTHER): Payer: 59 | Admitting: Family Medicine

## 2018-08-09 VITALS — BP 122/84 | HR 72 | Temp 97.9°F | Ht 66.0 in | Wt 188.0 lb

## 2018-08-09 DIAGNOSIS — Z6830 Body mass index (BMI) 30.0-30.9, adult: Secondary | ICD-10-CM | POA: Diagnosis not present

## 2018-08-09 DIAGNOSIS — Z9189 Other specified personal risk factors, not elsewhere classified: Secondary | ICD-10-CM

## 2018-08-09 DIAGNOSIS — E669 Obesity, unspecified: Secondary | ICD-10-CM | POA: Diagnosis not present

## 2018-08-09 DIAGNOSIS — E559 Vitamin D deficiency, unspecified: Secondary | ICD-10-CM | POA: Diagnosis not present

## 2018-08-09 DIAGNOSIS — I10 Essential (primary) hypertension: Secondary | ICD-10-CM

## 2018-08-09 MED ORDER — VITAMIN D (ERGOCALCIFEROL) 1.25 MG (50000 UNIT) PO CAPS: 50000.0000 [IU] | ORAL_CAPSULE | ORAL | 0 refills | Status: DC

## 2018-08-09 MED FILL — VIT D2 1.25 MG (50,000 UNIT: 1.25 MG | 28 days supply | Qty: 4 | Fill #0

## 2018-08-09 NOTE — Progress Notes (Signed)
Office: [phone]  /  Fax: [phone]   HPI:   Chief Complaint: OBESITY Briya is here to discuss her progress with her obesity treatment plan. She is on the  keep a food journal with 1300 calories and 85 grams of protein daily and is following her eating plan approximately 80 % of the time. She states she is exercising 0 minutes 0 times per week. Clint has been staying on plan well even during [employer]. She is meeting her protein goal.                              Her weight is 188 lb (85.3 kg) today and has had a weight loss of 1 pounds over a period of 4 weeks since her last visit. She has lost 0 lbs since starting treatment with [name].  Vitamin D deficiency Jaquala has a diagnosis of vitamin D deficiency. She is currently taking prescription Vit D and denies nausea, vomiting or muscle weakness. Her Vit D level is not at goal. Her last Vit D level was 37.1 on 07/13/2018. Bo's level has decreased despite being on prescription Vit D.  Hypertension TIAJAH OYSTER is a 48 y.o. female with hypertension. Maricia Scotti Bolt denies chest pain or shortness of breath. She is working weight loss to help control her blood pressure with the goal of decreasing her risk of heart attack and stroke. [name] blood pressure is currently well controlled. She is taking Norvasc 10 mg qd.   At risk for osteopenia and osteoporosis Abbrielle is at higher risk of osteopenia and osteoporosis due to vitamin D deficiency.    ASSESSMENT AND PLAN:  Vitamin D deficiency - Plan: Vitamin D, Ergocalciferol, (DRISDOL) 1.25 MG (50000 UT) CAPS capsule  Essential hypertension  At risk for osteoporosis  Class 1 obesity with serious comorbidity and body mass index (BMI) of 30.0 to 30.9 in adult, unspecified obesity type  PLAN:  Vitamin D Deficiency Katryn was informed that low vitamin D levels contributes to fatigue and are associated with obesity, breast, and colon cancer. Mayetta agrees to continue to take prescription Vit D @50 ,000 IU  every week #4 with no refills and add OTC vitamin D 2000 IU daily. She will follow up for routine testing of vitamin D, at least 2-3 times per year. She was informed of the risk of over-replacement of vitamin D and agrees to not increase her dose unless she discusses this with [name] first. Caylin agrees to follow up with our clinic in 3 weeks.  Hypertension We discussed sodium restriction, working on healthy weight loss, and a regular exercise program as the means to achieve improved blood pressure control. Edna will continue taking Norvasc 10 mg daily and continue with meal plan. We discussed the possibility off coming off medication for HTN with weight loss. Pa agrees with this plan and will follow up with our clinic in 3 weeks. We will continue to monitor her blood pressure as well as her progress with the above lifestyle modifications. She will watch for signs of hypotension as she continues her lifestyle modifications.  At risk for osteopenia and osteoporosis Ellyssa was given extended  (15 minutes) osteoporosis prevention counseling today. Alethea is at risk for osteopenia and osteoporosis due to her vitamin D deficiency. She was encouraged to take her vitamin D and follow her higher calcium diet and increase strengthening exercise to help strengthen her bones and decrease her risk of osteopenia  and osteoporosis.  Obesity Brooksie is currently in the action stage of change. As such, her goal is to continue with weight loss efforts She has agreed to keep a food journal with 1300 calories and 85 grams of  protein daily. John has not been prescribed exercise at this time. Teyanna has not been prescribed exercise. We discussed the following Behavioral Modification Strategies today: increase H20 intake, Planning for success and keep a strict food journal Afiya has agreed to follow up with our clinic in 3 weeks. She was informed of the importance of frequent follow up visits to maximize her success with intensive  lifestyle modifications for her multiple health conditions.  ALLERGIES: No Known Allergies  MEDICATIONS: Current Outpatient Medications on File Prior to Visit  Medication Sig Dispense Refill  . amLODipine (NORVASC) 5 MG tablet Take 10 mg by mouth daily.    Marland Kitchen omeprazole (PRILOSEC) 20 MG capsule Take 1 capsule (20 mg total) by mouth daily. 30 capsule 11  . SUMAtriptan (IMITREX) 50 MG tablet Take 50 mg by mouth every 2 (two) hours as needed for migraine. May repeat in 2 hours if headache persists or recurs.    Marland Kitchen ibuprofen (ADVIL,MOTRIN) 400 MG tablet Take 1 tablet (400 mg total) by mouth 3 (three) times daily. (Patient not taking: Reported on 08/09/2018) 30 tablet 0   No current facility-administered medications on file prior to visit.     PAST MEDICAL HISTORY: Past Medical History:  Diagnosis Date  . Anemia   . Asthma    as child  . Back pain   . GERD (gastroesophageal reflux disease)   . Headache   . Hypertension   . Joint pain   . Migraines   . Parathyroid abnormality ([place])   . Swelling    feet and legs  . Vitamin D deficiency     PAST SURGICAL HISTORY: Past Surgical History:  Procedure Laterality Date  . ESOPHAGOGASTRODUODENOSCOPY  09/13/2003   Normal EGD.   Marland Kitchen FOOT SURGERY Bilateral 2001  . PARATHYROIDECTOMY N/A 01/03/2016   Procedure: PARATHYROIDECTOMY;  Surgeon: Leta Baptist, MD;  Location: [place];  Service: ENT;  Laterality: N/A;  . TUBAL LIGATION      SOCIAL HISTORY: Social History   Tobacco Use  . Smoking status: Never Smoker  . Smokeless tobacco: Never Used  Substance Use Topics  . Alcohol use: No  . Drug use: No    FAMILY HISTORY: Family History  Problem Relation Age of Onset  . Hypertension Father   . Cancer Maternal Grandmother   . Heart disease Paternal Grandmother   . Cancer Paternal Grandmother   . Diabetes Paternal Grandmother   . Melanoma Maternal Grandfather   . Colon cancer Neg Hx   . Colon polyps Neg Hx   . Esophageal  cancer Neg Hx   . Rectal cancer Neg Hx   . Stomach cancer Neg Hx     ROS: Review of Systems  Constitutional: Positive for weight loss.  Respiratory: Negative for shortness of breath.   Cardiovascular: Negative for chest pain.  Gastrointestinal: Negative for nausea and vomiting.  Musculoskeletal:       Negative for muscle weakness    PHYSICAL EXAM: Blood pressure 122/84, pulse 72, temperature 97.9 F (36.6 C), temperature source Oral, height 5\' 6"  (1.676 m), weight 188 lb (85.3 kg), SpO2 97 %. Body mass index is 30.34 kg/m. Physical Exam Vitals signs reviewed.  Constitutional:      Appearance: Normal appearance. She is obese.  Cardiovascular:  Rate and Rhythm: Normal rate.     Pulses: Normal pulses.  Pulmonary:     Effort: Pulmonary effort is normal.  Musculoskeletal: Normal range of motion.  Skin:    General: Skin is warm and dry.  Neurological:     Mental Status: She is alert and oriented to person, place, and time.  Psychiatric:        Mood and Affect: Mood normal.        Behavior: Behavior normal.     RECENT LABS AND TESTS: BMET    Component Value Date/Time   NA 141 07/13/2018 1006   K 4.4 07/13/2018 1006   CL 103 07/13/2018 1006   CO2 22 07/13/2018 1006   GLUCOSE 88 07/13/2018 1006   BUN 17 07/13/2018 1006   CREATININE 0.89 07/13/2018 1006   CALCIUM 9.1 07/13/2018 1006   CALCIUM 7.5 (L) 01/03/2016 1520   GFRNONAA 77 07/13/2018 1006   GFRAA 89 07/13/2018 1006   Lab Results  Component Value Date   HGBA1C 5.5 03/02/2018   HGBA1C 5.5 09/01/2017   HGBA1C 5.6 11/10/2016   HGBA1C 5.5 08/13/2016   Lab Results  Component Value Date   INSULIN 5.8 03/02/2018   INSULIN 3.7 09/01/2017   INSULIN 5.6 11/10/2016   INSULIN 7.9 08/13/2016   CBC    Component Value Date/Time   WBC 4.3 03/02/2018 0746   RBC 4.41 03/02/2018 0746   HGB 12.7 03/02/2018 0746   HCT 38.4 03/02/2018 0746   MCV 87 03/02/2018 0746   MCH 28.8 03/02/2018 0746   MCHC 33.1  03/02/2018 0746   RDW 12.5 03/02/2018 0746   LYMPHSABS 1.8 03/02/2018 0746   EOSABS 0.1 03/02/2018 0746   BASOSABS 0.0 03/02/2018 0746   Iron/TIBC/Ferritin/ %Sat No results found for: IRON, TIBC, FERRITIN, IRONPCTSAT Lipid Panel     Component Value Date/Time   CHOL 212 (H) 07/13/2018 1006   TRIG 50 07/13/2018 1006   HDL 75 07/13/2018 1006   LDLCALC 127 (H) 07/13/2018 1006   Hepatic Function Panel     Component Value Date/Time   PROT 7.0 07/13/2018 1006   ALBUMIN 4.4 07/13/2018 1006   AST 15 07/13/2018 1006   ALT 22 07/13/2018 1006   ALKPHOS 58 07/13/2018 1006   BILITOT 0.4 07/13/2018 1006      Component Value Date/Time   TSH 1.870 03/02/2018 0746   TSH 2.080 08/13/2016 0928     Ref. Range 07/13/2018 10:06  Vitamin D, 25-Hydroxy Latest Ref Range: 30.0 - 100.0 ng/mL 37.1     OBESITY BEHAVIORAL INTERVENTION VISIT  Today's visit was # 36  Starting weight: 188 lbs Starting date: 08/13/2016 Today's weight :: 188 lbs Today's date: 08/09/2018 Total lbs lost to date: 0   ASK: We discussed the diagnosis of obesity with [name] today and Sabre agreed to give [name] permission to discuss obesity behavioral modification therapy today.  ASSESS: Dakia has the diagnosis of obesity and her BMI today is 30.36 Zahli is in the action stage of change   ADVISE: Shavawn was educated on the multiple health risks of obesity as well as the benefit of weight loss to improve her health. She was advised of the need for long term treatment and the importance of lifestyle modifications to improve her current health and to decrease her risk of future health problems.  AGREE: Multiple dietary modification options and treatment options were discussed and  Rhina agreed to follow the recommendations documented in the above note.  ARRANGE: Maizie was  educated on the importance of frequent visits to treat obesity as outlined per CMS and USPSTF guidelines and agreed to schedule her next follow up appointment  today.  I, Tammy Wysor, am acting as Location manager for [employer], FNP-C.  I have reviewed the above documentation for accuracy and completeness, and I agree with the above.  - Tracee Mccreery, FNP-C.

## 2018-08-10 DIAGNOSIS — K210 Gastro-esophageal reflux disease with esophagitis: Secondary | ICD-10-CM | POA: Diagnosis not present

## 2018-08-10 DIAGNOSIS — N281 Cyst of kidney, acquired: Secondary | ICD-10-CM | POA: Diagnosis not present

## 2018-08-18 ENCOUNTER — Other Ambulatory Visit: Payer: Self-pay | Admitting: Family Medicine

## 2018-08-18 DIAGNOSIS — R944 Abnormal results of kidney function studies: Secondary | ICD-10-CM

## 2018-08-25 ENCOUNTER — Ambulatory Visit
Admission: RE | Admit: 2018-08-25 | Discharge: 2018-08-25 | Disposition: A | Discharge status: Home / Self Care | Payer: 59 | Source: Ambulatory Visit | Attending: Family Medicine | Admitting: Family Medicine

## 2018-08-25 DIAGNOSIS — R944 Abnormal results of kidney function studies: Secondary | ICD-10-CM

## 2018-08-25 DIAGNOSIS — N281 Cyst of kidney, acquired: Secondary | ICD-10-CM | POA: Diagnosis not present

## 2018-08-25 MED ORDER — GADOBENATE DIMEGLUMINE 529 MG/ML IV SOLN
17.0000 mL | Freq: Once | INTRAVENOUS | Status: AC | PRN
  Administered 2018-08-25: 17 mL via INTRAVENOUS

## 2018-08-30 ENCOUNTER — Ambulatory Visit (INDEPENDENT_AMBULATORY_CARE_PROVIDER_SITE_OTHER): Payer: 59 | Admitting: Family Medicine

## 2018-08-30 ENCOUNTER — Encounter (INDEPENDENT_AMBULATORY_CARE_PROVIDER_SITE_OTHER): Payer: Self-pay | Admitting: Family Medicine

## 2018-08-30 VITALS — BP 123/84 | HR 78 | Ht 66.0 in | Wt 192.0 lb

## 2018-08-30 DIAGNOSIS — E669 Obesity, unspecified: Secondary | ICD-10-CM

## 2018-08-30 DIAGNOSIS — I10 Essential (primary) hypertension: Secondary | ICD-10-CM

## 2018-08-30 DIAGNOSIS — E559 Vitamin D deficiency, unspecified: Secondary | ICD-10-CM

## 2018-08-30 DIAGNOSIS — Z6831 Body mass index (BMI) 31.0-31.9, adult: Secondary | ICD-10-CM | POA: Diagnosis not present

## 2018-08-30 DIAGNOSIS — Z6830 Body mass index (BMI) 30.0-30.9, adult: Secondary | ICD-10-CM | POA: Diagnosis not present

## 2018-08-30 DIAGNOSIS — Z9189 Other specified personal risk factors, not elsewhere classified: Secondary | ICD-10-CM | POA: Diagnosis not present

## 2018-08-30 MED ORDER — VITAMIN D (ERGOCALCIFEROL) 1.25 MG (50000 UNIT) PO CAPS: 50000.0000 [IU] | ORAL_CAPSULE | ORAL | 0 refills | Status: DC

## 2018-08-30 NOTE — Progress Notes (Signed)
Office: [phone]  /  Fax: [phone]   HPI:   Chief Complaint: OBESITY [name] is here to discuss her progress with her obesity treatment plan. She is journaling and is following her eating plan approximately 80% of the time. She states she is exercising 0 minutes 0 times per week. [name] states increased stress has caused her to eat more. She reports having health problems involving her kidneys which has increased her stress. She will get results of a recent MRI soon. Her weight is 192 lb (87.1 kg) today and has had a weight gain of 4 lbs since her last visit. She has lost 0 lbs since starting treatment with [name].  Vitamin D deficiency [name] has a diagnosis of Vitamin D deficiency with her last Vitamin D level not at goal at 37.1 on 07/13/2018. She is currently taking prescription Vit D and denies nausea, vomiting or muscle weakness.  Hypertension [name] is a 48 y.o. female with hypertension.  [name] denies chest pain or shortness of breath on exertion. She is working weight loss to help control her blood pressure with the goal of decreasing her risk of heart attack and stroke. [name] blood pressure is currently well controlled on Norvasc 10 mg. She denies chest pain or shortness of breath.  At risk for cardiovascular disease [name] is at a higher than average risk for cardiovascular disease due to obesity. She currently denies any chest pain.  ASSESSMENT AND PLAN:  Vitamin D deficiency - Plan: Vitamin D, Ergocalciferol, (DRISDOL) 1.25 MG (50000 UT) CAPS capsule  Essential hypertension  At risk for heart disease  Class 1 obesity with serious comorbidity and body mass index (BMI) of 30.0 to 30.9 in adult, unspecified obesity type  PLAN:  Vitamin D Deficiency [name] was informed that low Vitamin D levels contributes to fatigue and are associated with obesity, breast, and colon cancer. She agrees to continue to take prescription Vit D @ 50,000 IU every week #4 with no refills and  will follow-up for routine testing of Vitamin D, at least 2-3 times per year. She was informed of the risk of over-replacement of Vitamin D and agrees to not increase her dose unless she discusses this with [name] first. [name] agrees to follow-up with our clinic in 4 weeks.  Hypertension We discussed sodium restriction, working on healthy weight loss, and a regular exercise program as the means to achieve improved blood pressure control. [name] agreed with this plan and agreed to follow up as directed. We will continue to monitor her blood pressure as well as her progress with the above lifestyle modifications. She will continue her Norvasc as prescribed and will watch for signs of hypotension as she continues her lifestyle modifications.  Cardiovascular risk counseling [name] was given extended (15 minutes) coronary artery disease prevention counseling today. She is 48 y.o. female and has risk factors for heart disease including obesity. We discussed intensive lifestyle modifications today with an emphasis on specific weight loss instructions and strategies. Pt was also informed of the importance of increasing exercise and decreasing saturated fats to help prevent heart disease.  Obesity [name] is currently in the action stage of change. As such, her goal is to continue with weight loss efforts. She has agreed to follow our low carbohydrate diet plan with 1/2 cup berries per day.  We discussed the following Behavioral Modification Strategies today: decreasing simple carbohydrates, decreasing sodium intake, better snacking choices, and planning for success. [name] has not been prescribed exercise at  this time. [name] has agreed to follow-up with our clinic in 4 weeks. She was informed of the importance of frequent follow up visits to maximize her success with intensive lifestyle modifications for her multiple health conditions.  ALLERGIES: No Known Allergies  MEDICATIONS: Current Outpatient Medications on File  Prior to Visit  Medication Sig Dispense Refill  . amLODipine (NORVASC) 5 MG tablet Take 10 mg by mouth daily.    Marland Kitchen ibuprofen (ADVIL,MOTRIN) 400 MG tablet Take 1 tablet (400 mg total) by mouth 3 (three) times daily. 30 tablet 0  . omeprazole (PRILOSEC) 20 MG capsule Take 1 capsule (20 mg total) by mouth daily. 30 capsule 11  . SUMAtriptan (IMITREX) 50 MG tablet Take 50 mg by mouth every 2 (two) hours as needed for migraine. May repeat in 2 hours if headache persists or recurs.     No current facility-administered medications on file prior to visit.     PAST MEDICAL HISTORY: Past Medical History:  Diagnosis Date  . Anemia   . Asthma    as child  . Back pain   . GERD (gastroesophageal reflux disease)   . Headache   . Hypertension   . Joint pain   . Migraines   . Parathyroid abnormality ([place])   . Swelling    feet and legs  . Vitamin D deficiency     PAST SURGICAL HISTORY: Past Surgical History:  Procedure Laterality Date  . ESOPHAGOGASTRODUODENOSCOPY  09/13/2003   Normal EGD.   Marland Kitchen FOOT SURGERY Bilateral 2001  . PARATHYROIDECTOMY N/A 01/03/2016   Procedure: PARATHYROIDECTOMY;  Surgeon: Leta Baptist, MD;  Location: [place];  Service: ENT;  Laterality: N/A;  . TUBAL LIGATION      SOCIAL HISTORY: Social History   Tobacco Use  . Smoking status: Never Smoker  . Smokeless tobacco: Never Used  Substance Use Topics  . Alcohol use: No  . Drug use: No    FAMILY HISTORY: Family History  Problem Relation Age of Onset  . Hypertension Father   . Cancer Maternal Grandmother   . Heart disease Paternal Grandmother   . Cancer Paternal Grandmother   . Diabetes Paternal Grandmother   . Melanoma Maternal Grandfather   . Colon cancer Neg Hx   . Colon polyps Neg Hx   . Esophageal cancer Neg Hx   . Rectal cancer Neg Hx   . Stomach cancer Neg Hx    ROS: Review of Systems  Constitutional: Negative for weight loss.  Respiratory: Negative for shortness of breath.     Cardiovascular: Negative for chest pain.  Gastrointestinal: Negative for nausea and vomiting.  Musculoskeletal:       Negative for muscle weakness.  Endo/Heme/Allergies:       Negative for hypoglycemia.   PHYSICAL EXAM: Blood pressure 123/84, pulse 78, height 5\' 6"  (1.676 m), weight 192 lb (87.1 kg), SpO2 99 %. Body mass index is 30.99 kg/m. Physical Exam Vitals signs reviewed.  Constitutional:      Appearance: Normal appearance. She is obese.  Cardiovascular:     Rate and Rhythm: Normal rate.     Pulses: Normal pulses.  Pulmonary:     Effort: Pulmonary effort is normal.     Breath sounds: Normal breath sounds.  Musculoskeletal: Normal range of motion.  Skin:    General: Skin is warm and dry.  Neurological:     Mental Status: She is alert and oriented to person, place, and time.  Psychiatric:  Behavior: Behavior normal.   RECENT LABS AND TESTS: BMET    Component Value Date/Time   NA 141 07/13/2018 1006   K 4.4 07/13/2018 1006   CL 103 07/13/2018 1006   CO2 22 07/13/2018 1006   GLUCOSE 88 07/13/2018 1006   BUN 17 07/13/2018 1006   CREATININE 0.89 07/13/2018 1006   CALCIUM 9.1 07/13/2018 1006   CALCIUM 7.5 (L) 01/03/2016 1520   GFRNONAA 77 07/13/2018 1006   GFRAA 89 07/13/2018 1006   Lab Results  Component Value Date   HGBA1C 5.5 03/02/2018   HGBA1C 5.5 09/01/2017   HGBA1C 5.6 11/10/2016   HGBA1C 5.5 08/13/2016   Lab Results  Component Value Date   INSULIN 5.8 03/02/2018   INSULIN 3.7 09/01/2017   INSULIN 5.6 11/10/2016   INSULIN 7.9 08/13/2016   CBC    Component Value Date/Time   WBC 4.3 03/02/2018 0746   RBC 4.41 03/02/2018 0746   HGB 12.7 03/02/2018 0746   HCT 38.4 03/02/2018 0746   MCV 87 03/02/2018 0746   MCH 28.8 03/02/2018 0746   MCHC 33.1 03/02/2018 0746   RDW 12.5 03/02/2018 0746   LYMPHSABS 1.8 03/02/2018 0746   EOSABS 0.1 03/02/2018 0746   BASOSABS 0.0 03/02/2018 0746   Iron/TIBC/Ferritin/ %Sat No results found for: IRON,  TIBC, FERRITIN, IRONPCTSAT Lipid Panel     Component Value Date/Time   CHOL 212 (H) 07/13/2018 1006   TRIG 50 07/13/2018 1006   HDL 75 07/13/2018 1006   LDLCALC 127 (H) 07/13/2018 1006   Hepatic Function Panel     Component Value Date/Time   PROT 7.0 07/13/2018 1006   ALBUMIN 4.4 07/13/2018 1006   AST 15 07/13/2018 1006   ALT 22 07/13/2018 1006   ALKPHOS 58 07/13/2018 1006   BILITOT 0.4 07/13/2018 1006      Component Value Date/Time   TSH 1.870 03/02/2018 0746   TSH 2.080 08/13/2016 0928    Ref. Range 07/13/2018 10:06  Vitamin D, 25-Hydroxy Latest Ref Range: 30.0 - 100.0 ng/mL 37.1    OBESITY BEHAVIORAL INTERVENTION VISIT  Today's visit was #38  Starting weight: 188 lbs Starting date: 08/13/2016 Today's weight: 192 lbs  Today's date: 08/30/2018 Total lbs lost to date: 0    08/30/2018  Height 5\' 6"  (1.676 m)  Weight 192 lb (87.1 kg)  BMI (Calculated) 31  BLOOD PRESSURE - SYSTOLIC 329  BLOOD PRESSURE - DIASTOLIC 84   Body Fat % 51.8 %  Total Body Water (lbs) 76.8 lbs   ASK: We discussed the diagnosis of obesity with [name] today and [name] agreed to give [name] permission to discuss obesity behavioral modification therapy today.  ASSESS: [name] has the diagnosis of obesity and her BMI today is 31. [name] is in the action stage of change.   ADVISE: [name] was educated on the multiple health risks of obesity as well as the benefit of weight loss to improve her health. She was advised of the need for long term treatment and the importance of lifestyle modifications to improve her current health and to decrease her risk of future health problems.  AGREE: Multiple dietary modification options and treatment options were discussed and  [name] agreed to follow the recommendations documented in the above note.  ARRANGE: [name] was educated on the importance of frequent visits to treat obesity as outlined per CMS and USPSTF guidelines and agreed to schedule her next follow up  appointment today.  IMichaelene Song, am acting as Location manager for [employer]  , FNP-C.  I have reviewed the above documentation for accuracy and completeness, and I agree with the above.  -  , FNP-C.

## 2018-09-05 ENCOUNTER — Ambulatory Visit
Admission: RE | Admit: 2018-09-05 | Discharge: 2018-09-05 | Disposition: A | Discharge status: Home / Self Care | Payer: 59 | Source: Ambulatory Visit | Attending: Obstetrics and Gynecology | Admitting: Obstetrics and Gynecology

## 2018-09-05 DIAGNOSIS — Z1231 Encounter for screening mammogram for malignant neoplasm of breast: Secondary | ICD-10-CM

## 2018-09-07 ENCOUNTER — Telehealth: Payer: Self-pay | Admitting: Gastroenterology

## 2018-09-07 DIAGNOSIS — R1013 Epigastric pain: Secondary | ICD-10-CM | POA: Diagnosis not present

## 2018-09-07 NOTE — Telephone Encounter (Signed)
Patient had MRI of the abdomen due to abnormal ultrasound showing questionable renal lesion.  It showed pancreatic divisum.  Discussed with Dr. Unk Lightning over the phone.  I have personally reviewed the MRI as well.  There is no pancreatic ductal dilatation.  Previous labs revealed normal amylase and lipase.  There is no previous history of pancreatitis.  Plan: -If she has any further abdominal pain, check amylase lipase at the time of abdominal pain.  Can also check liver function tests. Dr Robbins/Jennifer at Dr. Unk Lightning office will take care of this. -If elevated or if she continues to have abdominal pain, would consider EUS.  Given the duct is not dilated, I believe it will have low yield.  Would also not like to put her through ERCP as it may be more harmful in case she has pancreatitis due to ERCP.

## 2018-09-22 DIAGNOSIS — H52221 Regular astigmatism, right eye: Secondary | ICD-10-CM | POA: Diagnosis not present

## 2018-09-26 ENCOUNTER — Encounter (INDEPENDENT_AMBULATORY_CARE_PROVIDER_SITE_OTHER): Payer: Self-pay

## 2018-09-27 ENCOUNTER — Ambulatory Visit (INDEPENDENT_AMBULATORY_CARE_PROVIDER_SITE_OTHER): Payer: 59 | Admitting: Family Medicine

## 2018-09-27 ENCOUNTER — Encounter (INDEPENDENT_AMBULATORY_CARE_PROVIDER_SITE_OTHER): Payer: Self-pay

## 2018-11-01 MED FILL — AMLODIPINE BESYLATE 10 MG T: 10 | 90 days supply | Qty: 90 | Fill #0

## 2018-11-09 ENCOUNTER — Encounter (INDEPENDENT_AMBULATORY_CARE_PROVIDER_SITE_OTHER): Payer: Self-pay | Admitting: Bariatrics

## 2018-11-09 ENCOUNTER — Ambulatory Visit (INDEPENDENT_AMBULATORY_CARE_PROVIDER_SITE_OTHER): Payer: 59 | Admitting: Bariatrics

## 2018-11-09 ENCOUNTER — Other Ambulatory Visit: Payer: Self-pay

## 2018-11-09 DIAGNOSIS — I10 Essential (primary) hypertension: Secondary | ICD-10-CM

## 2018-11-09 DIAGNOSIS — E669 Obesity, unspecified: Secondary | ICD-10-CM | POA: Diagnosis not present

## 2018-11-09 DIAGNOSIS — E559 Vitamin D deficiency, unspecified: Secondary | ICD-10-CM

## 2018-11-09 DIAGNOSIS — Z6831 Body mass index (BMI) 31.0-31.9, adult: Secondary | ICD-10-CM | POA: Diagnosis not present

## 2018-11-10 MED ORDER — HYDROCHLOROTHIAZIDE 12.5 MG PO TABS: 12.5000 mg | ORAL_TABLET | Freq: Every day | ORAL | 0 refills | Status: DC

## 2018-11-10 MED FILL — HYDROCHLOROTHIAZIDE 12.5 MG: 12.5 | 30 days supply | Qty: 30 | Fill #0

## 2018-11-14 ENCOUNTER — Ambulatory Visit (INDEPENDENT_AMBULATORY_CARE_PROVIDER_SITE_OTHER): Payer: 59 | Admitting: Podiatry

## 2018-11-14 ENCOUNTER — Other Ambulatory Visit: Payer: Self-pay

## 2018-11-14 DIAGNOSIS — M722 Plantar fascial fibromatosis: Secondary | ICD-10-CM | POA: Diagnosis not present

## 2018-11-14 NOTE — Progress Notes (Signed)
Office: [phone]  /  Fax: [phone] TeleHealth Visit:  [name] has verbally consented to this TeleHealth visit today. The patient is located at work, the provider is located at the [employer] [facility] office. The participants in this visit include the listed provider and patient and any and all parties involved. The visit was conducted today via FaceTime.  HPI:   Chief Complaint: OBESITY [name] is here to discuss her progress with her obesity treatment plan. She is on the keep a food journal with 1300 calories and 85 to 90 grams of protein daily and is following her eating plan approximately 85 to 90 % of the time. She states she is walking 45 minutes 5 times per week. Miosha states that she has lost 3 to 4 pounds since her last visit.  We were unable to weigh the patient today for this TeleHealth visit. She feels as if she has lost weight since her last visit.   Vitamin D deficiency [name] has a diagnosis of vitamin D deficiency. She is taking high dose vit D and denies nausea, vomiting or muscle weakness.  Hypertension [name] is a 48 y.o. female with hypertension. She is taking Norvasc. [name] denies chest pain or shortness of breath on exertion. She is working weight loss to help control her blood pressure with the goal of decreasing her risk of heart attack and stroke. [name] states her blood pressure is well controlled.  ASSESSMENT AND PLAN:  Essential hypertension - Plan: hydrochlorothiazide (HYDRODIURIL) 12.5 MG tablet  Vitamin D deficiency  Class 1 obesity with serious comorbidity and body mass index (BMI) of 31.0 to 31.9 in adult, unspecified obesity type  PLAN:  Vitamin D Deficiency [name] was informed that low vitamin D levels contributes to fatigue and are associated with obesity, breast, and colon cancer. She agrees to continue to take prescription Vit D @50 ,000 IU every week and will follow up for routine testing of vitamin D, at least 2-3 times per year.  She was informed of the risk of over-replacement of vitamin D and agrees to not increase her dose unless she discusses this with [name] first.  Hypertension We discussed sodium restriction, working on healthy weight loss, and a regular exercise program as the means to achieve improved blood pressure control. [name] agreed with this plan and agreed to follow up as directed. We will continue to monitor her blood pressure as well as her progress with the above lifestyle modifications. She agrees to continue Norvasc and to take HCTZ 12.5 mg once in the morning #30 with no refills and continue to increase her water intake. She will watch for signs of hypotension as she continues her lifestyle modifications.  Obesity [name] is currently in the action stage of change. As such, her goal is to continue with weight loss efforts She has agreed to keep a food journal with 1300 calories and 85 to 90 grams of protein daily [name] will continue her exercise regimen for weight loss and overall health benefits. We discussed the following Behavioral Modification Strategies today: increase H2O intake, no skipping meals, keeping healthy foods in the home, increasing lean protein intake, decreasing simple carbohydrates, increasing vegetables, decrease eating out and work on meal planning and easy cooking plans [name] will weigh herself at home before each visit.  [name] has agreed to follow up with our clinic in 2 weeks. She was informed of the importance of frequent follow up visits to maximize her success with intensive lifestyle  modifications for her multiple health conditions.  ALLERGIES: No Known Allergies  MEDICATIONS: Current Outpatient Medications on File Prior to Visit  Medication Sig Dispense Refill  . amLODipine (NORVASC) 5 MG tablet Take 10 mg by mouth daily.    Marland Kitchen ibuprofen (ADVIL,MOTRIN) 400 MG tablet Take 1 tablet (400 mg total) by mouth 3 (three) times daily. 30 tablet 0  . omeprazole (PRILOSEC) 20 MG capsule  Take 1 capsule (20 mg total) by mouth daily. 30 capsule 11  . SUMAtriptan (IMITREX) 50 MG tablet Take 50 mg by mouth every 2 (two) hours as needed for migraine. May repeat in 2 hours if headache persists or recurs.    . Vitamin D, Ergocalciferol, (DRISDOL) 1.25 MG (50000 UT) CAPS capsule Take 1 capsule (50,000 Units total) by mouth every 7 (seven) days. 4 capsule 0   No current facility-administered medications on file prior to visit.     PAST MEDICAL HISTORY: Past Medical History:  Diagnosis Date  . Anemia   . Asthma    as child  . Back pain   . GERD (gastroesophageal reflux disease)   . Headache   . Hypertension   . Joint pain   . Migraines   . Parathyroid abnormality ([place])   . Swelling    feet and legs  . Vitamin D deficiency     PAST SURGICAL HISTORY: Past Surgical History:  Procedure Laterality Date  . ESOPHAGOGASTRODUODENOSCOPY  09/13/2003   Normal EGD.   Marland Kitchen FOOT SURGERY Bilateral 2001  . PARATHYROIDECTOMY N/A 01/03/2016   Procedure: PARATHYROIDECTOMY;  Surgeon: Leta Baptist, MD;  Location: [facility];  Service: ENT;  Laterality: N/A;  . TUBAL LIGATION      SOCIAL HISTORY: Social History   Tobacco Use  . Smoking status: Never Smoker  . Smokeless tobacco: Never Used  Substance Use Topics  . Alcohol use: No  . Drug use: No    FAMILY HISTORY: Family History  Problem Relation Age of Onset  . Hypertension Father   . Cancer Maternal Grandmother   . Heart disease Paternal Grandmother   . Cancer Paternal Grandmother   . Diabetes Paternal Grandmother   . Melanoma Maternal Grandfather   . Colon cancer Neg Hx   . Colon polyps Neg Hx   . Esophageal cancer Neg Hx   . Rectal cancer Neg Hx   . Stomach cancer Neg Hx     ROS: Review of Systems  Constitutional: Positive for weight loss.  Respiratory: Negative for shortness of breath (on exertion).   Cardiovascular: Negative for chest pain.  Gastrointestinal: Negative for nausea and vomiting.   Musculoskeletal:       Negative for muscle weakness    PHYSICAL EXAM: Pt in no acute distress  RECENT LABS AND TESTS: BMET    Component Value Date/Time   NA 141 07/13/2018 1006   K 4.4 07/13/2018 1006   CL 103 07/13/2018 1006   CO2 22 07/13/2018 1006   GLUCOSE 88 07/13/2018 1006   BUN 17 07/13/2018 1006   CREATININE 0.89 07/13/2018 1006   CALCIUM 9.1 07/13/2018 1006   CALCIUM 7.5 (L) 01/03/2016 1520   GFRNONAA 77 07/13/2018 1006   GFRAA 89 07/13/2018 1006   Lab Results  Component Value Date   HGBA1C 5.5 03/02/2018   HGBA1C 5.5 09/01/2017   HGBA1C 5.6 11/10/2016   HGBA1C 5.5 08/13/2016   Lab Results  Component Value Date   INSULIN 5.8 03/02/2018   INSULIN 3.7 09/01/2017   INSULIN 5.6 11/10/2016  INSULIN 7.9 08/13/2016   CBC    Component Value Date/Time   WBC 4.3 03/02/2018 0746   RBC 4.41 03/02/2018 0746   HGB 12.7 03/02/2018 0746   HCT 38.4 03/02/2018 0746   MCV 87 03/02/2018 0746   MCH 28.8 03/02/2018 0746   MCHC 33.1 03/02/2018 0746   RDW 12.5 03/02/2018 0746   LYMPHSABS 1.8 03/02/2018 0746   EOSABS 0.1 03/02/2018 0746   BASOSABS 0.0 03/02/2018 0746   Iron/TIBC/Ferritin/ %Sat No results found for: IRON, TIBC, FERRITIN, IRONPCTSAT Lipid Panel     Component Value Date/Time   CHOL 212 (H) 07/13/2018 1006   TRIG 50 07/13/2018 1006   HDL 75 07/13/2018 1006   LDLCALC 127 (H) 07/13/2018 1006   Hepatic Function Panel     Component Value Date/Time   PROT 7.0 07/13/2018 1006   ALBUMIN 4.4 07/13/2018 1006   AST 15 07/13/2018 1006   ALT 22 07/13/2018 1006   ALKPHOS 58 07/13/2018 1006   BILITOT 0.4 07/13/2018 1006      Component Value Date/Time   TSH 1.870 03/02/2018 0746   TSH 2.080 08/13/2016 0928     Ref. Range 07/13/2018 10:06  Vitamin D, 25-Hydroxy Latest Ref Range: 30.0 - 100.0 ng/mL 37.1    I, Doreene Nest, am acting as Location manager for [employer]. Owens Shark, DO  I have reviewed the above documentation for accuracy and completeness, and I  agree with the above. -Jearld Lesch, DO

## 2018-11-15 ENCOUNTER — Telehealth (INDEPENDENT_AMBULATORY_CARE_PROVIDER_SITE_OTHER): Payer: 59 | Admitting: Gastroenterology

## 2018-11-15 ENCOUNTER — Encounter: Payer: Self-pay | Admitting: Gastroenterology

## 2018-11-15 VITALS — Ht 66.0 in | Wt 185.0 lb

## 2018-11-15 DIAGNOSIS — R1013 Epigastric pain: Secondary | ICD-10-CM

## 2018-11-15 MED ORDER — PANTOPRAZOLE SODIUM 40 MG PO TBEC: 40.0000 mg | DELAYED_RELEASE_TABLET | Freq: Every day | ORAL | 6 refills | Status: DC

## 2018-11-15 MED ORDER — ONDANSETRON 4 MG PO TBDP: ORAL_TABLET | ORAL | 1 refills | Status: DC

## 2018-11-15 MED FILL — PANTOPRAZOLE SOD DR 40 MG T: 40 | 90 days supply | Qty: 90 | Fill #0

## 2018-11-15 MED FILL — ONDANSETRON ODT 4 MG TABLET: 4 | 5 days supply | Qty: 20 | Fill #0

## 2018-11-15 NOTE — Progress Notes (Signed)
Chief Complaint: Abdominal pain  Referring Provider:  Myrlene Broker, MD      ASSESSMENT AND PLAN;   #1. Epigastric Pain. Nl CBC, CMP and lipase. Neg EGD with SB bx 07/2018, neg colon 07/2018. [name] 07/2018 showing gallbladder polyp, and renal cysts. MRI 08/25/2018-showed pancreas divisum without any ductal dilatation (incidental finding), Bosniak 74F renal cyst (recommended to repeat MRI in 6 months- Dr Unk Lightning).  Never had pancreatitis.  #2. GERD  Plan: -Change omeprazole to Protonix 40 mg p.o. once a day (#30, 6 refills). -HIDA with EF (any Wednesday morning as she is off). -Zofran 4 mg ODT every 6-8 hours as needed (#20, 1 refill).  HPI:    [name] is a 48 y.o. female  For follow-up visit. Still with epigastric pain. Had negative extensive work-up as detailed above including negative EGD with small bowel biopsies, colonoscopy.  Ultrasound showed gallbladder polyp, kidney stones, renal cysts.  This prompted MRI which showed incidental pancreas divisum without PD dilatation.  No previous history of pancreatitis.  MRI did show complex renal cyst Bosniak 74F (recommended repeat MRI in 6 months-per patient, Dr. Unk Lightning will take care of it.  He is aware and patient will follow-up with him for the orders)  Still with nausea but no vomiting, Intermittent heartburn despite omeprazole. "Omeprazole not working". Occasional bloating. Denies any any significant diarrhea or constipation. No fever chills. No recent weight loss.   Additional social history: Works for [employer], has 2 sons-21 and 18 Had [place] 06/2003 Past Medical History:  Diagnosis Date  . Anemia   . Asthma    as child  . Back pain   . GERD (gastroesophageal reflux disease)   . Headache   . Hypertension   . Joint pain   . Migraines   . Parathyroid abnormality ([place])   . Swelling    feet and legs  . Vitamin D deficiency     Past Surgical History:  Procedure Laterality Date  . ESOPHAGOGASTRODUODENOSCOPY   09/13/2003   Normal EGD.   Marland Kitchen FOOT SURGERY Bilateral 2001  . PARATHYROIDECTOMY N/A 01/03/2016   Procedure: PARATHYROIDECTOMY;  Surgeon: Leta Baptist, MD;  Location: [facility];  Service: ENT;  Laterality: N/A;  . TUBAL LIGATION      Family History  Problem Relation Age of Onset  . Hypertension Father   . Cancer Maternal Grandmother   . Heart disease Paternal Grandmother   . Cancer Paternal Grandmother   . Diabetes Paternal Grandmother   . Melanoma Maternal Grandfather   . Colon cancer Neg Hx   . Colon polyps Neg Hx   . Esophageal cancer Neg Hx   . Rectal cancer Neg Hx   . Stomach cancer Neg Hx     Social History   Tobacco Use  . Smoking status: Never Smoker  . Smokeless tobacco: Never Used  Substance Use Topics  . Alcohol use: No  . Drug use: No    Current Outpatient Medications  Medication Sig Dispense Refill  . amLODipine (NORVASC) 5 MG tablet Take 10 mg by mouth daily.    Marland Kitchen ibuprofen (ADVIL,MOTRIN) 400 MG tablet Take 1 tablet (400 mg total) by mouth 3 (three) times daily. (Patient taking differently: Take 400 mg by mouth as needed. ) 30 tablet 0  . Multiple Vitamin (MULTIVITAMIN) tablet Take 1 tablet by mouth daily.    . SUMAtriptan (IMITREX) 50 MG tablet Take 50 mg by mouth every 2 (two) hours as needed for migraine.  May repeat in 2 hours if headache persists or recurs.    . Vitamin D, Ergocalciferol, (DRISDOL) 1.25 MG (50000 UT) CAPS capsule Take 1 capsule (50,000 Units total) by mouth every 7 (seven) days. 4 capsule 0  . hydrochlorothiazide (HYDRODIURIL) 12.5 MG tablet Take 1 tablet (12.5 mg total) by mouth daily. (Patient not taking: Reported on 11/15/2018) 30 tablet 0  . omeprazole (PRILOSEC) 20 MG capsule Take 1 capsule (20 mg total) by mouth daily. (Patient not taking: Reported on 11/15/2018) 30 capsule 11   No current facility-administered medications for this visit.     No Known Allergies  Review of Systems:  neg     Physical Exam:    Ht 5\' 6"   (1.676 m)   Wt 185 lb (83.9 kg)   BMI 29.86 kg/m  Filed Weights   11/15/18 0831  Weight: 185 lb (83.9 kg)   Not examined since it was a tele-visit Data Reviewed: I have personally reviewed following labs and imaging studies  CBC: CBC Latest Ref Rng & Units 03/02/2018 08/13/2016  WBC 3.4 - 10.8 x10E3/uL 4.3 7.2  Hemoglobin 11.1 - 15.9 g/dL 12.7 12.4  Hematocrit 34.0 - 46.6 % 38.4 40.8    CMP: CMP Latest Ref Rng & Units 07/13/2018 03/02/2018 09/01/2017  Glucose 65 - 99 mg/dL 88 95 85  BUN 6 - 24 mg/dL 17 27(H) 12  Creatinine 0.57 - 1.00 mg/dL 0.89 0.95 1.03(H)  Sodium 134 - 144 mmol/L 141 140 143  Potassium 3.5 - 5.2 mmol/L 4.4 4.6 4.6  Chloride 96 - 106 mmol/L 103 103 105  CO2 20 - 29 mmol/L 22 21 25   Calcium 8.7 - 10.2 mg/dL 9.1 9.2 8.9  Total Protein 6.0 - 8.5 g/dL 7.0 6.9 7.1  Total Bilirubin 0.0 - 1.2 mg/dL 0.4 0.3 <0.2  Alkaline Phos 39 - 117 IU/L 58 49 61  AST 0 - 40 IU/L 15 23 19   ALT 0 - 32 IU/L 22 20 20    This service was provided via telemedicine.  The patient was located at office.  The provider was located in office.  The patient did consent to this tele (Doximity video) visit and is aware of possible charges through their insurance for this visit.  The patient was referred by Dr. Unk Lightning.   Time spent on call: 15 min   Carmell Austria, MD 11/15/2018, 9:47 AM  Cc: Myrlene Broker, MD

## 2018-11-15 NOTE — Addendum Note (Signed)
Addended by: Karena Addison on: 11/15/2018 10:08 AM   Modules accepted: Orders

## 2018-11-15 NOTE — Patient Instructions (Signed)
If you are age 49 or older, your body mass index should be between 23-30. Your Body mass index is 29.86 kg/m. If this is out of the aforementioned range listed, please consider follow up with your Primary Care Provider.  If you are age 16 or younger, your body mass index should be between 19-25. Your Body mass index is 29.86 kg/m. If this is out of the aformentioned range listed, please consider follow up with your Primary Care Provider.   We have sent the following medications to your pharmacy for you to pick up at your convenience: Zofran Protonix  You have been scheduled for a HIDA scan at [facility] (1st floor) on 11/22/18. Please arrive 15 minutes prior to your scheduled appointment at  4:97WY. Make certain not to have anything to eat or drink at least 6 hours prior to your test. Should this appointment date or time not work well for you, please call radiology scheduling at [phone].  _____________________________________________________________________ hepatobiliary (HIDA) scan is an imaging procedure used to diagnose problems in the liver, gallbladder and bile ducts. In the HIDA scan, a radioactive chemical or tracer is injected into a vein in your arm. The tracer is handled by the liver like bile. Bile is a fluid produced and excreted by your liver that helps your digestive system break down fats in the foods you eat. Bile is stored in your gallbladder and the gallbladder releases the bile when you eat a meal. A special nuclear medicine scanner (gamma camera) tracks the flow of the tracer from your liver into your gallbladder and small intestine.  During your HIDA scan  You'll be asked to change into a hospital gown before your HIDA scan begins. Your health care team will position you on a table, usually on your back. The radioactive tracer is then injected into a vein in your arm.The tracer travels through your bloodstream to your liver, where it's taken up by the bile-producing cells. The  radioactive tracer travels with the bile from your liver into your gallbladder and through your bile ducts to your small intestine.You may feel some pressure while the radioactive tracer is injected into your vein. As you lie on the table, a special gamma camera is positioned over your abdomen taking pictures of the tracer as it moves through your body. The gamma camera takes pictures continually for about an hour. You'll need to keep still during the HIDA scan. This can become uncomfortable, but you may find that you can lessen the discomfort by taking deep breaths and thinking about other things. Tell your health care team if you're uncomfortable. The radiologist will watch on a computer the progress of the radioactive tracer through your body. The HIDA scan may be stopped when the radioactive tracer is seen in the gallbladder and enters your small intestine. This typically takes about an hour. In some cases extra imaging will be performed if original images aren't satisfactory, if morphine is given to help visualize the gallbladder or if the medication CCK is given to look at the contraction of the gallbladder. This test typically takes 2 hours to complete. ________________________________________________________________________   Thank you,  Dr. Jackquline Denmark

## 2018-11-16 DIAGNOSIS — M722 Plantar fascial fibromatosis: Secondary | ICD-10-CM | POA: Insufficient documentation

## 2018-11-16 HISTORY — DX: Plantar fascial fibromatosis: M72.2

## 2018-11-16 NOTE — Progress Notes (Signed)
Subjective: [name] presents today for concerns of right heel pain, posterior lateral. She states she has been walking and she did change shoes which may have aggravated her symptoms. No recent injury or falls. No numbness or tingling. The pain is localized.  Denies any systemic complaints such as fevers, chills, nausea, vomiting. No acute changes since last appointment, and no other complaints at this time.   Objective: AAO x3, NAD DP/PT pulses palpable bilaterally, CRT less than 3 seconds There is tenderness to palpation along the plantar lateral aspect of the heel along the insertion of the plantar fascia. There is localized edema and faint erythema. No fluctuance or crepitance. No open lesions or pre-ulcerative lesions. The erythema is more from inflammation as opposed to infection.  No pain with calf compression, swelling, warmth, erythema  Assessment: Right heel pain, plantar fasciitis  Plan: -All treatment options discussed with the patient including all alternatives, risks, complications.  -Steroid injection preformed.  -Stretching/icing -Shoe changes -She has a plantar fascial brace.  -Patient encouraged to call the office with any questions, concerns, change in symptoms.   Procedure: Injection Tendon/Ligament Discussed alternatives, risks, complications and verbal consent was obtained.  Location: RIGHT plantar fascia at the glabrous junction; lateral approach. Skin Prep: Alcohol Injectate: 0.5cc 0.5% marcaine plain, 0.5 cc 2% lidocaine plain and, 1 cc kenalog 10. Disposition: Patient tolerated procedure well. Injection site dressed with a band-aid.  Post-injection care was discussed and return precautions discussed.  Trula Slade DPM

## 2018-11-22 ENCOUNTER — Other Ambulatory Visit: Payer: Self-pay

## 2018-11-22 ENCOUNTER — Ambulatory Visit (HOSPITAL_COMMUNITY)
Admission: RE | Admit: 2018-11-22 | Discharge: 2018-11-22 | Disposition: A | Discharge status: Home / Self Care | Payer: 59 | Source: Ambulatory Visit | Attending: Gastroenterology | Admitting: Gastroenterology

## 2018-11-22 DIAGNOSIS — R1013 Epigastric pain: Secondary | ICD-10-CM | POA: Diagnosis not present

## 2018-11-22 DIAGNOSIS — R1011 Right upper quadrant pain: Secondary | ICD-10-CM | POA: Diagnosis not present

## 2018-11-22 MED ORDER — TECHNETIUM TC 99M MEBROFENIN IV KIT
5.0000 | PACK | Freq: Once | INTRAVENOUS | Status: AC | PRN
  Administered 2018-11-22: 5 via INTRAVENOUS

## 2018-11-23 ENCOUNTER — Ambulatory Visit (INDEPENDENT_AMBULATORY_CARE_PROVIDER_SITE_OTHER): Payer: 59 | Admitting: Family Medicine

## 2018-11-23 ENCOUNTER — Encounter (INDEPENDENT_AMBULATORY_CARE_PROVIDER_SITE_OTHER): Payer: Self-pay | Admitting: Family Medicine

## 2018-11-23 DIAGNOSIS — E669 Obesity, unspecified: Secondary | ICD-10-CM | POA: Diagnosis not present

## 2018-11-23 DIAGNOSIS — I10 Essential (primary) hypertension: Secondary | ICD-10-CM

## 2018-11-23 DIAGNOSIS — Z6831 Body mass index (BMI) 31.0-31.9, adult: Secondary | ICD-10-CM

## 2018-11-24 NOTE — Progress Notes (Signed)
Office: [phone]  /  Fax: [phone] TeleHealth Visit:  [name] has verbally consented to this TeleHealth visit today. The patient is located at work, the provider is located at the [employer] [facility] office. The participants in this visit include the listed provider and patient. The visit was conducted today via FaceTime.  HPI:   Chief Complaint: OBESITY [name] is here to discuss her progress with her obesity treatment plan. She is keeping a food journal with 1300 calories and 85 grams of protein and is following her eating plan approximately 85% of the time. She states she is exercising 0 minutes 0 times per week. [name] has not weighed recently. 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 We were unable to weigh the patient today for this TeleHealth visit. She feels as if she has maintained her weight since her last visit. She has lost 0 lbs since starting treatment with [name].  Hypertension [name] is a 48 y.o. female with hypertension.  [name] denies chest pain or shortness of breath on exertion. She is working weight loss to help control her blood pressure with the goal of decreasing her risk of heart attack and stroke. [name] states she has not checked her blood pressure recently. Her blood pressure at her most recent office visits has been within normal limits. BP Readings from Last 3 Encounters:  08/30/18 123/84  08/09/18 122/84  07/20/18 136/78     ASSESSMENT AND PLAN:  Essential hypertension  Class 1 obesity with serious comorbidity and body mass index (BMI) of 31.0 to 31.9 in adult, unspecified obesity type  PLAN:  Hypertension We discussed sodium restriction, working on healthy weight loss, and a regular exercise program as the means to achieve improved blood pressure control. [name] agreed with this plan and agreed to follow up as directed. We will continue to monitor her  blood pressure as well as her progress with the above lifestyle modifications. She will continue HCTZ and Norvasc as prescribed and will watch for signs of hypotension as she continues her lifestyle modifications.  I spent > than 50% of the 15 minute visit on counseling as documented in the note.  Obesity [name] is currently in the action stage of change. As such, her goal is to continue with weight loss efforts. She has agreed to keep a food journal with 1300 calories and 85 grams of protein daily. [name] has been instructed to work up to a goal of 150 minutes of combined cardio and strengthening exercise per week for weight loss and overall health benefits. We discussed the following Behavioral Modification Strategies today: increasing lean protein intake, planning for success, and keep a strict food journal.  [name] has agreed to follow-up with our clinic in 2 weeks. She was informed of the importance of frequent follow-up visits to maximize her success with intensive lifestyle modifications for her multiple health conditions.  ALLERGIES: No Known Allergies  MEDICATIONS: Current Outpatient Medications on File Prior to Visit  Medication Sig Dispense Refill   amLODipine (NORVASC) 5 MG tablet Take 10 mg by mouth daily.     hydrochlorothiazide (HYDRODIURIL) 12.5 MG tablet Take 1 tablet (12.5 mg total) by mouth daily. (Patient not taking: Reported on 11/15/2018) 30 tablet 0   ibuprofen (ADVIL,MOTRIN) 400 MG tablet Take 1 tablet (400 mg total) by mouth 3 (three) times daily. (Patient taking differently: Take 400 mg by  mouth as needed. ) 30 tablet 0   Multiple Vitamin (MULTIVITAMIN) tablet Take 1 tablet by mouth daily.     omeprazole (PRILOSEC) 20 MG capsule Take 1 capsule (20 mg total) by mouth daily. (Patient not taking: Reported on 11/15/2018) 30 capsule 11   ondansetron (ZOFRAN ODT) 4 MG disintegrating tablet Take every 6-8 hours as needed. 20 tablet 1   pantoprazole (PROTONIX) 40 MG tablet  Take 1 tablet (40 mg total) by mouth daily. 30 tablet 6   SUMAtriptan (IMITREX) 50 MG tablet Take 50 mg by mouth every 2 (two) hours as needed for migraine. May repeat in 2 hours if headache persists or recurs.     Vitamin D, Ergocalciferol, (DRISDOL) 1.25 MG (50000 UT) CAPS capsule Take 1 capsule (50,000 Units total) by mouth every 7 (seven) days. 4 capsule 0   No current facility-administered medications on file prior to visit.     PAST MEDICAL HISTORY: Past Medical History:  Diagnosis Date   Anemia    Asthma    as child   Back pain    GERD (gastroesophageal reflux disease)    Headache    Hypertension    Joint pain    Migraines    Parathyroid abnormality (HCC)    Swelling    feet and legs   Vitamin D deficiency     PAST SURGICAL HISTORY: Past Surgical History:  Procedure Laterality Date   ESOPHAGOGASTRODUODENOSCOPY  09/13/2003   Normal EGD.    FOOT SURGERY Bilateral 2001   PARATHYROIDECTOMY N/A 01/03/2016   Procedure: PARATHYROIDECTOMY;  Surgeon: Leta Baptist, MD;  Location: [place];  Service: ENT;  Laterality: N/A;   TUBAL LIGATION      SOCIAL HISTORY: Social History   Tobacco Use   Smoking status: Never Smoker   Smokeless tobacco: Never Used  Substance Use Topics   Alcohol use: No   Drug use: No    FAMILY HISTORY: Family History  Problem Relation Age of Onset   Hypertension Father    Cancer Maternal Grandmother    Heart disease Paternal Grandmother    Cancer Paternal Grandmother    Diabetes Paternal Grandmother    Melanoma Maternal Grandfather    Colon cancer Neg Hx    Colon polyps Neg Hx    Esophageal cancer Neg Hx    Rectal cancer Neg Hx    Stomach cancer Neg Hx    ROS: Review of Systems  Respiratory: Negative for shortness of breath.   Cardiovascular: Negative for chest pain.   PHYSICAL EXAM: Pt in no acute distress  RECENT LABS AND TESTS: BMET    Component Value Date/Time   NA 141 07/13/2018  1006   K 4.4 07/13/2018 1006   CL 103 07/13/2018 1006   CO2 22 07/13/2018 1006   GLUCOSE 88 07/13/2018 1006   BUN 17 07/13/2018 1006   CREATININE 0.89 07/13/2018 1006   CALCIUM 9.1 07/13/2018 1006   CALCIUM 7.5 (L) 01/03/2016 1520   GFRNONAA 77 07/13/2018 1006   GFRAA 89 07/13/2018 1006   Lab Results  Component Value Date   HGBA1C 5.5 03/02/2018   HGBA1C 5.5 09/01/2017   HGBA1C 5.6 11/10/2016   HGBA1C 5.5 08/13/2016   Lab Results  Component Value Date   INSULIN 5.8 03/02/2018   INSULIN 3.7 09/01/2017   INSULIN 5.6 11/10/2016   INSULIN 7.9 08/13/2016   CBC    Component Value Date/Time   WBC 4.3 03/02/2018 0746   RBC 4.41 03/02/2018 0746   HGB  12.7 03/02/2018 0746   HCT 38.4 03/02/2018 0746   MCV 87 03/02/2018 0746   MCH 28.8 03/02/2018 0746   MCHC 33.1 03/02/2018 0746   RDW 12.5 03/02/2018 0746   LYMPHSABS 1.8 03/02/2018 0746   EOSABS 0.1 03/02/2018 0746   BASOSABS 0.0 03/02/2018 0746   Iron/TIBC/Ferritin/ %Sat No results found for: IRON, TIBC, FERRITIN, IRONPCTSAT Lipid Panel     Component Value Date/Time   CHOL 212 (H) 07/13/2018 1006   TRIG 50 07/13/2018 1006   HDL 75 07/13/2018 1006   LDLCALC 127 (H) 07/13/2018 1006   Hepatic Function Panel     Component Value Date/Time   PROT 7.0 07/13/2018 1006   ALBUMIN 4.4 07/13/2018 1006   AST 15 07/13/2018 1006   ALT 22 07/13/2018 1006   ALKPHOS 58 07/13/2018 1006   BILITOT 0.4 07/13/2018 1006      Component Value Date/Time   TSH 1.870 03/02/2018 0746   TSH 2.080 08/13/2016 0928   Results for Huitron, Armentha R (MRN [id number]) as of 11/24/2018 09:24  Ref. Range 07/13/2018 10:06  Vitamin D, 25-Hydroxy Latest Ref Range: 30.0 - 100.0 ng/mL 37.1    I, Michaelene Song, am acting as Location manager for [employer], FNP-C.  I have reviewed the above documentation for accuracy and completeness, and I agree with the above.  - Read Bonelli, FNP-C.

## 2018-11-29 ENCOUNTER — Encounter (INDEPENDENT_AMBULATORY_CARE_PROVIDER_SITE_OTHER): Payer: Self-pay | Admitting: Family Medicine

## 2018-12-07 ENCOUNTER — Ambulatory Visit (INDEPENDENT_AMBULATORY_CARE_PROVIDER_SITE_OTHER): Payer: 59 | Admitting: Bariatrics

## 2018-12-14 ENCOUNTER — Encounter (INDEPENDENT_AMBULATORY_CARE_PROVIDER_SITE_OTHER): Payer: Self-pay | Admitting: Family Medicine

## 2018-12-14 ENCOUNTER — Ambulatory Visit (INDEPENDENT_AMBULATORY_CARE_PROVIDER_SITE_OTHER): Payer: 59 | Admitting: Family Medicine

## 2018-12-14 ENCOUNTER — Other Ambulatory Visit: Payer: Self-pay

## 2018-12-14 DIAGNOSIS — E669 Obesity, unspecified: Secondary | ICD-10-CM | POA: Diagnosis not present

## 2018-12-14 DIAGNOSIS — I10 Essential (primary) hypertension: Secondary | ICD-10-CM | POA: Diagnosis not present

## 2018-12-14 DIAGNOSIS — Z6830 Body mass index (BMI) 30.0-30.9, adult: Secondary | ICD-10-CM | POA: Diagnosis not present

## 2018-12-15 NOTE — Progress Notes (Signed)
Office: [phone]  /  Fax: [phone] TeleHealth Visit:  [name] has verbally consented to this TeleHealth visit today. The patient is located at work, the provider is located at the [employer] [facility] office. The participants in this visit include the listed provider and patient. The visit was conducted today via Face Time.  HPI:   Chief Complaint: OBESITY [name] is here to discuss her progress with her obesity treatment plan. She is keep a food journal with 1300 calories and 80 grams of protein  and is following her eating plan approximately 85 % of the time. She states she is walking 30 minutes 3 times per week. [name] is sticking to the plan well. She feels that she is having fluid retention. She is meeting her calorie and protein goals daily. [name] does believe that she has lost some weight. Her goal weight is 170 pounds.  We were unable to weigh the patient today for this TeleHealth visit. She feels as if she has maintained weight since her last visit. She has lost 0 lbs since starting treatment with [name].  Hypertension [name] is a 48 y.o. female with hypertension. [name]'s blood pressure is currently well controlled. Amlodipine is causing swelling in her hands and ankles. She is also on HCTZ 12.5 mg every day. She is working on weight loss to help control her blood pressure with the goal of decreasing her risk of heart attack and stroke. [name] denies chest pain or shortness of breath.  BP Readings from Last 3 Encounters:  08/30/18 123/84  08/09/18 122/84  07/20/18 136/78    ASSESSMENT AND PLAN:  Essential hypertension  Class 1 obesity with serious comorbidity and body mass index (BMI) of 30.0 to 30.9 in adult, unspecified obesity type  PLAN:  Hypertension We discussed sodium restriction, working on healthy weight loss, and a regular exercise program as the means to achieve improved blood pressure control. We will continue to monitor her blood pressure as well as her  progress with the above lifestyle modifications. She will discontinue Norvasc and continue HCTZ as prescribed. She will watch for signs of hypotension as she continues her lifestyle modifications and check her blood pressure a few times per week at home or at work. [name] agreed with this plan and agreed to follow up as directed in 2 weeks.  I spent > than 50% of the 15 minute visit on counseling as documented in the note.  Obesity [name] is currently in the action stage of change. As such, her goal is to continue with weight loss efforts. She has agreed to keep a food journal with 1300 calories and 85 grams of protein daily. We discussed the following Behavioral Modification Strategies today: planning for success and keep a strict food journal. [name] will continue current exercise regimen for weight loss and overall health benefits. [name] has agreed to follow up with our clinic in 2 weeks. She was informed of the importance of frequent follow up visits to maximize her success with intensive lifestyle modifications for her multiple health conditions.  ALLERGIES: No Known Allergies  MEDICATIONS: Current Outpatient Medications on File Prior to Visit  Medication Sig Dispense Refill   hydrochlorothiazide (HYDRODIURIL) 12.5 MG tablet Take 1 tablet (12.5 mg total) by mouth daily. 30 tablet 0   ibuprofen (ADVIL,MOTRIN) 400 MG tablet Take 1 tablet (400 mg total) by mouth 3 (three) times daily. (Patient taking differently: Take 400 mg by mouth as needed. ) 30 tablet 0   Multiple  Vitamin (MULTIVITAMIN) tablet Take 1 tablet by mouth daily.     omeprazole (PRILOSEC) 20 MG capsule Take 1 capsule (20 mg total) by mouth daily. (Patient not taking: Reported on 11/15/2018) 30 capsule 11   ondansetron (ZOFRAN ODT) 4 MG disintegrating tablet Take every 6-8 hours as needed. 20 tablet 1   pantoprazole (PROTONIX) 40 MG tablet Take 1 tablet (40 mg total) by mouth daily. 30 tablet 6   SUMAtriptan (IMITREX) 50 MG  tablet Take 50 mg by mouth every 2 (two) hours as needed for migraine. May repeat in 2 hours if headache persists or recurs.     Vitamin D, Ergocalciferol, (DRISDOL) 1.25 MG (50000 UT) CAPS capsule Take 1 capsule (50,000 Units total) by mouth every 7 (seven) days. 4 capsule 0   No current facility-administered medications on file prior to visit.     PAST MEDICAL HISTORY: Past Medical History:  Diagnosis Date   Anemia    Asthma    as child   Back pain    GERD (gastroesophageal reflux disease)    Headache    Hypertension    Joint pain    Migraines    Parathyroid abnormality (HCC)    Swelling    feet and legs   Vitamin D deficiency     PAST SURGICAL HISTORY: Past Surgical History:  Procedure Laterality Date   ESOPHAGOGASTRODUODENOSCOPY  09/13/2003   Normal EGD.    FOOT SURGERY Bilateral 2001   PARATHYROIDECTOMY N/A 01/03/2016   Procedure: PARATHYROIDECTOMY;  Surgeon: Leta Baptist, MD;  Location: [facility];  Service: ENT;  Laterality: N/A;   TUBAL LIGATION      SOCIAL HISTORY: Social History   Tobacco Use   Smoking status: Never Smoker   Smokeless tobacco: Never Used  Substance Use Topics   Alcohol use: No   Drug use: No    FAMILY HISTORY: Family History  Problem Relation Age of Onset   Hypertension Father    Cancer Maternal Grandmother    Heart disease Paternal Grandmother    Cancer Paternal Grandmother    Diabetes Paternal Grandmother    Melanoma Maternal Grandfather    Colon cancer Neg Hx    Colon polyps Neg Hx    Esophageal cancer Neg Hx    Rectal cancer Neg Hx    Stomach cancer Neg Hx     ROS: Review of Systems  Respiratory: Negative for shortness of breath.   Cardiovascular: Negative for chest pain.       Positive for swelling in hands and ankles.    PHYSICAL EXAM: Pt in no acute distress  RECENT LABS AND TESTS: BMET    Component Value Date/Time   NA 141 07/13/2018 1006   K 4.4 07/13/2018 1006    CL 103 07/13/2018 1006   CO2 22 07/13/2018 1006   GLUCOSE 88 07/13/2018 1006   BUN 17 07/13/2018 1006   CREATININE 0.89 07/13/2018 1006   CALCIUM 9.1 07/13/2018 1006   CALCIUM 7.5 (L) 01/03/2016 1520   GFRNONAA 77 07/13/2018 1006   GFRAA 89 07/13/2018 1006   Lab Results  Component Value Date   HGBA1C 5.5 03/02/2018   HGBA1C 5.5 09/01/2017   HGBA1C 5.6 11/10/2016   HGBA1C 5.5 08/13/2016   Lab Results  Component Value Date   INSULIN 5.8 03/02/2018   INSULIN 3.7 09/01/2017   INSULIN 5.6 11/10/2016   INSULIN 7.9 08/13/2016   CBC    Component Value Date/Time   WBC 4.3 03/02/2018 0746   RBC 4.41  03/02/2018 0746   HGB 12.7 03/02/2018 0746   HCT 38.4 03/02/2018 0746   MCV 87 03/02/2018 0746   MCH 28.8 03/02/2018 0746   MCHC 33.1 03/02/2018 0746   RDW 12.5 03/02/2018 0746   LYMPHSABS 1.8 03/02/2018 0746   EOSABS 0.1 03/02/2018 0746   BASOSABS 0.0 03/02/2018 0746   Iron/TIBC/Ferritin/ %Sat No results found for: IRON, TIBC, FERRITIN, IRONPCTSAT Lipid Panel     Component Value Date/Time   CHOL 212 (H) 07/13/2018 1006   TRIG 50 07/13/2018 1006   HDL 75 07/13/2018 1006   LDLCALC 127 (H) 07/13/2018 1006   Hepatic Function Panel     Component Value Date/Time   PROT 7.0 07/13/2018 1006   ALBUMIN 4.4 07/13/2018 1006   AST 15 07/13/2018 1006   ALT 22 07/13/2018 1006   ALKPHOS 58 07/13/2018 1006   BILITOT 0.4 07/13/2018 1006      Component Value Date/Time   TSH 1.870 03/02/2018 0746   TSH 2.080 08/13/2016 0928   Results for Koors, Ercia R (MRN [id number]) as of 12/15/2018 14:32  Ref. Range 07/13/2018 10:06  Vitamin D, 25-Hydroxy Latest Ref Range: 30.0 - 100.0 ng/mL 37.1     I, Marcille Blanco, CMA, am acting as Location manager for [employer], FNP-C  I have reviewed the above documentation for accuracy and completeness, and I agree with the above.  - Merion Grimaldo, FNP-C.

## 2018-12-19 ENCOUNTER — Encounter (INDEPENDENT_AMBULATORY_CARE_PROVIDER_SITE_OTHER): Payer: Self-pay | Admitting: Family Medicine

## 2018-12-21 MED FILL — SUMAtriptan SUCCINATE 50 MG: 50 | 90 days supply | Qty: 27 | Fill #2

## 2018-12-27 ENCOUNTER — Encounter (INDEPENDENT_AMBULATORY_CARE_PROVIDER_SITE_OTHER): Payer: Self-pay

## 2018-12-28 ENCOUNTER — Encounter (INDEPENDENT_AMBULATORY_CARE_PROVIDER_SITE_OTHER): Payer: Self-pay | Admitting: Family Medicine

## 2018-12-28 ENCOUNTER — Telehealth (INDEPENDENT_AMBULATORY_CARE_PROVIDER_SITE_OTHER): Payer: 59 | Admitting: Family Medicine

## 2018-12-28 ENCOUNTER — Other Ambulatory Visit: Payer: Self-pay

## 2018-12-28 DIAGNOSIS — R3915 Urgency of urination: Secondary | ICD-10-CM | POA: Diagnosis not present

## 2018-12-28 DIAGNOSIS — N814 Uterovaginal prolapse, unspecified: Secondary | ICD-10-CM | POA: Diagnosis not present

## 2018-12-28 DIAGNOSIS — E559 Vitamin D deficiency, unspecified: Secondary | ICD-10-CM | POA: Diagnosis not present

## 2018-12-28 DIAGNOSIS — Z124 Encounter for screening for malignant neoplasm of cervix: Secondary | ICD-10-CM | POA: Diagnosis not present

## 2018-12-28 DIAGNOSIS — I10 Essential (primary) hypertension: Secondary | ICD-10-CM

## 2018-12-28 DIAGNOSIS — R32 Unspecified urinary incontinence: Secondary | ICD-10-CM | POA: Diagnosis not present

## 2018-12-28 DIAGNOSIS — Z6830 Body mass index (BMI) 30.0-30.9, adult: Secondary | ICD-10-CM | POA: Diagnosis not present

## 2018-12-28 DIAGNOSIS — N816 Rectocele: Secondary | ICD-10-CM | POA: Diagnosis not present

## 2018-12-28 DIAGNOSIS — N819 Female genital prolapse, unspecified: Secondary | ICD-10-CM | POA: Diagnosis not present

## 2018-12-28 DIAGNOSIS — E669 Obesity, unspecified: Secondary | ICD-10-CM

## 2018-12-28 DIAGNOSIS — N8110 Cystocele, unspecified: Secondary | ICD-10-CM | POA: Diagnosis not present

## 2018-12-29 NOTE — Progress Notes (Signed)
Office: [phone]  /  Fax: [phone] TeleHealth Visit:  [name] has verbally consented to this TeleHealth visit today. The patient is located at home, the provider is located at the [employer] [facility] office. The participants in this visit include the listed provider and patient. [name] was unable to use realtime audiovisual technology today and the telehealth visit was conducted via audio only.   HPI:   Chief Complaint: OBESITY [name] is here to discuss her progress with her obesity treatment plan. She is on the follow a lower carbohydrate, vegetable and lean protein rich diet plan and is following her eating plan approximately 95 % of the time. She states she is walking for 30-45 minutes 5 times per week. [name] is doing the low carbohydrate plan and feels she has lost weight. She started this on Monday (2 days ago).  She reports she likes the low carbohydrate plan and is sticking closely to it.  We were unable to weigh the patient today for this TeleHealth visit. She feels as if she has lost 1-2 lbs since her last visit. She has lost 0 lbs since starting treatment with [name].  Hypertension [name] is a 48 y.o. female with hypertension.. [name]'s blood pressures have been running in 130's/80's-90's. She states that the highest was 135/90 and today it was 133/89. Well controlled on hydrochlorothiazide and she denies chest pain or shortness of breath. She is working on weight loss to help control her blood pressure with the goal of decreasing her risk of heart attack and stroke.  BP Readings from Last 3 Encounters:  08/30/18 123/84  08/09/18 122/84  07/20/18 136/78    Vitamin D Deficiency [name] has a diagnosis of vitamin D deficiency. She is currently taking prescription Vit D, but level is not at goal. Last Vit D level was 37.1 on 07/13/2018. She denies nausea, vomiting or muscle weakness.  ASSESSMENT AND PLAN:  Essential hypertension  Vitamin D deficiency  Class 1 obesity  with serious comorbidity and body mass index (BMI) of 30.0 to 30.9 in adult, unspecified obesity type  PLAN:  Hypertension We discussed sodium restriction, working on healthy weight loss, and a regular exercise program as the means to achieve improved blood pressure control. [name] agreed with this plan and agreed to follow up as directed. We will continue to monitor her blood pressure as well as her progress with the above lifestyle modifications. [name] agrees to continue taking hydrochlorothiazide and will watch for signs of hypotension as she continues her lifestyle modifications. [name] agrees to follow up with our clinic in 2 weeks.  Vitamin D Deficiency [name] was informed that low vitamin D levels contributes to fatigue and are associated with obesity, breast, and colon cancer. [name] agrees to continue taking prescription Vit D 50,000 IU every week and will follow up for routine testing of vitamin D, at least 2-3 times per year. She was informed of the risk of over-replacement of vitamin D and agrees to not increase her dose unless she discusses this with [name] first. We will recheck labs at next visit. [name] agrees to follow up with our clinic in 2 weeks.  I spent > than 50% of the 15 minute visit on counseling as documented in the note.  Obesity [name] is currently in the action stage of change. As such, her goal is to continue with weight loss efforts She has agreed to keep a food journal with 1300 calories and 85 grams of protein daily [name] has  been instructed to work up to a goal of 150 minutes of combined cardio and strengthening exercise per week for weight loss and overall health benefits. We discussed the following Behavioral Modification Strategies today: increasing lean protein intake and planning for success   [name] has agreed to follow up with our clinic in 2 weeks. She was informed of the importance of frequent follow up visits to maximize her success with intensive lifestyle modifications  for her multiple health conditions.  ALLERGIES: No Known Allergies  MEDICATIONS: Current Outpatient Medications on File Prior to Visit  Medication Sig Dispense Refill  . hydrochlorothiazide (HYDRODIURIL) 12.5 MG tablet Take 1 tablet (12.5 mg total) by mouth daily. 30 tablet 0  . ibuprofen (ADVIL,MOTRIN) 400 MG tablet Take 1 tablet (400 mg total) by mouth 3 (three) times daily. (Patient taking differently: Take 400 mg by mouth as needed. ) 30 tablet 0  . Multiple Vitamin (MULTIVITAMIN) tablet Take 1 tablet by mouth daily.    Marland Kitchen omeprazole (PRILOSEC) 20 MG capsule Take 1 capsule (20 mg total) by mouth daily. 30 capsule 11  . ondansetron (ZOFRAN ODT) 4 MG disintegrating tablet Take every 6-8 hours as needed. 20 tablet 1  . pantoprazole (PROTONIX) 40 MG tablet Take 1 tablet (40 mg total) by mouth daily. 30 tablet 6  . SUMAtriptan (IMITREX) 50 MG tablet Take 50 mg by mouth every 2 (two) hours as needed for migraine. May repeat in 2 hours if headache persists or recurs.    . Vitamin D, Ergocalciferol, (DRISDOL) 1.25 MG (50000 UT) CAPS capsule Take 1 capsule (50,000 Units total) by mouth every 7 (seven) days. 4 capsule 0   No current facility-administered medications on file prior to visit.     PAST MEDICAL HISTORY: Past Medical History:  Diagnosis Date  . Anemia   . Asthma    as child  . Back pain   . GERD (gastroesophageal reflux disease)   . Headache   . Hypertension   . Joint pain   . Migraines   . Parathyroid abnormality ([place])   . Swelling    feet and legs  . Vitamin D deficiency     PAST SURGICAL HISTORY: Past Surgical History:  Procedure Laterality Date  . ESOPHAGOGASTRODUODENOSCOPY  09/13/2003   Normal EGD.   Marland Kitchen FOOT SURGERY Bilateral 2001  . PARATHYROIDECTOMY N/A 01/03/2016   Procedure: PARATHYROIDECTOMY;  Surgeon: Leta Baptist, MD;  Location: [facility];  Service: ENT;  Laterality: N/A;  . TUBAL LIGATION      SOCIAL HISTORY: Social History   Tobacco Use   . Smoking status: Never Smoker  . Smokeless tobacco: Never Used  Substance Use Topics  . Alcohol use: No  . Drug use: No    FAMILY HISTORY: Family History  Problem Relation Age of Onset  . Hypertension Father   . Cancer Maternal Grandmother   . Heart disease Paternal Grandmother   . Cancer Paternal Grandmother   . Diabetes Paternal Grandmother   . Melanoma Maternal Grandfather   . Colon cancer Neg Hx   . Colon polyps Neg Hx   . Esophageal cancer Neg Hx   . Rectal cancer Neg Hx   . Stomach cancer Neg Hx     ROS: Review of Systems  Constitutional: Positive for weight loss.  Respiratory: Negative for shortness of breath.   Cardiovascular: Negative for chest pain.  Gastrointestinal: Negative for nausea and vomiting.  Musculoskeletal:       Negative muscle weakness    PHYSICAL  EXAM: Pt in no acute distress  RECENT LABS AND TESTS: BMET    Component Value Date/Time   NA 141 07/13/2018 1006   K 4.4 07/13/2018 1006   CL 103 07/13/2018 1006   CO2 22 07/13/2018 1006   GLUCOSE 88 07/13/2018 1006   BUN 17 07/13/2018 1006   CREATININE 0.89 07/13/2018 1006   CALCIUM 9.1 07/13/2018 1006   CALCIUM 7.5 (L) 01/03/2016 1520   GFRNONAA 77 07/13/2018 1006   GFRAA 89 07/13/2018 1006   Lab Results  Component Value Date   HGBA1C 5.5 03/02/2018   HGBA1C 5.5 09/01/2017   HGBA1C 5.6 11/10/2016   HGBA1C 5.5 08/13/2016   Lab Results  Component Value Date   INSULIN 5.8 03/02/2018   INSULIN 3.7 09/01/2017   INSULIN 5.6 11/10/2016   INSULIN 7.9 08/13/2016   CBC    Component Value Date/Time   WBC 4.3 03/02/2018 0746   RBC 4.41 03/02/2018 0746   HGB 12.7 03/02/2018 0746   HCT 38.4 03/02/2018 0746   MCV 87 03/02/2018 0746   MCH 28.8 03/02/2018 0746   MCHC 33.1 03/02/2018 0746   RDW 12.5 03/02/2018 0746   LYMPHSABS 1.8 03/02/2018 0746   EOSABS 0.1 03/02/2018 0746   BASOSABS 0.0 03/02/2018 0746   Iron/TIBC/Ferritin/ %Sat No results found for: IRON, TIBC, FERRITIN,  IRONPCTSAT Lipid Panel     Component Value Date/Time   CHOL 212 (H) 07/13/2018 1006   TRIG 50 07/13/2018 1006   HDL 75 07/13/2018 1006   LDLCALC 127 (H) 07/13/2018 1006   Hepatic Function Panel     Component Value Date/Time   PROT 7.0 07/13/2018 1006   ALBUMIN 4.4 07/13/2018 1006   AST 15 07/13/2018 1006   ALT 22 07/13/2018 1006   ALKPHOS 58 07/13/2018 1006   BILITOT 0.4 07/13/2018 1006      Component Value Date/Time   TSH 1.870 03/02/2018 0746   TSH 2.080 08/13/2016 0928      I, Trixie Dredge, am acting as transcriptionist for [employer], FNP-C  I have reviewed the above documentation for accuracy and completeness, and I agree with the above.  - Keygan Dumond, FNP-C.

## 2019-01-11 ENCOUNTER — Telehealth (INDEPENDENT_AMBULATORY_CARE_PROVIDER_SITE_OTHER): Payer: 59 | Admitting: Family Medicine

## 2019-01-11 ENCOUNTER — Other Ambulatory Visit: Payer: Self-pay

## 2019-01-11 ENCOUNTER — Encounter (INDEPENDENT_AMBULATORY_CARE_PROVIDER_SITE_OTHER): Payer: Self-pay | Admitting: Family Medicine

## 2019-01-11 DIAGNOSIS — Z6830 Body mass index (BMI) 30.0-30.9, adult: Secondary | ICD-10-CM | POA: Diagnosis not present

## 2019-01-11 DIAGNOSIS — I10 Essential (primary) hypertension: Secondary | ICD-10-CM

## 2019-01-11 DIAGNOSIS — E559 Vitamin D deficiency, unspecified: Secondary | ICD-10-CM | POA: Diagnosis not present

## 2019-01-11 DIAGNOSIS — E669 Obesity, unspecified: Secondary | ICD-10-CM | POA: Diagnosis not present

## 2019-01-11 DIAGNOSIS — Z9189 Other specified personal risk factors, not elsewhere classified: Secondary | ICD-10-CM

## 2019-01-11 MED ORDER — VITAMIN D (ERGOCALCIFEROL) 1.25 MG (50000 UNIT) PO CAPS: 50000.0000 [IU] | ORAL_CAPSULE | ORAL | 0 refills | Status: DC

## 2019-01-11 MED ORDER — HYDROCHLOROTHIAZIDE 12.5 MG PO TABS: 12.5000 mg | ORAL_TABLET | Freq: Every day | ORAL | 0 refills | Status: DC

## 2019-01-12 MED FILL — HYDROCHLOROTHIAZIDE 12.5 MG: 12.5 | 30 days supply | Qty: 30 | Fill #0

## 2019-01-12 MED FILL — VIT D2 1.25 MG (50,000 UNIT: 1.25 MG | 28 days supply | Qty: 4 | Fill #0

## 2019-01-12 NOTE — Progress Notes (Signed)
Office: [phone]  /  Fax: [phone] TeleHealth Visit:  [name] has verbally consented to this TeleHealth visit today. The patient is located in her car, the provider is located at the [facility] office. The participants in this visit include the listed provider and patient and any and all parties involved. The visit was conducted today via telephone. [name] was unable to use realtime audiovisual technology today (FaceTime failed) and the telehealth visit was conducted via telephone.  HPI:   Chief Complaint: OBESITY [name] is here to discuss her progress with her obesity treatment plan. She is on the lower carbohydrate, vegetable and lean protein rich diet plan and is following her eating plan approximately 90 % of the time. She states she is walking 40 minutes 5 times per week. [name] has done well with her low carb plan and she thinks she has continued to lose weight. [name] is walking during lunch, at work. She likes her low carb plan and she notes it is easier to follow now that there are not drug rep lunches at work. We were unable to weigh the patient today for this TeleHealth visit. She feels as if she has lost weight since her last visit. She has lost 0 lbs since starting treatment with [name].  Hypertension IAN CAVEY is a 48 y.o. female with hypertension. Her blood pressure is stable on HCTZ. Her systolic blood pressure is sometimes as low as 110 to 115. [name] has been walking in the noon day heat, but she hasn't increased her water. [name] denies chest pain or shortness of breath on exertion. She is working weight loss to help control her blood pressure with the goal of decreasing her risk of heart attack and stroke. [name] blood pressure is currently controlled.  Vitamin D deficiency [name] has a diagnosis of vitamin D deficiency. [name] is stable on vit D and she denies nausea, vomiting or muscle weakness.  ASSESSMENT AND PLAN:  Essential hypertension - Plan:  hydrochlorothiazide (HYDRODIURIL) 12.5 MG tablet  Vitamin D deficiency - Plan: Vitamin D, Ergocalciferol, (DRISDOL) 1.25 MG (50000 UT) CAPS capsule  At risk for dehydration  Class 1 obesity with serious comorbidity and body mass index (BMI) of 30.0 to 30.9 in adult, unspecified obesity type  PLAN:  Hypertension We discussed sodium restriction, working on healthy weight loss, and a regular exercise program as the means to achieve improved blood pressure control. [name] agreed with this plan and agreed to follow up as directed. We will continue to monitor her blood pressure as well as her progress with the above lifestyle modifications. [name] agrees to continue HCTZ 12.5 mg #30 with no refills and increase water intake to 80 to 100 ounces of water daily. She will watch for signs of hypotension as she continues her lifestyle modifications.  Vitamin D Deficiency [name] was informed that low vitamin D levels contributes to fatigue and are associated with obesity, breast, and colon cancer. She agrees to continue to take prescription Vit D @50 ,000 IU every week #4 with no refills and will follow up for routine testing of vitamin D, at least 2-3 times per year. She was informed of the risk of over-replacement of vitamin D and agrees to not increase her dose unless she discusses this with [name] first. [name] agrees to follow up as directed.  Obesity [name] is currently in the action stage of change. As such, her goal is to continue with weight loss efforts She has agreed to follow  the lower carbohydrate, vegetable and lean protein rich diet plan [name] has been instructed to work up to a goal of 150 minutes of combined cardio and strengthening exercise per week for weight loss and overall health benefits. We discussed the following Behavioral Modification Strategies today: increase H2O intake, increasing vegetables and dealing with family or coworker sabotage  [name] has agreed to follow up with our clinic in 3 weeks.  She was informed of the importance of frequent follow up visits to maximize her success with intensive lifestyle modifications for her multiple health conditions.  I spent > than 50% of the 25 minute visit on counseling as documented in the note.   ALLERGIES: No Known Allergies  MEDICATIONS: Current Outpatient Medications on File Prior to Visit  Medication Sig Dispense Refill  . ibuprofen (ADVIL,MOTRIN) 400 MG tablet Take 1 tablet (400 mg total) by mouth 3 (three) times daily. (Patient taking differently: Take 400 mg by mouth as needed. ) 30 tablet 0  . Multiple Vitamin (MULTIVITAMIN) tablet Take 1 tablet by mouth daily.    Marland Kitchen omeprazole (PRILOSEC) 20 MG capsule Take 1 capsule (20 mg total) by mouth daily. 30 capsule 11  . ondansetron (ZOFRAN ODT) 4 MG disintegrating tablet Take every 6-8 hours as needed. 20 tablet 1  . pantoprazole (PROTONIX) 40 MG tablet Take 1 tablet (40 mg total) by mouth daily. 30 tablet 6  . SUMAtriptan (IMITREX) 50 MG tablet Take 50 mg by mouth every 2 (two) hours as needed for migraine. May repeat in 2 hours if headache persists or recurs.     No current facility-administered medications on file prior to visit.     PAST MEDICAL HISTORY: Past Medical History:  Diagnosis Date  . Anemia   . Asthma    as child  . Back pain   . GERD (gastroesophageal reflux disease)   . Headache   . Hypertension   . Joint pain   . Migraines   . Parathyroid abnormality ([place])   . Swelling    feet and legs  . Vitamin D deficiency     PAST SURGICAL HISTORY: Past Surgical History:  Procedure Laterality Date  . ESOPHAGOGASTRODUODENOSCOPY  09/13/2003   Normal EGD.   Marland Kitchen FOOT SURGERY Bilateral 2001  . PARATHYROIDECTOMY N/A 01/03/2016   Procedure: PARATHYROIDECTOMY;  Surgeon: Leta Baptist, MD;  Location: [facility];  Service: ENT;  Laterality: N/A;  . TUBAL LIGATION      SOCIAL HISTORY: Social History   Tobacco Use  . Smoking status: Never Smoker  . Smokeless  tobacco: Never Used  Substance Use Topics  . Alcohol use: No  . Drug use: No    FAMILY HISTORY: Family History  Problem Relation Age of Onset  . Hypertension Father   . Cancer Maternal Grandmother   . Heart disease Paternal Grandmother   . Cancer Paternal Grandmother   . Diabetes Paternal Grandmother   . Melanoma Maternal Grandfather   . Colon cancer Neg Hx   . Colon polyps Neg Hx   . Esophageal cancer Neg Hx   . Rectal cancer Neg Hx   . Stomach cancer Neg Hx     ROS: Review of Systems  Constitutional: Positive for weight loss.  Respiratory: Negative for shortness of breath (on exertion).   Cardiovascular: Negative for chest pain.  Gastrointestinal: Negative for nausea and vomiting.  Musculoskeletal:       Negative for muscle weakness    PHYSICAL EXAM: Pt in no acute distress  RECENT LABS  AND TESTS: BMET    Component Value Date/Time   NA 141 07/13/2018 1006   K 4.4 07/13/2018 1006   CL 103 07/13/2018 1006   CO2 22 07/13/2018 1006   GLUCOSE 88 07/13/2018 1006   BUN 17 07/13/2018 1006   CREATININE 0.89 07/13/2018 1006   CALCIUM 9.1 07/13/2018 1006   CALCIUM 7.5 (L) 01/03/2016 1520   GFRNONAA 77 07/13/2018 1006   GFRAA 89 07/13/2018 1006   Lab Results  Component Value Date   HGBA1C 5.5 03/02/2018   HGBA1C 5.5 09/01/2017   HGBA1C 5.6 11/10/2016   HGBA1C 5.5 08/13/2016   Lab Results  Component Value Date   INSULIN 5.8 03/02/2018   INSULIN 3.7 09/01/2017   INSULIN 5.6 11/10/2016   INSULIN 7.9 08/13/2016   CBC    Component Value Date/Time   WBC 4.3 03/02/2018 0746   RBC 4.41 03/02/2018 0746   HGB 12.7 03/02/2018 0746   HCT 38.4 03/02/2018 0746   MCV 87 03/02/2018 0746   MCH 28.8 03/02/2018 0746   MCHC 33.1 03/02/2018 0746   RDW 12.5 03/02/2018 0746   LYMPHSABS 1.8 03/02/2018 0746   EOSABS 0.1 03/02/2018 0746   BASOSABS 0.0 03/02/2018 0746   Iron/TIBC/Ferritin/ %Sat No results found for: IRON, TIBC, FERRITIN, IRONPCTSAT Lipid Panel      Component Value Date/Time   CHOL 212 (H) 07/13/2018 1006   TRIG 50 07/13/2018 1006   HDL 75 07/13/2018 1006   LDLCALC 127 (H) 07/13/2018 1006   Hepatic Function Panel     Component Value Date/Time   PROT 7.0 07/13/2018 1006   ALBUMIN 4.4 07/13/2018 1006   AST 15 07/13/2018 1006   ALT 22 07/13/2018 1006   ALKPHOS 58 07/13/2018 1006   BILITOT 0.4 07/13/2018 1006      Component Value Date/Time   TSH 1.870 03/02/2018 0746   TSH 2.080 08/13/2016 0928     Ref. Range 07/13/2018 10:06  Vitamin D, 25-Hydroxy Latest Ref Range: 30.0 - 100.0 ng/mL 37.1    I, Doreene Nest, am acting as Location manager for Dennard Nip, MD I have reviewed the above documentation for accuracy and completeness, and I agree with the above. -Dennard Nip, MD

## 2019-01-25 DIAGNOSIS — N8110 Cystocele, unspecified: Secondary | ICD-10-CM | POA: Diagnosis not present

## 2019-01-25 DIAGNOSIS — R3915 Urgency of urination: Secondary | ICD-10-CM | POA: Diagnosis not present

## 2019-01-25 DIAGNOSIS — N816 Rectocele: Secondary | ICD-10-CM | POA: Diagnosis not present

## 2019-01-25 DIAGNOSIS — N814 Uterovaginal prolapse, unspecified: Secondary | ICD-10-CM | POA: Diagnosis not present

## 2019-02-06 ENCOUNTER — Ambulatory Visit (INDEPENDENT_AMBULATORY_CARE_PROVIDER_SITE_OTHER): Payer: 59 | Admitting: Family Medicine

## 2019-02-06 ENCOUNTER — Encounter (INDEPENDENT_AMBULATORY_CARE_PROVIDER_SITE_OTHER): Payer: Self-pay | Admitting: Family Medicine

## 2019-02-06 ENCOUNTER — Other Ambulatory Visit: Payer: Self-pay

## 2019-02-06 VITALS — BP 119/82 | HR 68 | Temp 97.9°F | Ht 66.0 in | Wt 191.0 lb

## 2019-02-06 DIAGNOSIS — F3289 Other specified depressive episodes: Secondary | ICD-10-CM

## 2019-02-06 DIAGNOSIS — E559 Vitamin D deficiency, unspecified: Secondary | ICD-10-CM | POA: Diagnosis not present

## 2019-02-06 DIAGNOSIS — Z9189 Other specified personal risk factors, not elsewhere classified: Secondary | ICD-10-CM

## 2019-02-06 DIAGNOSIS — Z6830 Body mass index (BMI) 30.0-30.9, adult: Secondary | ICD-10-CM | POA: Diagnosis not present

## 2019-02-06 DIAGNOSIS — E669 Obesity, unspecified: Secondary | ICD-10-CM | POA: Diagnosis not present

## 2019-02-06 MED ORDER — BUPROPION HCL ER (SR) 150 MG PO TB12: 150.0000 mg | ORAL_TABLET | Freq: Every day | ORAL | 0 refills | Status: DC

## 2019-02-06 MED ORDER — VITAMIN D (ERGOCALCIFEROL) 1.25 MG (50000 UNIT) PO CAPS: 50000.0000 [IU] | ORAL_CAPSULE | ORAL | 0 refills | Status: DC

## 2019-02-06 MED FILL — VIT D2 1.25 MG (50,000 UNIT: 1.25 MG | 28 days supply | Qty: 4 | Fill #0

## 2019-02-06 MED FILL — BUPROPION HCL SR 150 MG TAB: 150 | 30 days supply | Qty: 30 | Fill #0

## 2019-02-06 NOTE — Progress Notes (Signed)
Office: [phone]  /  Fax: [phone]   HPI:   Chief Complaint: OBESITY [name] is here to discuss her progress with her obesity treatment plan. She is on the lower carbohydrate, vegetable and lean protein rich diet plan and is following her eating plan approximately 95 % of the time. She states she is walking for 30-60 minutes 5 times per week. [name] continues to do well with weight loss even during the COVID-19 pandemic. She is exercising regularly and states her hunger is controlled.  Her weight is 191 lb (86.6 kg) today and has had a weight loss of 1 pounds over a period of 5 to 6 months since her last visit. She has lost 0 lbs since starting treatment with [name].  Vitamin D Deficiency [name] has a diagnosis of vitamin D deficiency. She is stable on prescription Vit D and she is due for labs. She denies nausea, vomiting or muscle weakness.  At risk for osteopenia and osteoporosis [name] is at higher risk of osteopenia and osteoporosis due to vitamin D deficiency.   Depression with Emotional Eating Behaviors [name] notes increased work stress while working in the [employer] during COVID-19. She notes increased irritability and decreased sleep. She has no history of seizures. [name] struggles with emotional eating and using food for comfort to the extent that it is negatively impacting her health. She often snacks when she is not hungry. [name] sometimes feels she is out of control and then feels guilty that she made poor food choices. She has been working on behavior modification techniques to help reduce her emotional eating and has been somewhat successful. She shows no sign of suicidal or homicidal ideations.  Depression screen PHQ 2/9 08/13/2016  Decreased Interest 1  Down, Depressed, Hopeless 2  PHQ - 2 Score 3  Altered sleeping 1  Tired, decreased energy 3  Change in appetite 3  Feeling bad or failure about yourself  1  Trouble concentrating 1  Moving slowly or fidgety/restless 1   Suicidal thoughts 0  PHQ-9 Score 13    ASSESSMENT AND PLAN:  Vitamin D deficiency - Plan: Comprehensive metabolic panel, Hemoglobin A1c, Insulin, random, Lipid Panel With LDL/HDL Ratio, VITAMIN D 25 Hydroxy (Vit-D Deficiency, Fractures), Vitamin B12, Vitamin D, Ergocalciferol, (DRISDOL) 1.25 MG (50000 UT) CAPS capsule  Other depression - with emotional eating  - Plan: Comprehensive metabolic panel, Hemoglobin A1c, Insulin, random, Lipid Panel With LDL/HDL Ratio, VITAMIN D 25 Hydroxy (Vit-D Deficiency, Fractures), Vitamin B12, buPROPion (WELLBUTRIN SR) 150 MG 12 hr tablet  At risk for osteoporosis  Class 1 obesity with serious comorbidity and body mass index (BMI) of 30.0 to 30.9 in adult, unspecified obesity type  PLAN:  Vitamin D Deficiency [name] was informed that low vitamin D levels contributes to fatigue and are associated with obesity, breast, and colon cancer. [name] agrees to continue taking prescription Vit D 50,000 IU every week #4 and we will refill for 1 month. She will follow up for routine testing of vitamin D, at least 2-3 times per year. She was informed of the risk of over-replacement of vitamin D and agrees to not increase her dose unless she discusses this with [name] first. We will check labs today. [name] agrees to follow up with our clinic in 2 weeks.  At risk for osteopenia and osteoporosis [name] was given extended (15 minutes) osteoporosis prevention counseling today. [name] is at risk for osteopenia and osteoporsis due to her vitamin D deficiency. She was encouraged to take her vitamin  D and follow her higher calcium diet and increase strengthening exercise to help strengthen her bones and decrease her risk of osteopenia and osteoporosis.  Depression with Emotional Eating Behaviors We discussed behavior modification techniques today to help [name] deal with her emotional eating and depression. [name] agrees to start Wellbutrin SR 150 mg q AM #30 with no refills. [name] agrees to follow  up with our clinic in 2 weeks.  Obesity [name] is currently in the action stage of change. As such, her goal is to continue with weight loss efforts She has agreed to follow a lower carbohydrate, vegetable and lean protein rich diet plan [name] has been instructed to work up to a goal of 150 minutes of combined cardio and strengthening exercise per week for weight loss and overall health benefits. We discussed the following Behavioral Modification Strategies today: increasing lean protein intake, decreasing simple carbohydrates  and work on meal planning and easy cooking plans   [name] has agreed to follow up with our clinic in 2 weeks. She was informed of the importance of frequent follow up visits to maximize her success with intensive lifestyle modifications for her multiple health conditions.  ALLERGIES: No Known Allergies  MEDICATIONS: Current Outpatient Medications on File Prior to Visit  Medication Sig Dispense Refill  . hydrochlorothiazide (HYDRODIURIL) 12.5 MG tablet Take 1 tablet (12.5 mg total) by mouth daily. 30 tablet 0  . ibuprofen (ADVIL,MOTRIN) 400 MG tablet Take 1 tablet (400 mg total) by mouth 3 (three) times daily. (Patient taking differently: Take 400 mg by mouth as needed. ) 30 tablet 0  . Multiple Vitamin (MULTIVITAMIN) tablet Take 1 tablet by mouth daily.    . ondansetron (ZOFRAN ODT) 4 MG disintegrating tablet Take every 6-8 hours as needed. 20 tablet 1  . SUMAtriptan (IMITREX) 50 MG tablet Take 50 mg by mouth every 2 (two) hours as needed for migraine. May repeat in 2 hours if headache persists or recurs.     No current facility-administered medications on file prior to visit.     PAST MEDICAL HISTORY: Past Medical History:  Diagnosis Date  . Anemia   . Asthma    as child  . Back pain   . GERD (gastroesophageal reflux disease)   . Headache   . Hypertension   . Joint pain   . Migraines   . Parathyroid abnormality ([place])   . Swelling    feet and legs  .  Vitamin D deficiency     PAST SURGICAL HISTORY: Past Surgical History:  Procedure Laterality Date  . ESOPHAGOGASTRODUODENOSCOPY  09/13/2003   Normal EGD.   Marland Kitchen FOOT SURGERY Bilateral 2001  . PARATHYROIDECTOMY N/A 01/03/2016   Procedure: PARATHYROIDECTOMY;  Surgeon: Leta Baptist, MD;  Location: [facility];  Service: ENT;  Laterality: N/A;  . TUBAL LIGATION      SOCIAL HISTORY: Social History   Tobacco Use  . Smoking status: Never Smoker  . Smokeless tobacco: Never Used  Substance Use Topics  . Alcohol use: No  . Drug use: No    FAMILY HISTORY: Family History  Problem Relation Age of Onset  . Hypertension Father   . Cancer Maternal Grandmother   . Heart disease Paternal Grandmother   . Cancer Paternal Grandmother   . Diabetes Paternal Grandmother   . Melanoma Maternal Grandfather   . Colon cancer Neg Hx   . Colon polyps Neg Hx   . Esophageal cancer Neg Hx   . Rectal cancer Neg Hx   .  Stomach cancer Neg Hx     ROS: Review of Systems  Constitutional: Positive for weight loss.  Gastrointestinal: Negative for nausea and vomiting.  Musculoskeletal:       Negative muscle weakness  Neurological: Negative for seizures.  Psychiatric/Behavioral: Positive for depression. Negative for suicidal ideas.    PHYSICAL EXAM: Blood pressure 119/82, pulse 68, temperature 97.9 F (36.6 C), temperature source Oral, height 5\' 6"  (1.676 m), weight 191 lb (86.6 kg), SpO2 97 %. Body mass index is 30.83 kg/m. Physical Exam Vitals signs reviewed.  Constitutional:      Appearance: Normal appearance. She is obese.  Cardiovascular:     Rate and Rhythm: Normal rate.     Pulses: Normal pulses.  Pulmonary:     Effort: Pulmonary effort is normal.     Breath sounds: Normal breath sounds.  Musculoskeletal: Normal range of motion.  Skin:    General: Skin is warm and dry.  Neurological:     Mental Status: She is alert and oriented to person, place, and time.  Psychiatric:         Mood and Affect: Mood normal.        Behavior: Behavior normal.     RECENT LABS AND TESTS: BMET    Component Value Date/Time   NA 141 07/13/2018 1006   K 4.4 07/13/2018 1006   CL 103 07/13/2018 1006   CO2 22 07/13/2018 1006   GLUCOSE 88 07/13/2018 1006   BUN 17 07/13/2018 1006   CREATININE 0.89 07/13/2018 1006   CALCIUM 9.1 07/13/2018 1006   CALCIUM 7.5 (L) 01/03/2016 1520   GFRNONAA 77 07/13/2018 1006   GFRAA 89 07/13/2018 1006   Lab Results  Component Value Date   HGBA1C 5.5 03/02/2018   HGBA1C 5.5 09/01/2017   HGBA1C 5.6 11/10/2016   HGBA1C 5.5 08/13/2016   Lab Results  Component Value Date   INSULIN 5.8 03/02/2018   INSULIN 3.7 09/01/2017   INSULIN 5.6 11/10/2016   INSULIN 7.9 08/13/2016   CBC    Component Value Date/Time   WBC 4.3 03/02/2018 0746   RBC 4.41 03/02/2018 0746   HGB 12.7 03/02/2018 0746   HCT 38.4 03/02/2018 0746   MCV 87 03/02/2018 0746   MCH 28.8 03/02/2018 0746   MCHC 33.1 03/02/2018 0746   RDW 12.5 03/02/2018 0746   LYMPHSABS 1.8 03/02/2018 0746   EOSABS 0.1 03/02/2018 0746   BASOSABS 0.0 03/02/2018 0746   Iron/TIBC/Ferritin/ %Sat No results found for: IRON, TIBC, FERRITIN, IRONPCTSAT Lipid Panel     Component Value Date/Time   CHOL 212 (H) 07/13/2018 1006   TRIG 50 07/13/2018 1006   HDL 75 07/13/2018 1006   LDLCALC 127 (H) 07/13/2018 1006   Hepatic Function Panel     Component Value Date/Time   PROT 7.0 07/13/2018 1006   ALBUMIN 4.4 07/13/2018 1006   AST 15 07/13/2018 1006   ALT 22 07/13/2018 1006   ALKPHOS 58 07/13/2018 1006   BILITOT 0.4 07/13/2018 1006      Component Value Date/Time   TSH 1.870 03/02/2018 0746   TSH 2.080 08/13/2016 0928      OBESITY BEHAVIORAL INTERVENTION VISIT  Today's visit was # 24   Starting weight: 188 lbs Starting date: 08/13/16 Today's weight : 191 lbs Today's date: 02/06/2019 Total lbs lost to date: 0    ASK: We discussed the diagnosis of obesity with [name] today and Ashaunte  agreed to give [name] permission to discuss obesity behavioral modification therapy today.  ASSESS: Juliya has the diagnosis of obesity and her BMI today is 30.84 Sanoe is in the action stage of change   ADVISE: Reana was educated on the multiple health risks of obesity as well as the benefit of weight loss to improve her health. She was advised of the need for long term treatment and the importance of lifestyle modifications to improve her current health and to decrease her risk of future health problems.  AGREE: Multiple dietary modification options and treatment options were discussed and  Khrystina agreed to follow the recommendations documented in the above note.  ARRANGE: Suzanne was educated on the importance of frequent visits to treat obesity as outlined per CMS and USPSTF guidelines and agreed to schedule her next follow up appointment today.  I, Trixie Dredge, am acting as transcriptionist for Dennard Nip, MD I have reviewed the above documentation for accuracy and completeness, and I agree with the above. -Dennard Nip, MD

## 2019-02-07 LAB — LIPID PANEL WITH LDL/HDL RATIO
Cholesterol, Total: 205 mg/dL — ABNORMAL HIGH (ref 100–199)
HDL: 56 mg/dL (ref >39)
LDL Calculated: 132 mg/dL — ABNORMAL HIGH (ref 0–99)
LDl/HDL Ratio: 2.4 ratio (ref 0.0–3.2)
Triglycerides: 87 mg/dL (ref 0–149)
VLDL Cholesterol Cal: 17 mg/dL (ref 5–40)

## 2019-02-07 LAB — COMPREHENSIVE METABOLIC PANEL
ALT: 12 IU/L (ref 0–32)
AST: 16 IU/L (ref 0–40)
Albumin/Globulin Ratio: 1.7 (ref 1.2–2.2)
Albumin: 4.3 g/dL (ref 3.8–4.8)
Alkaline Phosphatase: 61 IU/L (ref 39–117)
BUN/Creatinine Ratio: 16 (ref 9–23)
BUN: 15 mg/dL (ref 6–24)
Bilirubin Total: 0.3 mg/dL (ref 0.0–1.2)
CO2: 22 mmol/L (ref 20–29)
Calcium: 9 mg/dL (ref 8.7–10.2)
Chloride: 100 mmol/L (ref 96–106)
Creatinine, Ser: 0.94 mg/dL (ref 0.57–1.00)
GFR calc Af Amer: 83 mL/min/{1.73_m2} (ref >59)
GFR calc non Af Amer: 72 mL/min/{1.73_m2} (ref >59)
Globulin, Total: 2.5 g/dL (ref 1.5–4.5)
Glucose: 88 mg/dL (ref 65–99)
Potassium: 4.2 mmol/L (ref 3.5–5.2)
Sodium: 139 mmol/L (ref 134–144)
Total Protein: 6.8 g/dL (ref 6.0–8.5)

## 2019-02-07 LAB — INSULIN, RANDOM: INSULIN: 5.6 u[IU]/mL (ref 2.6–24.9)

## 2019-02-07 LAB — HEMOGLOBIN A1C
Est. average glucose Bld gHb Est-mCnc: 117 mg/dL
Hgb A1c MFr Bld: 5.7 % — ABNORMAL HIGH (ref 4.8–5.6)

## 2019-02-07 LAB — VITAMIN B12: Vitamin B-12: 337 pg/mL (ref 232–1245)

## 2019-02-07 LAB — VITAMIN D 25 HYDROXY (VIT D DEFICIENCY, FRACTURES): Vit D, 25-Hydroxy: 53.8 ng/mL (ref 30.0–100.0)

## 2019-02-16 ENCOUNTER — Telehealth (INDEPENDENT_AMBULATORY_CARE_PROVIDER_SITE_OTHER): Payer: 59 | Admitting: Family Medicine

## 2019-02-16 ENCOUNTER — Encounter (INDEPENDENT_AMBULATORY_CARE_PROVIDER_SITE_OTHER): Payer: Self-pay | Admitting: Family Medicine

## 2019-02-16 ENCOUNTER — Other Ambulatory Visit: Payer: Self-pay

## 2019-02-16 DIAGNOSIS — E669 Obesity, unspecified: Secondary | ICD-10-CM

## 2019-02-16 DIAGNOSIS — E66811 Obesity, class 1: Secondary | ICD-10-CM

## 2019-02-16 DIAGNOSIS — I10 Essential (primary) hypertension: Secondary | ICD-10-CM | POA: Diagnosis not present

## 2019-02-16 DIAGNOSIS — Z6830 Body mass index (BMI) 30.0-30.9, adult: Secondary | ICD-10-CM

## 2019-02-16 MED ORDER — HYDROCHLOROTHIAZIDE 12.5 MG PO TABS: 12.5000 mg | ORAL_TABLET | Freq: Every day | ORAL | 0 refills | Status: DC

## 2019-02-16 MED FILL — HYDROCHLOROTHIAZIDE 12.5 MG: 12.5 | 30 days supply | Qty: 30 | Fill #0

## 2019-02-20 DIAGNOSIS — Z20822 Contact with and (suspected) exposure to covid-19: Secondary | ICD-10-CM | POA: Insufficient documentation

## 2019-02-20 DIAGNOSIS — Z20828 Contact with and (suspected) exposure to other viral communicable diseases: Secondary | ICD-10-CM | POA: Diagnosis not present

## 2019-02-20 DIAGNOSIS — Z0000 Encounter for general adult medical examination without abnormal findings: Secondary | ICD-10-CM | POA: Insufficient documentation

## 2019-02-20 DIAGNOSIS — Z1159 Encounter for screening for other viral diseases: Secondary | ICD-10-CM | POA: Diagnosis not present

## 2019-02-20 DIAGNOSIS — N8110 Cystocele, unspecified: Secondary | ICD-10-CM | POA: Diagnosis not present

## 2019-02-20 DIAGNOSIS — N814 Uterovaginal prolapse, unspecified: Secondary | ICD-10-CM | POA: Diagnosis not present

## 2019-02-20 DIAGNOSIS — Z01812 Encounter for preprocedural laboratory examination: Secondary | ICD-10-CM | POA: Diagnosis not present

## 2019-02-20 HISTORY — DX: Contact with and (suspected) exposure to covid-19: Z20.822

## 2019-02-20 HISTORY — DX: Encounter for general adult medical examination without abnormal findings: Z00.00

## 2019-02-23 DIAGNOSIS — D251 Intramural leiomyoma of uterus: Secondary | ICD-10-CM | POA: Diagnosis not present

## 2019-02-23 DIAGNOSIS — N72 Inflammatory disease of cervix uteri: Secondary | ICD-10-CM | POA: Diagnosis not present

## 2019-02-23 DIAGNOSIS — N814 Uterovaginal prolapse, unspecified: Secondary | ICD-10-CM | POA: Diagnosis not present

## 2019-02-23 DIAGNOSIS — N813 Complete uterovaginal prolapse: Secondary | ICD-10-CM | POA: Diagnosis not present

## 2019-02-23 DIAGNOSIS — N819 Female genital prolapse, unspecified: Secondary | ICD-10-CM | POA: Diagnosis not present

## 2019-02-23 DIAGNOSIS — Z79899 Other long term (current) drug therapy: Secondary | ICD-10-CM | POA: Diagnosis not present

## 2019-02-23 DIAGNOSIS — N816 Rectocele: Secondary | ICD-10-CM | POA: Diagnosis not present

## 2019-02-23 DIAGNOSIS — R3915 Urgency of urination: Secondary | ICD-10-CM | POA: Diagnosis not present

## 2019-02-23 DIAGNOSIS — N8110 Cystocele, unspecified: Secondary | ICD-10-CM | POA: Diagnosis not present

## 2019-02-23 NOTE — Progress Notes (Signed)
Office: [phone]  /  Fax: [phone] TeleHealth Visit:  Dresden Bokhari Skaggs has verbally consented to this TeleHealth visit today. The patient is located at home, the provider is located at the [facility] office. The participants in this visit include the listed provider and patient. The visit was conducted today via doxy. me.  HPI:   Chief Complaint: OBESITY Katonya is here to discuss her progress with her obesity treatment plan. She is on the lower carbohydrate, vegetable and lean protein rich diet plan and is following her eating plan approximately 95 % of the time. She states she is walking for 30 minutes 5 times per week. Evanjelina feels she is doing well on her low carbohydrate plan, and she has lost another lb since her last visit. She feels this is a good plan for her overall, but she hasn't been able to drink as much H20 in the daytime since maintaining masks at work.  We were unable to weigh the patient today for this TeleHealth visit. She feels as if she has lost 1 lb since her last visit. She has lost 0 lbs since starting treatment with [name].  Hypertension CAIRRA BADOLATO is a 48 y.o. female with hypertension. Breece states her blood pressure range at 120's over 85-90 when she checks it at home. She denies chest pain, headaches, or lightheadedness. She is working on weight loss to help control her blood pressure with the goal of decreasing her risk of heart attack and stroke. Johnice's blood pressure is currently controlled.  ASSESSMENT AND PLAN:  Class 1 obesity with serious comorbidity and body mass index (BMI) of 30.0 to 30.9 in adult, unspecified obesity type  Essential hypertension - Plan: hydrochlorothiazide (HYDRODIURIL) 12.5 MG tablet  PLAN:  Hypertension We discussed sodium restriction, working on healthy weight loss, and a regular exercise program as the means to achieve improved blood pressure control. Chanda agreed with this plan and agreed to follow up as directed. We will  continue to monitor her blood pressure as well as her progress with the above lifestyle modifications. Shontina agrees to continue taking hydrochlorothiazide 12.5 mg q daily #30 and we will refill for 1 month. She will watch for signs of hypotension as she continues her lifestyle modifications. Elisah agrees to follow up with our clinic in 3 weeks.  Obesity Laporsha is currently in the action stage of change. As such, her goal is to continue with weight loss efforts She has agreed to follow a lower carbohydrate, vegetable and lean protein rich diet plan Bellamia has been instructed to work up to a goal of 150 minutes of combined cardio and strengthening exercise per week for weight loss and overall health benefits. We discussed the following Behavioral Modification Strategies today: increasing vegetables and increase H20 intake   Nolani has agreed to follow up with our clinic in 3 weeks. She was informed of the importance of frequent follow up visits to maximize her success with intensive lifestyle modifications for her multiple health conditions.  ALLERGIES: No Known Allergies  MEDICATIONS: Current Outpatient Medications on File Prior to Visit  Medication Sig Dispense Refill  . buPROPion (WELLBUTRIN SR) 150 MG 12 hr tablet Take 1 tablet (150 mg total) by mouth daily. 30 tablet 0  . ibuprofen (ADVIL,MOTRIN) 400 MG tablet Take 1 tablet (400 mg total) by mouth 3 (three) times daily. (Patient taking differently: Take 400 mg by mouth as needed. ) 30 tablet 0  . Multiple Vitamin (MULTIVITAMIN) tablet Take 1 tablet  by mouth daily.    . ondansetron (ZOFRAN ODT) 4 MG disintegrating tablet Take every 6-8 hours as needed. 20 tablet 1  . SUMAtriptan (IMITREX) 50 MG tablet Take 50 mg by mouth every 2 (two) hours as needed for migraine. May repeat in 2 hours if headache persists or recurs.    . Vitamin D, Ergocalciferol, (DRISDOL) 1.25 MG (50000 UT) CAPS capsule Take 1 capsule (50,000 Units total) by mouth every 7 (seven)  days. 4 capsule 0   No current facility-administered medications on file prior to visit.     PAST MEDICAL HISTORY: Past Medical History:  Diagnosis Date  . Anemia   . Asthma    as child  . Back pain   . GERD (gastroesophageal reflux disease)   . Headache   . Hypertension   . Joint pain   . Migraines   . Parathyroid abnormality ([place])   . Swelling    feet and legs  . Vitamin D deficiency     PAST SURGICAL HISTORY: Past Surgical History:  Procedure Laterality Date  . ESOPHAGOGASTRODUODENOSCOPY  09/13/2003   Normal EGD.   Marland Kitchen FOOT SURGERY Bilateral 2001  . PARATHYROIDECTOMY N/A 01/03/2016   Procedure: PARATHYROIDECTOMY;  Surgeon: Leta Baptist, MD;  Location: [facility];  Service: ENT;  Laterality: N/A;  . TUBAL LIGATION      SOCIAL HISTORY: Social History   Tobacco Use  . Smoking status: Never Smoker  . Smokeless tobacco: Never Used  Substance Use Topics  . Alcohol use: No  . Drug use: No    FAMILY HISTORY: Family History  Problem Relation Age of Onset  . Hypertension Father   . Cancer Maternal Grandmother   . Heart disease Paternal Grandmother   . Cancer Paternal Grandmother   . Diabetes Paternal Grandmother   . Melanoma Maternal Grandfather   . Colon cancer Neg Hx   . Colon polyps Neg Hx   . Esophageal cancer Neg Hx   . Rectal cancer Neg Hx   . Stomach cancer Neg Hx     ROS: Review of Systems  Constitutional: Positive for weight loss.  Cardiovascular: Negative for chest pain.  Neurological: Negative for headaches.       Negative lightheadedness    PHYSICAL EXAM: Pt in no acute distress  RECENT LABS AND TESTS: BMET    Component Value Date/Time   NA 139 02/06/2019 1010   K 4.2 02/06/2019 1010   CL 100 02/06/2019 1010   CO2 22 02/06/2019 1010   GLUCOSE 88 02/06/2019 1010   BUN 15 02/06/2019 1010   CREATININE 0.94 02/06/2019 1010   CALCIUM 9.0 02/06/2019 1010   CALCIUM 7.5 (L) 01/03/2016 1520   GFRNONAA 72 02/06/2019 1010   GFRAA  83 02/06/2019 1010   Lab Results  Component Value Date   HGBA1C 5.7 (H) 02/06/2019   HGBA1C 5.5 03/02/2018   HGBA1C 5.5 09/01/2017   HGBA1C 5.6 11/10/2016   HGBA1C 5.5 08/13/2016   Lab Results  Component Value Date   INSULIN 5.6 02/06/2019   INSULIN 5.8 03/02/2018   INSULIN 3.7 09/01/2017   INSULIN 5.6 11/10/2016   INSULIN 7.9 08/13/2016   CBC    Component Value Date/Time   WBC 4.3 03/02/2018 0746   RBC 4.41 03/02/2018 0746   HGB 12.7 03/02/2018 0746   HCT 38.4 03/02/2018 0746   MCV 87 03/02/2018 0746   MCH 28.8 03/02/2018 0746   MCHC 33.1 03/02/2018 0746   RDW 12.5 03/02/2018 0746   LYMPHSABS  1.8 03/02/2018 0746   EOSABS 0.1 03/02/2018 0746   BASOSABS 0.0 03/02/2018 0746   Iron/TIBC/Ferritin/ %Sat No results found for: IRON, TIBC, FERRITIN, IRONPCTSAT Lipid Panel     Component Value Date/Time   CHOL 205 (H) 02/06/2019 1010   TRIG 87 02/06/2019 1010   HDL 56 02/06/2019 1010   LDLCALC 132 (H) 02/06/2019 1010   Hepatic Function Panel     Component Value Date/Time   PROT 6.8 02/06/2019 1010   ALBUMIN 4.3 02/06/2019 1010   AST 16 02/06/2019 1010   ALT 12 02/06/2019 1010   ALKPHOS 61 02/06/2019 1010   BILITOT 0.3 02/06/2019 1010      Component Value Date/Time   TSH 1.870 03/02/2018 0746   TSH 2.080 08/13/2016 0928      I, Trixie Dredge, am acting as transcriptionist for Dennard Nip, MD I have reviewed the above documentation for accuracy and completeness, and I agree with the above. -Dennard Nip, MD

## 2019-03-08 DIAGNOSIS — Z09 Encounter for follow-up examination after completed treatment for conditions other than malignant neoplasm: Secondary | ICD-10-CM | POA: Diagnosis not present

## 2019-03-09 ENCOUNTER — Ambulatory Visit (INDEPENDENT_AMBULATORY_CARE_PROVIDER_SITE_OTHER): Payer: 59 | Admitting: Family Medicine

## 2019-03-09 ENCOUNTER — Other Ambulatory Visit: Payer: Self-pay

## 2019-03-09 ENCOUNTER — Encounter (INDEPENDENT_AMBULATORY_CARE_PROVIDER_SITE_OTHER): Payer: Self-pay | Admitting: Family Medicine

## 2019-03-09 VITALS — BP 129/81 | HR 77 | Temp 98.1°F | Ht 66.0 in | Wt 191.0 lb

## 2019-03-09 DIAGNOSIS — E559 Vitamin D deficiency, unspecified: Secondary | ICD-10-CM

## 2019-03-09 DIAGNOSIS — E669 Obesity, unspecified: Secondary | ICD-10-CM

## 2019-03-09 DIAGNOSIS — Z9189 Other specified personal risk factors, not elsewhere classified: Secondary | ICD-10-CM

## 2019-03-09 DIAGNOSIS — R7303 Prediabetes: Secondary | ICD-10-CM | POA: Diagnosis not present

## 2019-03-09 DIAGNOSIS — Z6830 Body mass index (BMI) 30.0-30.9, adult: Secondary | ICD-10-CM

## 2019-03-09 DIAGNOSIS — E66811 Obesity, class 1: Secondary | ICD-10-CM

## 2019-03-09 MED ORDER — VITAMIN D (ERGOCALCIFEROL) 1.25 MG (50000 UNIT) PO CAPS: 50000.0000 [IU] | ORAL_CAPSULE | ORAL | 0 refills | Status: DC

## 2019-03-09 NOTE — Progress Notes (Signed)
Office: [phone]  /  Fax: [phone]   HPI:   Chief Complaint: OBESITY [name] is here to discuss her progress with her obesity treatment plan. She is on the lower carbohydrate, vegetable and lean protein rich diet plan and is following her eating plan approximately 75 % of the time. She states she is exercising 0 minutes 0 times per week. [name]  Her weight is 191 lb (86.6 kg) today and she has maintained weight since her last visit in-office visit. She has gained 3 lbs since starting treatment with [name].  Vitamin D deficiency [name] has a diagnosis of vitamin D deficiency. [name] is stable on vit D and she is now at goal. [name] denies nausea, vomiting or muscle weakness.  At risk for osteopenia and osteoporosis [name] is at higher risk of osteopenia and osteoporosis due to vitamin D deficiency.   Pre-Diabetes [name] has a diagnosis of prediabetes based on her elevated Hgb A1c and was informed this puts her at greater risk of developing diabetes. Her A1c increased from 5.5 to 5.7 [name] had increased her simple carbohydrates, but she is ready to get back on track. She is not taking metformin currently and continues to work on diet and exercise to decrease risk of diabetes. She denies nausea or hypoglycemia.  ASSESSMENT AND PLAN:  Vitamin D deficiency - Plan: Vitamin D, Ergocalciferol, (DRISDOL) 1.25 MG (50000 UT) CAPS capsule  Prediabetes  At risk for osteoporosis  Class 1 obesity with serious comorbidity and body mass index (BMI) of 30.0 to 30.9 in adult, unspecified obesity type  PLAN:  Vitamin D Deficiency [name] was informed that low vitamin D levels contributes to fatigue and are associated with obesity, breast, and colon cancer. [name] agrees to continue to take prescription Vit D @50 ,000 IU every week #4 with no refills and she will follow up for routine testing of vitamin D, at least 2-3 times per year. She was informed of the risk of over-replacement of vitamin D and agrees to not  increase her dose unless she discusses this with [name] first. We will recheck labs in 3 months and [name] agrees to follow up as directed.  At risk for osteopenia and osteoporosis [name] was given extended  (15 minutes) osteoporosis prevention counseling today. [name] is at risk for osteopenia and osteoporosis due to her vitamin D deficiency. She was encouraged to take her vitamin D and follow her higher calcium diet and increase strengthening exercise to help strengthen her bones and decrease her risk of osteopenia and osteoporosis.  Pre-Diabetes [name] will continue to work on weight loss, exercise, and decreasing simple carbohydrates in her diet to help decrease the risk of diabetes. She was informed that eating too many simple carbohydrates or too many calories at one sitting increases the likelihood of GI side effects. We will recheck labs in 3 months and [name] agreed to follow up with [name] as directed to monitor her progress.  Obesity [name] is currently in the action stage of change. As such, her goal is to continue with weight loss efforts She has agreed to follow a lower carbohydrate, vegetable and lean protein rich diet plan [name] has been instructed to work up to a goal of 150 minutes of combined cardio and strengthening exercise per week for weight loss and overall health benefits. We discussed the following Behavioral Modification Strategies today: increase H2O intake and increasing fiber rich foods  [name] has agreed to follow up with our clinic in 2 to 3 weeks. She was informed  of the importance of frequent follow up visits to maximize her success with intensive lifestyle modifications for her multiple health conditions.  ALLERGIES: No Known Allergies  MEDICATIONS: Current Outpatient Medications on File Prior to Visit  Medication Sig Dispense Refill  . buPROPion (WELLBUTRIN SR) 150 MG 12 hr tablet Take 1 tablet (150 mg total) by mouth daily. 30 tablet 0  . hydrochlorothiazide (HYDRODIURIL) 12.5  MG tablet Take 1 tablet (12.5 mg total) by mouth daily. 30 tablet 0  . ibuprofen (ADVIL,MOTRIN) 400 MG tablet Take 1 tablet (400 mg total) by mouth 3 (three) times daily. (Patient taking differently: Take 400 mg by mouth as needed. ) 30 tablet 0  . Multiple Vitamin (MULTIVITAMIN) tablet Take 1 tablet by mouth daily.    . ondansetron (ZOFRAN ODT) 4 MG disintegrating tablet Take every 6-8 hours as needed. 20 tablet 1  . SUMAtriptan (IMITREX) 50 MG tablet Take 50 mg by mouth every 2 (two) hours as needed for migraine. May repeat in 2 hours if headache persists or recurs.     No current facility-administered medications on file prior to visit.     PAST MEDICAL HISTORY: Past Medical History:  Diagnosis Date  . Anemia   . Asthma    as child  . Back pain   . GERD (gastroesophageal reflux disease)   . Headache   . Hypertension   . Joint pain   . Migraines   . Parathyroid abnormality ([place])   . Swelling    feet and legs  . Vitamin D deficiency     PAST SURGICAL HISTORY: Past Surgical History:  Procedure Laterality Date  . ESOPHAGOGASTRODUODENOSCOPY  09/13/2003   Normal EGD.   Marland Kitchen FOOT SURGERY Bilateral 2001  . PARATHYROIDECTOMY N/A 01/03/2016   Procedure: PARATHYROIDECTOMY;  Surgeon: Leta Baptist, MD;  Location: [facility];  Service: ENT;  Laterality: N/A;  . TUBAL LIGATION      SOCIAL HISTORY: Social History   Tobacco Use  . Smoking status: Never Smoker  . Smokeless tobacco: Never Used  Substance Use Topics  . Alcohol use: No  . Drug use: No    FAMILY HISTORY: Family History  Problem Relation Age of Onset  . Hypertension Father   . Cancer Maternal Grandmother   . Heart disease Paternal Grandmother   . Cancer Paternal Grandmother   . Diabetes Paternal Grandmother   . Melanoma Maternal Grandfather   . Colon cancer Neg Hx   . Colon polyps Neg Hx   . Esophageal cancer Neg Hx   . Rectal cancer Neg Hx   . Stomach cancer Neg Hx     ROS: Review of Systems   Constitutional: Negative for weight loss.  Gastrointestinal: Negative for nausea and vomiting.  Musculoskeletal:       Negative for muscle weakness  Endo/Heme/Allergies:       Negative for hypoglycemia    PHYSICAL EXAM: Blood pressure 129/81, pulse 77, temperature 98.1 F (36.7 C), temperature source Oral, height 5\' 6"  (1.676 m), weight 191 lb (86.6 kg), last menstrual period 05/11/2017, SpO2 95 %. Body mass index is 30.83 kg/m. Physical Exam Vitals signs reviewed.  Constitutional:      Appearance: Normal appearance. She is well-developed. She is obese.  Cardiovascular:     Rate and Rhythm: Normal rate.  Pulmonary:     Effort: Pulmonary effort is normal.  Musculoskeletal: Normal range of motion.  Skin:    General: Skin is warm and dry.  Neurological:     Mental  Status: She is alert and oriented to person, place, and time.  Psychiatric:        Mood and Affect: Mood normal.        Behavior: Behavior normal.     RECENT LABS AND TESTS: BMET    Component Value Date/Time   NA 139 02/06/2019 1010   K 4.2 02/06/2019 1010   CL 100 02/06/2019 1010   CO2 22 02/06/2019 1010   GLUCOSE 88 02/06/2019 1010   BUN 15 02/06/2019 1010   CREATININE 0.94 02/06/2019 1010   CALCIUM 9.0 02/06/2019 1010   CALCIUM 7.5 (L) 01/03/2016 1520   GFRNONAA 72 02/06/2019 1010   GFRAA 83 02/06/2019 1010   Lab Results  Component Value Date   HGBA1C 5.7 (H) 02/06/2019   HGBA1C 5.5 03/02/2018   HGBA1C 5.5 09/01/2017   HGBA1C 5.6 11/10/2016   HGBA1C 5.5 08/13/2016   Lab Results  Component Value Date   INSULIN 5.6 02/06/2019   INSULIN 5.8 03/02/2018   INSULIN 3.7 09/01/2017   INSULIN 5.6 11/10/2016   INSULIN 7.9 08/13/2016   CBC    Component Value Date/Time   WBC 4.3 03/02/2018 0746   RBC 4.41 03/02/2018 0746   HGB 12.7 03/02/2018 0746   HCT 38.4 03/02/2018 0746   MCV 87 03/02/2018 0746   MCH 28.8 03/02/2018 0746   MCHC 33.1 03/02/2018 0746   RDW 12.5 03/02/2018 0746   LYMPHSABS 1.8  03/02/2018 0746   EOSABS 0.1 03/02/2018 0746   BASOSABS 0.0 03/02/2018 0746   Iron/TIBC/Ferritin/ %Sat No results found for: IRON, TIBC, FERRITIN, IRONPCTSAT Lipid Panel     Component Value Date/Time   CHOL 205 (H) 02/06/2019 1010   TRIG 87 02/06/2019 1010   HDL 56 02/06/2019 1010   LDLCALC 132 (H) 02/06/2019 1010   Hepatic Function Panel     Component Value Date/Time   PROT 6.8 02/06/2019 1010   ALBUMIN 4.3 02/06/2019 1010   AST 16 02/06/2019 1010   ALT 12 02/06/2019 1010   ALKPHOS 61 02/06/2019 1010   BILITOT 0.3 02/06/2019 1010      Component Value Date/Time   TSH 1.870 03/02/2018 0746   TSH 2.080 08/13/2016 0928    Results for Cameron, Ladonne R (MRN [id number]) as of 03/09/2019 15:53  Ref. Range 02/06/2019 10:10  Vitamin D, 25-Hydroxy Latest Ref Range: 30.0 - 100.0 ng/mL 53.8    OBESITY BEHAVIORAL INTERVENTION VISIT  Today's visit was # 53  Starting weight: 188 lbs Starting date: 08/13/2016 Today's weight : 191 lbs Today's date: 03/09/2019 Total lbs lost to date: 0    03/09/2019  Height 5\' 6"  (1.676 m)  Weight 191 lb (86.6 kg)  BMI (Calculated) 30.84  BLOOD PRESSURE - SYSTOLIC [id number]  BLOOD PRESSURE - DIASTOLIC 81   Body Fat % 99991111 %  Total Body Water (lbs) 74.8 lbs    ASK: We discussed the diagnosis of obesity with [name] today and Nariah agreed to give [name] permission to discuss obesity behavioral modification therapy today.  ASSESS: Jeliyah has the diagnosis of obesity and her BMI today is 30.84 Tya is in the action stage of change   ADVISE: Kennia was educated on the multiple health risks of obesity as well as the benefit of weight loss to improve her health. She was advised of the need for long term treatment and the importance of lifestyle modifications to improve her current health and to decrease her risk of future health problems.  AGREE: Multiple dietary modification options and  treatment options were discussed and  Aspen agreed to follow the recommendations  documented in the above note.  ARRANGE: Zelie was educated on the importance of frequent visits to treat obesity as outlined per CMS and USPSTF guidelines and agreed to schedule her next follow up appointment today.  I, Doreene Nest, am acting as transcriptionist for Dennard Nip, MD  I have reviewed the above documentation for accuracy and completeness, and I agree with the above. -Dennard Nip, MD

## 2019-03-29 ENCOUNTER — Telehealth (INDEPENDENT_AMBULATORY_CARE_PROVIDER_SITE_OTHER): Payer: 59 | Admitting: Family Medicine

## 2019-03-29 ENCOUNTER — Other Ambulatory Visit: Payer: Self-pay

## 2019-03-29 ENCOUNTER — Encounter (INDEPENDENT_AMBULATORY_CARE_PROVIDER_SITE_OTHER): Payer: Self-pay | Admitting: Family Medicine

## 2019-03-29 DIAGNOSIS — I10 Essential (primary) hypertension: Secondary | ICD-10-CM | POA: Diagnosis not present

## 2019-03-29 DIAGNOSIS — Z6831 Body mass index (BMI) 31.0-31.9, adult: Secondary | ICD-10-CM | POA: Diagnosis not present

## 2019-03-29 DIAGNOSIS — E559 Vitamin D deficiency, unspecified: Secondary | ICD-10-CM | POA: Diagnosis not present

## 2019-03-29 DIAGNOSIS — E669 Obesity, unspecified: Secondary | ICD-10-CM | POA: Diagnosis not present

## 2019-03-29 DIAGNOSIS — F3289 Other specified depressive episodes: Secondary | ICD-10-CM | POA: Diagnosis not present

## 2019-03-29 MED ORDER — VITAMIN D (ERGOCALCIFEROL) 1.25 MG (50000 UNIT) PO CAPS: 50000.0000 [IU] | ORAL_CAPSULE | ORAL | 0 refills | Status: DC

## 2019-03-29 MED ORDER — BUPROPION HCL ER (SR) 150 MG PO TB12: 150.0000 mg | ORAL_TABLET | Freq: Every day | ORAL | 0 refills | Status: DC

## 2019-03-29 MED ORDER — HYDROCHLOROTHIAZIDE 12.5 MG PO TABS: 12.5000 mg | ORAL_TABLET | Freq: Every day | ORAL | 0 refills | Status: DC

## 2019-03-29 MED FILL — BUPROPION HCL SR 150 MG TAB: 150 | 30 days supply | Qty: 30 | Fill #0

## 2019-03-29 MED FILL — HYDROCHLOROTHIAZIDE 12.5 MG: 12.5 | 30 days supply | Qty: 30 | Fill #0

## 2019-03-29 MED FILL — VIT D2 1.25 MG (50,000 UNIT: 1.25 MG | 28 days supply | Qty: 4 | Fill #0

## 2019-03-30 NOTE — Progress Notes (Signed)
Office: [phone]  /  Fax: [phone] TeleHealth Visit:  [name] [name] has verbally consented to this TeleHealth visit today. The patient is located at work, the provider is located at the [employer] [facility] office. The participants in this visit include the listed provider and patient. The visit was conducted today via Doxy.  HPI:   Chief Complaint: OBESITY [name] is here to discuss her progress with her obesity treatment plan. She is following a lower carbohydrate, vegetable and lean protein rich diet plan and is following her eating plan approximately 80% of the time. She states she is not exercising as she is status post partial hysterectomy. [name] has done well maintaining her weight. She had a partial hysterectomy last month and is still recovering, but did well trying to avoid boredom eating. 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 We were unable to weigh the patient today for this TeleHealth visit. She feels as if she has maintained her weight since her last visit. She has lost 0 lbs since starting treatment with [name].  Hypertension [name] is a 48 y.o. female with hypertension. [name] Dann denies chest pain or shortness of breath on exertion. She does not check her blood pressure at home, but it has been controlled in the past. She has developed some tinnitus and wonders if it is related to the HCTZ.  Depression  [name] is struggling with emotional eating and using food for comfort to the extent that it is negatively impacting her health. She often snacks when she is not hungry. [name] sometimes feels she is out of control and then feels guilty that she made poor food choices. She has been working on behavior modification techniques to help reduce her emotional eating and has been somewhat successful. [name] states mood is stable on medications and she reports decreased emotional eating. No insomnia or tremors. She shows no sign of suicidal  or homicidal ideations.  Depression screen PHQ 2/9 08/13/2016  Decreased Interest 1  Down, Depressed, Hopeless 2  PHQ - 2 Score 3  Altered sleeping 1  Tired, decreased energy 3  Change in appetite 3  Feeling bad or failure about yourself  1  Trouble concentrating 1  Moving slowly or fidgety/restless 1  Suicidal thoughts 0  PHQ-9 Score 13   Vitamin D deficiency [name] has a diagnosis of Vitamin D deficiency, which is now at goal. She is currently taking prescription Vit D and denies nausea, vomiting or muscle weakness.  ASSESSMENT AND PLAN:  Essential hypertension - Plan: hydrochlorothiazide (HYDRODIURIL) 12.5 MG tablet  Other depression - with emotional eating  - Plan: buPROPion (WELLBUTRIN SR) 150 MG 12 hr tablet  Vitamin D deficiency - Plan: Vitamin D, Ergocalciferol, (DRISDOL) 1.25 MG (50000 UT) CAPS capsule  Class 1 obesity with serious comorbidity and body mass index (BMI) of 31.0 to 31.9 in adult, unspecified obesity type  PLAN:  Hypertension We discussed sodium restriction, working on healthy weight loss, and a regular exercise program as the means to achieve improved blood pressure control. [name] agreed with this plan and agreed to follow up as directed. We will continue to monitor her blood pressure as well as her progress with the above lifestyle modifications. [name] was given a refill on her HCTZ. She agreed to see her ENT for evaluation. [name] will watch for signs of hypotension as she continues her lifestyle modifications.  Depression  We discussed behavior modification techniques today to  help [name] deal with her emotional eating and depression. She was given a refill on her Wellbutrin and agrees to follow-up with our clinic in 3 weeks.  Vitamin D Deficiency [name] was informed that low Vitamin D levels contributes to fatigue and are associated with obesity, breast, and colon cancer. She agrees to continue to take prescription Vit D @ 50,000 IU every week #4 with 0 refills  and will follow-up for routine testing of Vitamin D, at least 2-3 times per year. She was informed of the risk of over-replacement of Vitamin D and agrees to not increase her dose unless she discusses this with [name] first. [name] agrees to follow-up with our clinic in 3 weeks.  Obesity [name] is currently in the action stage of change. As such, her goal is to continue with weight loss efforts. She has agreed to follow a lower carbohydrate, vegetable and lean protein rich diet plan. [name] has been instructed to work up to a goal of 150 minutes of combined cardio and strengthening exercise per week for weight loss and overall health benefits. We discussed the following Behavioral Modification Strategies today: work on meal planning and easy cooking plans, and dealing with family or coworker sabotage.  [name] has agreed to follow-up with our clinic in 3 weeks. She was informed of the importance of frequent follow-up visits to maximize her success with intensive lifestyle modifications for her multiple health conditions.  ALLERGIES: No Known Allergies  MEDICATIONS: Current Outpatient Medications on File Prior to Visit  Medication Sig Dispense Refill   ibuprofen (ADVIL,MOTRIN) 400 MG tablet Take 1 tablet (400 mg total) by mouth 3 (three) times daily. (Patient taking differently: Take 400 mg by mouth as needed. ) 30 tablet 0   Multiple Vitamin (MULTIVITAMIN) tablet Take 1 tablet by mouth daily.     ondansetron (ZOFRAN ODT) 4 MG disintegrating tablet Take every 6-8 hours as needed. 20 tablet 1   SUMAtriptan (IMITREX) 50 MG tablet Take 50 mg by mouth every 2 (two) hours as needed for migraine. May repeat in 2 hours if headache persists or recurs.     No current facility-administered medications on file prior to visit.     PAST MEDICAL HISTORY: Past Medical History:  Diagnosis Date   Anemia    Asthma    as child   Back pain    GERD (gastroesophageal reflux disease)    Headache     Hypertension    Joint pain    Migraines    Parathyroid abnormality (HCC)    Swelling    feet and legs   Vitamin D deficiency     PAST SURGICAL HISTORY: Past Surgical History:  Procedure Laterality Date   ESOPHAGOGASTRODUODENOSCOPY  09/13/2003   Normal EGD.    FOOT SURGERY Bilateral 2001   PARATHYROIDECTOMY N/A 01/03/2016   Procedure: PARATHYROIDECTOMY;  Surgeon: Leta Baptist, MD;  Location: [place];  Service: ENT;  Laterality: N/A;   TUBAL LIGATION      SOCIAL HISTORY: Social History   Tobacco Use   Smoking status: Never Smoker   Smokeless tobacco: Never Used  Substance Use Topics   Alcohol use: No   Drug use: No    FAMILY HISTORY: Family History  Problem Relation Age of Onset   Hypertension Father    Cancer Maternal Grandmother    Heart disease Paternal Grandmother    Cancer Paternal Grandmother    Diabetes Paternal Grandmother    Melanoma Maternal Grandfather    Colon cancer Neg Hx  Colon polyps Neg Hx    Esophageal cancer Neg Hx    Rectal cancer Neg Hx    Stomach cancer Neg Hx    ROS: Review of Systems  Respiratory: Negative for shortness of breath.   Cardiovascular: Negative for chest pain.  Gastrointestinal: Negative for nausea and vomiting.  Musculoskeletal:       Negative for muscle weakness.  Neurological: Negative for tremors.  Psychiatric/Behavioral: Positive for depression. Negative for suicidal ideas. The patient does not have insomnia.        Negative for homicidal ideas.   PHYSICAL EXAM: Pt in no acute distress  RECENT LABS AND TESTS: BMET    Component Value Date/Time   NA 139 02/06/2019 1010   K 4.2 02/06/2019 1010   CL 100 02/06/2019 1010   CO2 22 02/06/2019 1010   GLUCOSE 88 02/06/2019 1010   BUN 15 02/06/2019 1010   CREATININE 0.94 02/06/2019 1010   CALCIUM 9.0 02/06/2019 1010   CALCIUM 7.5 (L) 01/03/2016 1520   GFRNONAA 72 02/06/2019 1010   GFRAA 83 02/06/2019 1010   Lab Results    Component Value Date   HGBA1C 5.7 (H) 02/06/2019   HGBA1C 5.5 03/02/2018   HGBA1C 5.5 09/01/2017   HGBA1C 5.6 11/10/2016   HGBA1C 5.5 08/13/2016   Lab Results  Component Value Date   INSULIN 5.6 02/06/2019   INSULIN 5.8 03/02/2018   INSULIN 3.7 09/01/2017   INSULIN 5.6 11/10/2016   INSULIN 7.9 08/13/2016   CBC    Component Value Date/Time   WBC 4.3 03/02/2018 0746   RBC 4.41 03/02/2018 0746   HGB 12.7 03/02/2018 0746   HCT 38.4 03/02/2018 0746   MCV 87 03/02/2018 0746   MCH 28.8 03/02/2018 0746   MCHC 33.1 03/02/2018 0746   RDW 12.5 03/02/2018 0746   LYMPHSABS 1.8 03/02/2018 0746   EOSABS 0.1 03/02/2018 0746   BASOSABS 0.0 03/02/2018 0746   Iron/TIBC/Ferritin/ %Sat No results found for: IRON, TIBC, FERRITIN, IRONPCTSAT Lipid Panel     Component Value Date/Time   CHOL 205 (H) 02/06/2019 1010   TRIG 87 02/06/2019 1010   HDL 56 02/06/2019 1010   LDLCALC 132 (H) 02/06/2019 1010   Hepatic Function Panel     Component Value Date/Time   PROT 6.8 02/06/2019 1010   ALBUMIN 4.3 02/06/2019 1010   AST 16 02/06/2019 1010   ALT 12 02/06/2019 1010   ALKPHOS 61 02/06/2019 1010   BILITOT 0.3 02/06/2019 1010      Component Value Date/Time   TSH 1.870 03/02/2018 0746   TSH 2.080 08/13/2016 0928   Results for Debow, Shawntrice R (MRN [id number]) as of 03/30/2019 11:29  Ref. Range 02/06/2019 10:10  Vitamin D, 25-Hydroxy Latest Ref Range: 30.0 - 100.0 ng/mL 53.8   I, Michaelene Song, am acting as Location manager for Dennard Nip, MD  I have reviewed the above documentation for accuracy and completeness, and I agree with the above. -Dennard Nip, MD

## 2019-04-07 ENCOUNTER — Ambulatory Visit (INDEPENDENT_AMBULATORY_CARE_PROVIDER_SITE_OTHER): Payer: 59 | Admitting: Podiatry

## 2019-04-07 ENCOUNTER — Other Ambulatory Visit: Payer: Self-pay

## 2019-04-07 DIAGNOSIS — M722 Plantar fascial fibromatosis: Secondary | ICD-10-CM | POA: Diagnosis not present

## 2019-04-12 DIAGNOSIS — Z23 Encounter for immunization: Secondary | ICD-10-CM | POA: Diagnosis not present

## 2019-04-19 ENCOUNTER — Telehealth (INDEPENDENT_AMBULATORY_CARE_PROVIDER_SITE_OTHER): Payer: 59 | Admitting: Family Medicine

## 2019-04-19 ENCOUNTER — Encounter (INDEPENDENT_AMBULATORY_CARE_PROVIDER_SITE_OTHER): Payer: Self-pay | Admitting: Family Medicine

## 2019-04-19 ENCOUNTER — Other Ambulatory Visit: Payer: Self-pay

## 2019-04-19 DIAGNOSIS — Z6831 Body mass index (BMI) 31.0-31.9, adult: Secondary | ICD-10-CM | POA: Diagnosis not present

## 2019-04-19 DIAGNOSIS — E559 Vitamin D deficiency, unspecified: Secondary | ICD-10-CM

## 2019-04-19 DIAGNOSIS — I10 Essential (primary) hypertension: Secondary | ICD-10-CM | POA: Diagnosis not present

## 2019-04-19 DIAGNOSIS — F3289 Other specified depressive episodes: Secondary | ICD-10-CM | POA: Diagnosis not present

## 2019-04-19 DIAGNOSIS — E669 Obesity, unspecified: Secondary | ICD-10-CM | POA: Diagnosis not present

## 2019-04-19 MED ORDER — VITAMIN D (ERGOCALCIFEROL) 1.25 MG (50000 UNIT) PO CAPS: 50000.0000 [IU] | ORAL_CAPSULE | ORAL | 0 refills | Status: DC

## 2019-04-19 MED ORDER — HYDROCHLOROTHIAZIDE 25 MG PO TABS: 25.0000 mg | ORAL_TABLET | Freq: Every day | ORAL | 0 refills | Status: DC

## 2019-04-19 MED ORDER — BUPROPION HCL ER (SR) 150 MG PO TB12: 150.0000 mg | ORAL_TABLET | Freq: Every day | ORAL | 0 refills | Status: DC

## 2019-04-19 MED FILL — HYDROCHLOROTHIAZIDE 25 MG T: 25 | 30 days supply | Qty: 30 | Fill #0

## 2019-04-20 MED FILL — VIT D2 1.25 MG (50,000 UNIT: 1.25 MG | 28 days supply | Qty: 4 | Fill #0

## 2019-04-25 NOTE — Progress Notes (Signed)
Office: [phone]  /  Fax: [phone] TeleHealth Visit:  [name] has verbally consented to this TeleHealth visit today. The patient is located at home, the provider is located at the [employer] [facility] office. The participants in this visit include the listed provider and patient. The visit was conducted today via [email or web address].  HPI:   Chief Complaint: OBESITY [name] is here to discuss her progress with her obesity treatment plan. She is on the lower carbohydrate, vegetable and lean protein rich diet plan and is following her eating plan approximately 85 % of the time. She states she is exercising 0 minutes 0 times per week. [name] continues to work on her diet and weight loss on the Low carbohydrate plan, and thinks she has lost another 1-2 lbs. She has been able to avoid temptations and not feel deprived. She is getting ready to start walking daily now.  We were unable to weigh the patient today for this TeleHealth visit. She feels as if she has lost 1-2 lbs since her last visit. She has lost 0 lbs since starting treatment with [name].  Hypertension [name] is a 48 y.o. female with hypertension. [name] states her blood pressure's have been a little elevated lately, averaging in 130's/90-100. She feels she may be retaining a bit of fluid. She has normally been well controlled with diet and medications. She denies chest pain, headaches, or dizziness. She is working on weight loss to help control her blood pressure with the goal of decreasing her risk of heart attack and stroke.  Vitamin D Deficiency [name] has a diagnosis of vitamin D deficiency. She is currently taking prescription Vit D. Last Vit D level was at goal. She denies nausea, vomiting or muscle weakness. She requests a refill.  Depression with emotional eating behaviors [name] is stable on Wellbutrin, her fatigue has improved, and her mood is stable. She is doing well minimizing emotional eating. [name] struggles with emotional eating  and using food for comfort to the extent that it is negatively impacting her health. She often snacks when she is not hungry. [name] sometimes feels she is out of control and then feels guilty that she made poor food choices. She has been working on behavior modification techniques to help reduce her emotional eating and has been somewhat successful. She shows no sign of suicidal or homicidal ideations.  Depression screen PHQ 2/9 08/13/2016  Decreased Interest 1  Down, Depressed, Hopeless 2  PHQ - 2 Score 3  Altered sleeping 1  Tired, decreased energy 3  Change in appetite 3  Feeling bad or failure about yourself  1  Trouble concentrating 1  Moving slowly or fidgety/restless 1  Suicidal thoughts 0  PHQ-9 Score 13    ASSESSMENT AND PLAN:  Class 1 obesity with serious comorbidity and body mass index (BMI) of 31.0 to 31.9 in adult, unspecified obesity type  Vitamin D deficiency - Plan: Vitamin D, Ergocalciferol, (DRISDOL) 1.25 MG (50000 UT) CAPS capsule  Other depression - with emotional eating  - Plan: buPROPion (WELLBUTRIN SR) 150 MG 12 hr tablet  Essential hypertension - Plan: hydrochlorothiazide (HYDRODIURIL) 25 MG tablet  PLAN:  Hypertension We discussed sodium restriction, working on healthy weight loss, and a regular exercise program as the means to achieve improved blood pressure control. [name] agreed with this plan and agreed to follow up as directed. We will continue to monitor her blood pressure as well as her progress with the above lifestyle modifications. [name] agrees to  increase hydrochlorothiazide to 25 mg q daily #30 with no refills. She will watch for signs of hypotension as she continues her lifestyle modifications. [name] agrees to follow up with our clinic in 3 weeks.  Vitamin D Deficiency [name] was informed that low vitamin D levels contributes to fatigue and are associated with obesity, breast, and colon cancer. [name] agrees to continue taking prescription Vit D 50,000 IU  every week #4 and we will refill for 1 month. She will follow up for routine testing of vitamin D, at least 2-3 times per year. She was informed of the risk of over-replacement of vitamin D and agrees to not increase her dose unless she discusses this with [name] first. [name] agrees to follow up with our clinic in 3 weeks.  Depression with Emotional Eating Behaviors We discussed behavior modification techniques today to help [name] deal with her emotional eating and depression. [name] agrees to continue taking Wellbutrin SR 150 mg q daily #30 and we will refill for 1 month. [name] agrees to follow up with our clinic in 3 weeks.  Obesity [name] is currently in the action stage of change. As such, her goal is to continue with weight loss efforts She has agreed to follow a lower carbohydrate, vegetable and lean protein rich diet plan [name] has been instructed to work up to a goal of 150 minutes of combined cardio and strengthening exercise per week for weight loss and overall health benefits. We discussed the following Behavioral Modification Strategies today: increasing lean protein intake, decreasing simple carbohydrates, increase H20 intake, and decreasing sodium intake   [name] has agreed to follow up with our clinic in 3 weeks. She was informed of the importance of frequent follow up visits to maximize her success with intensive lifestyle modifications for her multiple health conditions.  ALLERGIES: No Known Allergies  MEDICATIONS: Current Outpatient Medications on File Prior to Visit  Medication Sig Dispense Refill   ibuprofen (ADVIL,MOTRIN) 400 MG tablet Take 1 tablet (400 mg total) by mouth 3 (three) times daily. (Patient taking differently: Take 400 mg by mouth as needed. ) 30 tablet 0   Multiple Vitamin (MULTIVITAMIN) tablet Take 1 tablet by mouth daily.     ondansetron (ZOFRAN ODT) 4 MG disintegrating tablet Take every 6-8 hours as needed. 20 tablet 1   SUMAtriptan (IMITREX) 50 MG tablet Take 50  mg by mouth every 2 (two) hours as needed for migraine. May repeat in 2 hours if headache persists or recurs.     No current facility-administered medications on file prior to visit.     PAST MEDICAL HISTORY: Past Medical History:  Diagnosis Date   Anemia    Asthma    as child   Back pain    GERD (gastroesophageal reflux disease)    Headache    Hypertension    Joint pain    Migraines    Parathyroid abnormality (HCC)    Swelling    feet and legs   Vitamin D deficiency     PAST SURGICAL HISTORY: Past Surgical History:  Procedure Laterality Date   ESOPHAGOGASTRODUODENOSCOPY  09/13/2003   Normal EGD.    FOOT SURGERY Bilateral 2001   PARATHYROIDECTOMY N/A 01/03/2016   Procedure: PARATHYROIDECTOMY;  Surgeon: Leta Baptist, MD;  Location: [place];  Service: ENT;  Laterality: N/A;   TUBAL LIGATION      SOCIAL HISTORY: Social History   Tobacco Use   Smoking status: Never Smoker   Smokeless tobacco: Never Used  Substance Use  Topics   Alcohol use: No   Drug use: No    FAMILY HISTORY: Family History  Problem Relation Age of Onset   Hypertension Father    Cancer Maternal Grandmother    Heart disease Paternal Grandmother    Cancer Paternal Grandmother    Diabetes Paternal Grandmother    Melanoma Maternal Grandfather    Colon cancer Neg Hx    Colon polyps Neg Hx    Esophageal cancer Neg Hx    Rectal cancer Neg Hx    Stomach cancer Neg Hx     ROS: Review of Systems  Constitutional: Positive for weight loss.  Cardiovascular: Negative for chest pain.  Gastrointestinal: Negative for nausea and vomiting.  Musculoskeletal:       Negative muscle weakness  Neurological: Negative for dizziness and headaches.  Psychiatric/Behavioral: Positive for depression. Negative for suicidal ideas.    PHYSICAL EXAM: Pt in no acute distress  RECENT LABS AND TESTS: BMET    Component Value Date/Time   NA 139 02/06/2019 1010   K 4.2  02/06/2019 1010   CL 100 02/06/2019 1010   CO2 22 02/06/2019 1010   GLUCOSE 88 02/06/2019 1010   BUN 15 02/06/2019 1010   CREATININE 0.94 02/06/2019 1010   CALCIUM 9.0 02/06/2019 1010   CALCIUM 7.5 (L) 01/03/2016 1520   GFRNONAA 72 02/06/2019 1010   GFRAA 83 02/06/2019 1010   Lab Results  Component Value Date   HGBA1C 5.7 (H) 02/06/2019   HGBA1C 5.5 03/02/2018   HGBA1C 5.5 09/01/2017   HGBA1C 5.6 11/10/2016   HGBA1C 5.5 08/13/2016   Lab Results  Component Value Date   INSULIN 5.6 02/06/2019   INSULIN 5.8 03/02/2018   INSULIN 3.7 09/01/2017   INSULIN 5.6 11/10/2016   INSULIN 7.9 08/13/2016   CBC    Component Value Date/Time   WBC 4.3 03/02/2018 0746   RBC 4.41 03/02/2018 0746   HGB 12.7 03/02/2018 0746   HCT 38.4 03/02/2018 0746   MCV 87 03/02/2018 0746   MCH 28.8 03/02/2018 0746   MCHC 33.1 03/02/2018 0746   RDW 12.5 03/02/2018 0746   LYMPHSABS 1.8 03/02/2018 0746   EOSABS 0.1 03/02/2018 0746   BASOSABS 0.0 03/02/2018 0746   Iron/TIBC/Ferritin/ %Sat No results found for: IRON, TIBC, FERRITIN, IRONPCTSAT Lipid Panel     Component Value Date/Time   CHOL 205 (H) 02/06/2019 1010   TRIG 87 02/06/2019 1010   HDL 56 02/06/2019 1010   LDLCALC 132 (H) 02/06/2019 1010   Hepatic Function Panel     Component Value Date/Time   PROT 6.8 02/06/2019 1010   ALBUMIN 4.3 02/06/2019 1010   AST 16 02/06/2019 1010   ALT 12 02/06/2019 1010   ALKPHOS 61 02/06/2019 1010   BILITOT 0.3 02/06/2019 1010      Component Value Date/Time   TSH 1.870 03/02/2018 0746   TSH 2.080 08/13/2016 0928      I, Trixie Dredge, am acting as transcriptionist for Filomena Jungling, MD I have reviewed the above documentation for accuracy and completeness, and I agree with the above. -Dennard Nip, MD

## 2019-04-27 NOTE — Progress Notes (Signed)
Subjective: 48 year old female presents the office today for concerns of recurrent pain to part of her left heel she is requesting a steroid injection today.  No recent injury.  There is no longer the last couple of months however she recently had surgery and was not able to do steroid injection but she has been cleared to have a steroid injection now.Denies any systemic complaints such as fevers, chills, nausea, vomiting. No acute changes since last appointment, and no other complaints at this time.   Objective: AAO x3, NAD DP/PT pulses palpable bilaterally, CRT less than 3 seconds Tenderness to palpation along the plantar medial tubercle of the calcaneus at the insertion of plantar fascia on the left foot. There is no pain along the course of the plantar fascia within the arch of the foot. Plantar fascia appears to be intact. There is no pain with lateral compression of the calcaneus or pain with vibratory sensation. There is no pain along the course or insertion of the achilles tendon. No other areas of tenderness to bilateral lower extremities. No pain with calf compression, swelling, warmth, erythema  Assessment: Left heel pain, plantar fasciitis  Plan: -All treatment options discussed with the patient including all alternatives, risks, complications.  -Steroid injection performed.  See procedure note below.  Continue stretching, icing continue orthotics supportive shoes. -Patient encouraged to call the office with any questions, concerns, change in symptoms.   Procedure: Injection Tendon/Ligament Discussed alternatives, risks, complications and verbal consent was obtained.  Location: LEFT plantar fascia at the glabrous junction; medial approach. Skin Prep: Alcohol  Injectate: 0.5cc 0.5% marcaine plain, 0.5 cc 2% lidocaine plain and, 1 cc kenalog 10. Disposition: Patient tolerated procedure well. Injection site dressed with a band-aid.  Post-injection care was discussed and return  precautions discussed.   Trula Slade DPM

## 2019-05-02 DIAGNOSIS — Z760 Encounter for issue of repeat prescription: Secondary | ICD-10-CM | POA: Diagnosis not present

## 2019-05-03 ENCOUNTER — Encounter (INDEPENDENT_AMBULATORY_CARE_PROVIDER_SITE_OTHER): Payer: Self-pay

## 2019-05-08 ENCOUNTER — Encounter (INDEPENDENT_AMBULATORY_CARE_PROVIDER_SITE_OTHER): Payer: Self-pay | Admitting: Family Medicine

## 2019-05-08 ENCOUNTER — Ambulatory Visit (INDEPENDENT_AMBULATORY_CARE_PROVIDER_SITE_OTHER): Payer: 59 | Admitting: Family Medicine

## 2019-05-08 ENCOUNTER — Other Ambulatory Visit: Payer: Self-pay

## 2019-05-08 VITALS — BP 123/80 | HR 68 | Temp 98.4°F | Ht 66.0 in | Wt 188.0 lb

## 2019-05-08 DIAGNOSIS — K5909 Other constipation: Secondary | ICD-10-CM

## 2019-05-08 DIAGNOSIS — Z6830 Body mass index (BMI) 30.0-30.9, adult: Secondary | ICD-10-CM | POA: Diagnosis not present

## 2019-05-08 DIAGNOSIS — E669 Obesity, unspecified: Secondary | ICD-10-CM | POA: Diagnosis not present

## 2019-05-08 HISTORY — DX: Other constipation: K59.09

## 2019-05-08 NOTE — Progress Notes (Signed)
Office: [phone]  /  Fax: [phone]   HPI:   Chief Complaint: OBESITY [name] is here to discuss her progress with her obesity treatment plan. She is on the lower carbohydrate, vegetable and lean protein rich diet plan and is following her eating plan approximately 85 % of the time. She states she is exercising 0 minutes 0 times per week. [name] has been on the low carb plan for about three months. She reports she no longer craves sweets. [name] is eating plenty of vegetables. She does like the low carb plan and wants to stick with it.  Her weight is 188 lb (85.3 kg) today and has had a weight loss of 3 pounds since her last in-office visit. She has lost 0 lbs since starting treatment with [name].  Constipation [name] notes occasional constipation. She denies blood in her stool. [name] uses Miralax about once weekly which relieves the constipation. .   ASSESSMENT AND PLAN:  Other constipation  Class 1 obesity with serious comorbidity and body mass index (BMI) of 30.0 to 30.9 in adult, unspecified obesity type  PLAN:  Constipation [name] was informed decrease bowel movement frequency is normal while losing weight, but stools should not be hard or painful. She was advised to increase her H20 intake, continue to eat plenty of vegetables.  and continue Miralax as needed.  Obesity [name] is currently in the action stage of change. As such, her goal is to continue with weight loss efforts She has agreed to follow a lower carbohydrate, vegetable and lean protein rich diet plan [name] will walk for 30 minutes, 2 times per week for weight loss and overall health benefits. We discussed the following Behavioral Modification Strategies today: planning for success, increase H2O intake and work on meal planning and easy cooking plans  Recipe for egg muffins was given to patient today.  [name] has agreed to follow up with our clinic in 4 weeks. She was informed of the importance of frequent follow up visits to  maximize her success with intensive lifestyle modifications for her multiple health conditions.  ALLERGIES: No Known Allergies  MEDICATIONS: Current Outpatient Medications on File Prior to Visit  Medication Sig Dispense Refill  . buPROPion (WELLBUTRIN SR) 150 MG 12 hr tablet Take 1 tablet (150 mg total) by mouth daily. 30 tablet 0  . hydrochlorothiazide (HYDRODIURIL) 25 MG tablet Take 1 tablet (25 mg total) by mouth daily. 30 tablet 0  . ibuprofen (ADVIL,MOTRIN) 400 MG tablet Take 1 tablet (400 mg total) by mouth 3 (three) times daily. (Patient taking differently: Take 400 mg by mouth as needed. ) 30 tablet 0  . Multiple Vitamin (MULTIVITAMIN) tablet Take 1 tablet by mouth daily.    . ondansetron (ZOFRAN ODT) 4 MG disintegrating tablet Take every 6-8 hours as needed. 20 tablet 1  . SUMAtriptan (IMITREX) 50 MG tablet Take 50 mg by mouth every 2 (two) hours as needed for migraine. May repeat in 2 hours if headache persists or recurs.    . Vitamin D, Ergocalciferol, (DRISDOL) 1.25 MG (50000 UT) CAPS capsule Take 1 capsule (50,000 Units total) by mouth every 7 (seven) days. 4 capsule 0   No current facility-administered medications on file prior to visit.     PAST MEDICAL HISTORY: Past Medical History:  Diagnosis Date  . Anemia   . Asthma    as child  . Back pain   . GERD (gastroesophageal reflux disease)   . Headache   . Hypertension   . Joint  pain   . Migraines   . Parathyroid abnormality ([place])   . Swelling    feet and legs  . Vitamin D deficiency     PAST SURGICAL HISTORY: Past Surgical History:  Procedure Laterality Date  . ESOPHAGOGASTRODUODENOSCOPY  09/13/2003   Normal EGD.   Marland Kitchen FOOT SURGERY Bilateral 2001  . PARATHYROIDECTOMY N/A 01/03/2016   Procedure: PARATHYROIDECTOMY;  Surgeon: Leta Baptist, MD;  Location: [facility];  Service: ENT;  Laterality: N/A;  . TUBAL LIGATION      SOCIAL HISTORY: Social History   Tobacco Use  . Smoking status: Never Smoker   . Smokeless tobacco: Never Used  Substance Use Topics  . Alcohol use: No  . Drug use: No    FAMILY HISTORY: Family History  Problem Relation Age of Onset  . Hypertension Father   . Cancer Maternal Grandmother   . Heart disease Paternal Grandmother   . Cancer Paternal Grandmother   . Diabetes Paternal Grandmother   . Melanoma Maternal Grandfather   . Colon cancer Neg Hx   . Colon polyps Neg Hx   . Esophageal cancer Neg Hx   . Rectal cancer Neg Hx   . Stomach cancer Neg Hx     ROS: Review of Systems  Constitutional: Positive for weight loss.  Gastrointestinal: Positive for constipation.       Negative for hematochezia  Endo/Heme/Allergies:       Negative for sweet cravings    PHYSICAL EXAM: Blood pressure 123/80, pulse 68, temperature 98.4 F (36.9 C), temperature source Oral, height 5\' 6"  (1.676 m), weight 188 lb (85.3 kg), last menstrual period 05/11/2017, SpO2 99 %. Body mass index is 30.34 kg/m. Physical Exam Vitals signs reviewed.  Constitutional:      Appearance: Normal appearance. She is well-developed. She is obese.  Cardiovascular:     Rate and Rhythm: Normal rate.  Pulmonary:     Effort: Pulmonary effort is normal.  Musculoskeletal: Normal range of motion.  Skin:    General: Skin is warm and dry.  Neurological:     Mental Status: She is alert and oriented to person, place, and time.  Psychiatric:        Mood and Affect: Mood normal.        Behavior: Behavior normal.     RECENT LABS AND TESTS: BMET    Component Value Date/Time   NA 139 02/06/2019 1010   K 4.2 02/06/2019 1010   CL 100 02/06/2019 1010   CO2 22 02/06/2019 1010   GLUCOSE 88 02/06/2019 1010   BUN 15 02/06/2019 1010   CREATININE 0.94 02/06/2019 1010   CALCIUM 9.0 02/06/2019 1010   CALCIUM 7.5 (L) 01/03/2016 1520   GFRNONAA 72 02/06/2019 1010   GFRAA 83 02/06/2019 1010   Lab Results  Component Value Date   HGBA1C 5.7 (H) 02/06/2019   HGBA1C 5.5 03/02/2018   HGBA1C 5.5  09/01/2017   HGBA1C 5.6 11/10/2016   HGBA1C 5.5 08/13/2016   Lab Results  Component Value Date   INSULIN 5.6 02/06/2019   INSULIN 5.8 03/02/2018   INSULIN 3.7 09/01/2017   INSULIN 5.6 11/10/2016   INSULIN 7.9 08/13/2016   CBC    Component Value Date/Time   WBC 4.3 03/02/2018 0746   RBC 4.41 03/02/2018 0746   HGB 12.7 03/02/2018 0746   HCT 38.4 03/02/2018 0746   MCV 87 03/02/2018 0746   MCH 28.8 03/02/2018 0746   MCHC 33.1 03/02/2018 0746   RDW 12.5 03/02/2018 0746  LYMPHSABS 1.8 03/02/2018 0746   EOSABS 0.1 03/02/2018 0746   BASOSABS 0.0 03/02/2018 0746   Iron/TIBC/Ferritin/ %Sat No results found for: IRON, TIBC, FERRITIN, IRONPCTSAT Lipid Panel     Component Value Date/Time   CHOL 205 (H) 02/06/2019 1010   TRIG 87 02/06/2019 1010   HDL 56 02/06/2019 1010   LDLCALC 132 (H) 02/06/2019 1010   Hepatic Function Panel     Component Value Date/Time   PROT 6.8 02/06/2019 1010   ALBUMIN 4.3 02/06/2019 1010   AST 16 02/06/2019 1010   ALT 12 02/06/2019 1010   ALKPHOS 61 02/06/2019 1010   BILITOT 0.3 02/06/2019 1010      Component Value Date/Time   TSH 1.870 03/02/2018 0746   TSH 2.080 08/13/2016 0928    Results for Gillespie, Era R (MRN [id number]) as of 05/08/2019 09:03  Ref. Range 02/06/2019 10:10  Vitamin D, 25-Hydroxy Latest Ref Range: 30.0 - 100.0 ng/mL 53.8    OBESITY BEHAVIORAL INTERVENTION VISIT  Today's visit was # 44  Starting weight: 188 lbs Starting date: 08/13/2016 Today's weight : 188 lbs Today's date: 05/08/2019 Total lbs lost to date: 0    05/08/2019  Height 5\' 6"  (1.676 m)  Weight 188 lb (85.3 kg)  BMI (Calculated) 30.36  BLOOD PRESSURE - SYSTOLIC [id number]  BLOOD PRESSURE - DIASTOLIC 80   Body Fat % [id number] %  Total Body Water (lbs) 70.8 lbs    ASK: We discussed the diagnosis of obesity with [name] today and [name] agreed to give [name] permission to discuss obesity behavioral modification therapy today.  ASSESS: [name] has the diagnosis of obesity and  her BMI today is 30.36 [name] is in the action stage of change   ADVISE: [name] was educated on the multiple health risks of obesity as well as the benefit of weight loss to improve her health. She was advised of the need for long term treatment and the importance of lifestyle modifications to improve her current health and to decrease her risk of future health problems.  AGREE: Multiple dietary modification options and treatment options were discussed and  [name] agreed to follow the recommendations documented in the above note.  ARRANGE: [name] was educated on the importance of frequent visits to treat obesity as outlined per CMS and USPSTF guidelines and agreed to schedule her next follow up appointment today.  I, Doreene Nest, am acting as transcriptionist for [employer], FNP-C  I have reviewed the above documentation for accuracy and completeness, and I agree with the above.  - Dawn Whitmire, FNP-C.

## 2019-05-09 MED FILL — SUMAtriptan SUCCINATE 50 MG: 50 | 90 days supply | Qty: 27 | Fill #0

## 2019-05-17 DIAGNOSIS — H0230 Blepharochalasis unspecified eye, unspecified eyelid: Secondary | ICD-10-CM | POA: Diagnosis not present

## 2019-06-05 ENCOUNTER — Encounter (INDEPENDENT_AMBULATORY_CARE_PROVIDER_SITE_OTHER): Payer: Self-pay

## 2019-06-05 ENCOUNTER — Ambulatory Visit (INDEPENDENT_AMBULATORY_CARE_PROVIDER_SITE_OTHER): Payer: 59 | Admitting: Family Medicine

## 2019-06-08 ENCOUNTER — Other Ambulatory Visit (INDEPENDENT_AMBULATORY_CARE_PROVIDER_SITE_OTHER): Payer: Self-pay | Admitting: Family Medicine

## 2019-06-08 DIAGNOSIS — I10 Essential (primary) hypertension: Secondary | ICD-10-CM

## 2019-06-12 ENCOUNTER — Telehealth (INDEPENDENT_AMBULATORY_CARE_PROVIDER_SITE_OTHER): Payer: Self-pay | Admitting: Family Medicine

## 2019-06-12 ENCOUNTER — Encounter (INDEPENDENT_AMBULATORY_CARE_PROVIDER_SITE_OTHER): Payer: Self-pay

## 2019-06-12 MED ORDER — HYDROCHLOROTHIAZIDE 25 MG PO TABS: 25.0000 mg | ORAL_TABLET | Freq: Every day | ORAL | 0 refills | Status: DC

## 2019-06-12 MED FILL — HYDROCHLOROTHIAZIDE 25 MG T: 25 | 30 days supply | Qty: 30 | Fill #0

## 2019-06-12 NOTE — Telephone Encounter (Signed)
The prescription has been sent to patient pharmacy

## 2019-06-19 ENCOUNTER — Encounter (INDEPENDENT_AMBULATORY_CARE_PROVIDER_SITE_OTHER): Payer: Self-pay

## 2019-06-21 ENCOUNTER — Encounter (INDEPENDENT_AMBULATORY_CARE_PROVIDER_SITE_OTHER): Payer: Self-pay | Admitting: Family Medicine

## 2019-06-21 ENCOUNTER — Telehealth (INDEPENDENT_AMBULATORY_CARE_PROVIDER_SITE_OTHER): Payer: 59 | Admitting: Family Medicine

## 2019-06-21 ENCOUNTER — Other Ambulatory Visit: Payer: Self-pay

## 2019-06-21 DIAGNOSIS — E66811 Obesity, class 1: Secondary | ICD-10-CM

## 2019-06-21 DIAGNOSIS — E669 Obesity, unspecified: Secondary | ICD-10-CM | POA: Diagnosis not present

## 2019-06-21 DIAGNOSIS — K5909 Other constipation: Secondary | ICD-10-CM | POA: Diagnosis not present

## 2019-06-21 DIAGNOSIS — Z6830 Body mass index (BMI) 30.0-30.9, adult: Secondary | ICD-10-CM

## 2019-06-21 DIAGNOSIS — E559 Vitamin D deficiency, unspecified: Secondary | ICD-10-CM

## 2019-06-21 MED ORDER — VITAMIN D (ERGOCALCIFEROL) 1.25 MG (50000 UNIT) PO CAPS: 50000.0000 [IU] | ORAL_CAPSULE | ORAL | 0 refills | Status: DC

## 2019-06-21 MED FILL — VIT D2 1.25 MG (50,000 UNIT: 1.25 MG | 28 days supply | Qty: 4 | Fill #0

## 2019-06-25 ENCOUNTER — Emergency Department (HOSPITAL_BASED_OUTPATIENT_CLINIC_OR_DEPARTMENT_OTHER)
Admission: EM | Admit: 2019-06-25 | Discharge: 2019-06-25 | Disposition: A | Discharge status: Home / Self Care | Payer: 59 | Attending: Emergency Medicine | Admitting: Emergency Medicine

## 2019-06-25 ENCOUNTER — Other Ambulatory Visit: Payer: Self-pay

## 2019-06-25 ENCOUNTER — Emergency Department (HOSPITAL_BASED_OUTPATIENT_CLINIC_OR_DEPARTMENT_OTHER): Payer: 59

## 2019-06-25 ENCOUNTER — Encounter (HOSPITAL_BASED_OUTPATIENT_CLINIC_OR_DEPARTMENT_OTHER): Payer: Self-pay | Admitting: Emergency Medicine

## 2019-06-25 DIAGNOSIS — Z79899 Other long term (current) drug therapy: Secondary | ICD-10-CM | POA: Insufficient documentation

## 2019-06-25 DIAGNOSIS — K1121 Acute sialoadenitis: Secondary | ICD-10-CM | POA: Diagnosis not present

## 2019-06-25 DIAGNOSIS — J45909 Unspecified asthma, uncomplicated: Secondary | ICD-10-CM | POA: Diagnosis not present

## 2019-06-25 DIAGNOSIS — I10 Essential (primary) hypertension: Secondary | ICD-10-CM | POA: Insufficient documentation

## 2019-06-25 DIAGNOSIS — R221 Localized swelling, mass and lump, neck: Secondary | ICD-10-CM | POA: Diagnosis not present

## 2019-06-25 LAB — CBC WITH DIFFERENTIAL/PLATELET
Abs Immature Granulocytes: 0.02 10*3/uL (ref 0.00–0.07)
Basophils Absolute: 0 10*3/uL (ref 0.0–0.1)
Basophils Relative: 1 %
Eosinophils Absolute: 0.2 10*3/uL (ref 0.0–0.5)
Eosinophils Relative: 2 %
HCT: 38.6 % (ref 36.0–46.0)
Hemoglobin: 12.5 g/dL (ref 12.0–15.0)
Immature Granulocytes: 0 %
Lymphocytes Relative: 30 %
Lymphs Abs: 2.6 10*3/uL (ref 0.7–4.0)
MCH: 28.7 pg (ref 26.0–34.0)
MCHC: 32.4 g/dL (ref 30.0–36.0)
MCV: 88.7 fL (ref 80.0–100.0)
Monocytes Absolute: 0.7 10*3/uL (ref 0.1–1.0)
Monocytes Relative: 8 %
Neutro Abs: 5.2 10*3/uL (ref 1.7–7.7)
Neutrophils Relative %: 59 %
Platelets: 293 10*3/uL (ref 150–400)
RBC: 4.35 MIL/uL (ref 3.87–5.11)
RDW: 12.5 % (ref 11.5–15.5)
WBC: 8.7 10*3/uL (ref 4.0–10.5)
nRBC: 0 % (ref 0.0–0.2)

## 2019-06-25 LAB — BASIC METABOLIC PANEL
Anion gap: 9 (ref 5–15)
BUN: 21 mg/dL — ABNORMAL HIGH (ref 6–20)
CO2: 27 mmol/L (ref 22–32)
Calcium: 9.2 mg/dL (ref 8.9–10.3)
Chloride: 102 mmol/L (ref 98–111)
Creatinine, Ser: 0.92 mg/dL (ref 0.44–1.00)
GFR calc Af Amer: >60 mL/min (ref >60)
GFR calc non Af Amer: >60 mL/min (ref >60)
Glucose, Bld: 102 mg/dL — ABNORMAL HIGH (ref 70–99)
Potassium: 3.5 mmol/L (ref 3.5–5.1)
Sodium: 138 mmol/L (ref 135–145)

## 2019-06-25 MED ORDER — IOHEXOL 300 MG/ML  SOLN
100.0000 mL | Freq: Once | INTRAMUSCULAR | Status: AC | PRN
  Administered 2019-06-25: 75 mL via INTRAVENOUS

## 2019-06-25 MED ORDER — AMOXICILLIN-POT CLAVULANATE 875-125 MG PO TABS: 1.0000 | ORAL_TABLET | Freq: Two times a day (BID) | ORAL | 0 refills | Status: DC

## 2019-06-25 NOTE — ED Triage Notes (Addendum)
Pt reports swelling to R side of her throat after eating cereal last night. Also reports it is tender to touch.

## 2019-06-25 NOTE — Discharge Instructions (Addendum)
You have a salivary gland stone.  This should pass spontaneously, however I recommend using sour candy or lemon juice to help this process.  You can also attempt to massage the area from the back toward the front.  It does not appear infected at this time, however if you develop redness, fever, or significant worsening pain, I recommend beginning Augmentin.  Make sure to stay well-hydrated please return the emergency department if you develop any new or worsening symptoms.

## 2019-06-25 NOTE — ED Provider Notes (Signed)
[facility] EMERGENCY DEPARTMENT Provider Note   CSN: [id number] Arrival date & time: 06/25/19  1342     History Chief Complaint  Patient presents with  . throat problem    [name] is a 48 y.o. female with history of hypertension, anemia, GERD, previous parathyroidectomy who presents with swelling to the right side of her neck and throat. It is painful.  She noticed while eating cereal last night, but she does not believe the cereal caused it.  She noticed it got worse when she was eating something else today as well.  She denies any fevers or difficulty breathing or swallowing.  She denies any dental pain.  HPI     Past Medical History:  Diagnosis Date  . Anemia   . Asthma    as child  . Back pain   . GERD (gastroesophageal reflux disease)   . Headache   . Hypertension   . Joint pain   . Migraines   . Parathyroid abnormality ([place])   . Swelling    feet and legs  . Vitamin D deficiency     Patient Active Problem List   Diagnosis Date Noted  . Other constipation 05/08/2019  . Plantar fasciitis 11/16/2018  . Overweight with body mass index (BMI) 25.0-29.9 05/11/2018  . Other hyperlipidemia 09/01/2017  . 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 Atypical chest pain 04/13/2017  . Depression 02/24/2017  . Hyperlipidemia 02/24/2017  . Essential hypertension 02/24/2017  . Class 1 obesity with serious comorbidity and body mass index (BMI) of 30.0 to 30.9 in adult 08/31/2016  . Vitamin D deficiency 08/31/2016  . 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 ESOPHAGOGASTRODUODENOSCOPY  09/13/2003   Normal EGD.   Marland Kitchen FOOT SURGERY Bilateral 2001  . PARATHYROIDECTOMY N/A 01/03/2016   Procedure: PARATHYROIDECTOMY;  Surgeon: Leta Baptist, MD;  Location: [facility];  Service: ENT;  Laterality: N/A;  . TUBAL LIGATION       OB History    Gravida  2   Para  2   Term  2   Preterm      AB      Living  2     SAB      TAB      Ectopic      Multiple      Live Births              Family History  Problem Relation Age of Onset  . Hypertension Father   . Cancer Maternal Grandmother   . Heart disease Paternal Grandmother   . Cancer Paternal Grandmother   . Diabetes Paternal Grandmother   . Melanoma Maternal Grandfather   . Colon cancer Neg Hx   . Colon polyps Neg Hx   . Esophageal cancer Neg Hx   . Rectal cancer Neg Hx   . Stomach cancer Neg Hx     Social History   Tobacco Use  . Smoking status: Never Smoker  . Smokeless tobacco: Never Used  Substance Use Topics  . Alcohol use: No  . Drug use: No    Home Medications Prior to Admission medications   Medication Sig Start Date End Date Taking? Authorizing Provider  amoxicillin-clavulanate (AUGMENTIN) 875-125 MG tablet Take 1 tablet by mouth every 12 (twelve) hours. 06/25/19   Amiyah Shryock, Bea Graff, PA-C  buPROPion (WELLBUTRIN SR) 150 MG 12 hr tablet Take 1 tablet (150 mg total) by mouth daily. 04/19/19   Leafy Ro,  Caren D, MD  hydrochlorothiazide (HYDRODIURIL) 25 MG tablet Take 1 tablet (25 mg total) by mouth daily. 06/12/19   Whitmire, Joneen Boers, FNP  ibuprofen (ADVIL,MOTRIN) 400 MG tablet Take 1 tablet (400 mg total) by mouth 3 (three) times daily. Patient taking differently: Take 400 mg by mouth as needed.  09/15/16   Dennard Nip D, MD  Multiple Vitamin (MULTIVITAMIN) tablet Take 1 tablet by mouth daily.    [provider]  ondansetron (ZOFRAN ODT) 4 MG disintegrating tablet Take every 6-8 hours as needed. 11/15/18   Jackquline Denmark, MD  SUMAtriptan (IMITREX) 50 MG tablet Take 50 mg by mouth every 2 (two) hours as needed for migraine. May repeat in 2 hours if headache persists or recurs.    [provider]  Vitamin D, Ergocalciferol, (DRISDOL) 1.25 MG (50000 UT) CAPS capsule Take 1 capsule (50,000 Units total) by mouth every 7 (seven) days. 06/21/19   Whitmire, Joneen Boers, FNP    Allergies    Patient has no known  allergies.  Review of Systems   Review of Systems  Constitutional: Negative for chills and fever.  HENT: Positive for sore throat (R side neck). Negative for facial swelling.   Respiratory: Negative for shortness of breath.   Cardiovascular: Negative for chest pain.  Gastrointestinal: Negative for abdominal pain, nausea and vomiting.  Genitourinary: Negative for dysuria.  Musculoskeletal: Positive for neck pain. Negative for back pain.  Skin: Negative for rash and wound.  Neurological: Negative for headaches.  Psychiatric/Behavioral: The patient is not nervous/anxious.     Physical Exam Updated Vital Signs BP 124/77 (BP Location: Right Arm)   Pulse 72   Temp 98 F (36.7 C) (Oral)   Resp 16   Ht 5\' 6"  (1.676 m)   Wt 83.9 kg   LMP 05/11/2017   SpO2 99%   BMI 29.86 kg/m   Physical Exam Vitals and nursing note reviewed.  Constitutional:      General: She is not in acute distress.    Appearance: She is well-developed. She is not diaphoretic.  HENT:     Head: Normocephalic and atraumatic.     Mouth/Throat:     Pharynx: No oropharyngeal exudate.     Tonsils: 3+ on the right. 1+ on the left.     Comments: No dental tenderness, no masses felt in the buccal or sublingual space Patient reports right tonsil has always been much larger than the left, not erythematous or exudative Eyes:     General: No scleral icterus.       Right eye: No discharge.        Left eye: No discharge.     Conjunctiva/sclera: Conjunctivae normal.     Pupils: Pupils are equal, round, and reactive to light.  Neck:     Thyroid: No thyromegaly.   Cardiovascular:     Rate and Rhythm: Normal rate and regular rhythm.     Heart sounds: Normal heart sounds. No murmur. No friction rub. No gallop.   Pulmonary:     Effort: Pulmonary effort is normal. No respiratory distress.     Breath sounds: Normal breath sounds. No stridor. No wheezing or rales.  Abdominal:     General: Bowel sounds are normal. There is  no distension.     Palpations: Abdomen is soft.     Tenderness: There is no abdominal tenderness. There is no guarding or rebound.  Musculoskeletal:     Cervical back: Normal range of motion and neck supple.  Lymphadenopathy:  Cervical: No cervical adenopathy.  Skin:    General: Skin is warm and dry.     Coloration: Skin is not pale.     Findings: No rash.  Neurological:     Mental Status: She is alert.     Coordination: Coordination normal.     ED Results / Procedures / Treatments   Labs (all labs ordered are listed, but only abnormal results are displayed) Labs Reviewed  BASIC METABOLIC PANEL - Abnormal; Notable for the following components:      Result Value   Glucose, Bld 102 (*)    BUN 21 (*)    All other components within normal limits  CBC WITH DIFFERENTIAL/PLATELET    EKG None  Radiology CT Soft Tissue Neck W Contrast  Result Date: 06/25/2019 CLINICAL DATA:  Right-sided neck swelling and pain. History of parathyroidectomy in 2017. EXAM: CT NECK WITH CONTRAST TECHNIQUE: Multidetector CT imaging of the neck was performed using the standard protocol following the bolus administration of intravenous contrast. CONTRAST:  17mL OMNIPAQUE IOHEXOL 300 MG/ML  SOLN COMPARISON:  None. FINDINGS: Pharynx and larynx: Mild enlargement of the palatine tonsillar soft tissues, right slightly greater than left. No discrete mass. Patent airway. No retropharyngeal fluid. Salivary glands: The skin marker overlies the right submandibular gland which appears mildly enlarged, and there is moderate inflammatory stranding in the right submandibular space extending into the upper neck. No submandibular ductal dilatation or stone is identified. The left submandibular gland and both parotid glands are unremarkable. Thyroid: Unremarkable. Lymph nodes: Increased number of small lymph nodes in the neck bilaterally all measuring less than 1 cm in short axis with mild asymmetric enlargement of right-sided  lymph nodes in level IB, likely reactive. Vascular: Major vascular structures of the neck are patent. Limited intracranial: Unremarkable. Visualized orbits: Unremarkable. Mastoids and visualized paranasal sinuses: Clear. Skeleton: No acute osseous abnormality or suspicious osseous lesion. Upper chest: Clear lung apices. Other: None. IMPRESSION: Right submandibular and upper neck inflammation compatible with acute sialadenitis. No submandibular ductal dilatation or stone identified. Electronically Signed   By: Logan Bores M.D.   On: 06/25/2019 15:34    Procedures Procedures (including critical care time)  Medications Ordered in ED Medications  iohexol (OMNIPAQUE) 300 MG/ML solution 100 mL (75 mLs Intravenous Contrast Given 06/25/19 1511)    ED Course  I have reviewed the triage vital signs and the nursing notes.  Pertinent labs & imaging results that were available during my care of the patient were reviewed by me and considered in my medical decision making (see chart for details).    MDM Rules/Calculators/A&P                      Patient presenting with right-sided submandibular swelling.  Patient concerned there may be an abnormality with her parathyroid, she has had surgery on this in the past.  CT soft tissue neck performed for this reason and confirmed suspicion of acute sialadenitis.  Will treat with sour candy and lemon juice, massage.  Low suspicion of infection at this time, however will discharge home with Augmentin if patient develops fever, redness, significant worsening pain to the area.  Patient seems reasonable and plans to try the less invasive treatment first.  Return precautions discussed.  Patient understands and agrees with plan.  Patient vitals stable throughout ED course and discharged in satisfactory condition.  Final Clinical Impression(s) / ED Diagnoses Final diagnoses:  Acute sialoadenitis    Rx / DC Orders ED Discharge  Orders         Ordered     amoxicillin-clavulanate (AUGMENTIN) 875-125 MG tablet  Every 12 hours     06/25/19 [address] [place], [place] 06/25/19 1653    Veryl Speak, MD 06/28/19 1511

## 2019-06-26 NOTE — Progress Notes (Signed)
Office: [phone]  /  Fax: [phone] TeleHealth Visit:  [name] has verbally consented to this TeleHealth visit today. The patient is located at work, the provider is located at the [employer] [facility] office. The participants in this visit include the listed provider and patient and any and all parties involved. The visit was conducted today via WebEx.  HPI:  Chief Complaint: OBESITY [name] is here to discuss her progress with her obesity treatment plan. She is on the lower carbohydrate, vegetable and lean protein rich diet plan and states she is following her eating plan approximately 85 % of the time. She states she is walking 30 minutes 3 times per week.  [name] stayed on the low carb plan over Thanksgiving. She feels that she has lost weight (weight not reported). [name] has occasional chocolate, but she mostly sticks strictly to low carb. She has increased her water intake.  Vitamin D deficiency [name] has a diagnosis of vitamin D deficiency. Her vitamin D level is at goal. She is on prescription vitamin D weekly and she denies fatigue.  Constipation [name] has constipation and she is taking Miralax daily. She reports stools are hard and infrequent with Miralax weekly. She denies blood in her stools or dark stools.   ASSESSMENT AND PLAN:  Vitamin D deficiency - Plan: Vitamin D, Ergocalciferol, (DRISDOL) 1.25 MG (50000 UT) CAPS capsule  Other constipation  Class 1 obesity with serious comorbidity and body mass index (BMI) of 30.0 to 30.9 in adult, unspecified obesity type  PLAN:  Vitamin D Deficiency Low vitamin D level contributes to fatigue and are associated with obesity, breast, and colon cancer. Elleny agrees to continue to take prescription Vit D @50 ,000 IU every week #4 with no refills and we will check labs at the next in-office visit. She will follow up for routine testing of vitamin D, at least 2-3 times per year to avoid over-replacement. [name] agrees to follow up  with our clinic in 3 weeks.  Constipation [name] was informed decrease bowel movement frequency is normal while losing weight, but stools should not be hard or painful. She was advised to increase her H20 intake and work on optimizing her fiber intake. [name] agrees to increase Miralax to every other day and follow up as directed.  Obesity [name] is currently in the action stage of change. As such, her goal is to continue with weight loss efforts She has agreed to follow the lower carbohydrate, vegetable and lean protein rich diet plan [name] will continue walking for 30 minutes, 3 times per week for weight loss and overall health benefits. We discussed the following Behavioral Modification Strategies today: planning for success and decreasing simple carbohydrates   [name] has agreed to follow up with our clinic in 3 weeks. She was informed of the importance of frequent follow up visits to maximize her success with intensive lifestyle modifications for her multiple health conditions.  ALLERGIES: No Known Allergies  MEDICATIONS: Current Outpatient Medications on File Prior to Visit  Medication Sig Dispense Refill  . buPROPion (WELLBUTRIN SR) 150 MG 12 hr tablet Take 1 tablet (150 mg total) by mouth daily. 30 tablet 0  . hydrochlorothiazide (HYDRODIURIL) 25 MG tablet Take 1 tablet (25 mg total) by mouth daily. 30 tablet 0  . ibuprofen (ADVIL,MOTRIN) 400 MG tablet Take 1 tablet (400 mg total) by mouth 3 (three) times daily. (Patient taking differently: Take 400 mg by mouth as needed. ) 30 tablet 0  . Multiple Vitamin (MULTIVITAMIN)  tablet Take 1 tablet by mouth daily.    . ondansetron (ZOFRAN ODT) 4 MG disintegrating tablet Take every 6-8 hours as needed. 20 tablet 1  . SUMAtriptan (IMITREX) 50 MG tablet Take 50 mg by mouth every 2 (two) hours as needed for migraine. May repeat in 2 hours if headache persists or recurs.     No current facility-administered medications on file prior to visit.    PAST  MEDICAL HISTORY: Past Medical History:  Diagnosis Date  . Anemia   . Asthma    as child  . Back pain   . GERD (gastroesophageal reflux disease)   . Headache   . Hypertension   . Joint pain   . Migraines   . Parathyroid abnormality ([place])   . Swelling    feet and legs  . Vitamin D deficiency     PAST SURGICAL HISTORY: Past Surgical History:  Procedure Laterality Date  . ESOPHAGOGASTRODUODENOSCOPY  09/13/2003   Normal EGD.   Marland Kitchen FOOT SURGERY Bilateral 2001  . PARATHYROIDECTOMY N/A 01/03/2016   Procedure: PARATHYROIDECTOMY;  Surgeon: Leta Baptist, MD;  Location: [facility];  Service: ENT;  Laterality: N/A;  . TUBAL LIGATION      SOCIAL HISTORY: Social History   Tobacco Use  . Smoking status: Never Smoker  . Smokeless tobacco: Never Used  Substance Use Topics  . Alcohol use: No  . Drug use: No    FAMILY HISTORY: Family History  Problem Relation Age of Onset  . Hypertension Father   . Cancer Maternal Grandmother   . Heart disease Paternal Grandmother   . Cancer Paternal Grandmother   . Diabetes Paternal Grandmother   . Melanoma Maternal Grandfather   . Colon cancer Neg Hx   . Colon polyps Neg Hx   . Esophageal cancer Neg Hx   . Rectal cancer Neg Hx   . Stomach cancer Neg Hx     ROS: Review of Systems  Constitutional: Negative for malaise/fatigue and weight loss.  Gastrointestinal: Positive for constipation. Negative for blood in stool and melena.    PHYSICAL EXAM: Last menstrual period 05/11/2017. There is no height or weight on file to calculate BMI. Physical Exam Vitals reviewed.  Constitutional:      General: She is not in acute distress.    Appearance: Normal appearance. She is well-developed. She is obese.  Cardiovascular:     Rate and Rhythm: Normal rate.  Pulmonary:     Effort: Pulmonary effort is normal.  Musculoskeletal:        General: Normal range of motion.  Skin:    General: Skin is warm and dry.  Neurological:     Mental  Status: She is alert and oriented to person, place, and time.  Psychiatric:        Mood and Affect: Mood normal.        Behavior: Behavior normal.     RECENT LABS AND TESTS: BMET    Component Value Date/Time   NA 138 06/25/2019 1436   NA 139 02/06/2019 1010   K 3.5 06/25/2019 1436   CL 102 06/25/2019 1436   CO2 27 06/25/2019 1436   GLUCOSE 102 (H) 06/25/2019 1436   BUN 21 (H) 06/25/2019 1436   BUN 15 02/06/2019 1010   CREATININE 0.92 06/25/2019 1436   CALCIUM 9.2 06/25/2019 1436   CALCIUM 7.5 (L) 01/03/2016 1520   GFRNONAA >60 06/25/2019 1436   GFRAA >60 06/25/2019 1436   Lab Results  Component Value Date  HGBA1C 5.7 (H) 02/06/2019   HGBA1C 5.5 03/02/2018   HGBA1C 5.5 09/01/2017   HGBA1C 5.6 11/10/2016   HGBA1C 5.5 08/13/2016   Lab Results  Component Value Date   INSULIN 5.6 02/06/2019   INSULIN 5.8 03/02/2018   INSULIN 3.7 09/01/2017   INSULIN 5.6 11/10/2016   INSULIN 7.9 08/13/2016   CBC    Component Value Date/Time   WBC 8.7 06/25/2019 1436   RBC 4.35 06/25/2019 1436   HGB 12.5 06/25/2019 1436   HGB 12.7 03/02/2018 0746   HCT 38.6 06/25/2019 1436   HCT 38.4 03/02/2018 0746   PLT 293 06/25/2019 1436   MCV 88.7 06/25/2019 1436   MCV 87 03/02/2018 0746   MCH 28.7 06/25/2019 1436   MCHC 32.4 06/25/2019 1436   RDW 12.5 06/25/2019 1436   RDW 12.5 03/02/2018 0746   LYMPHSABS 2.6 06/25/2019 1436   LYMPHSABS 1.8 03/02/2018 0746   MONOABS 0.7 06/25/2019 1436   EOSABS 0.2 06/25/2019 1436   EOSABS 0.1 03/02/2018 0746   BASOSABS 0.0 06/25/2019 1436   BASOSABS 0.0 03/02/2018 0746   Iron/TIBC/Ferritin/ %Sat No results found for: IRON, TIBC, FERRITIN, IRONPCTSAT Lipid Panel     Component Value Date/Time   CHOL 205 (H) 02/06/2019 1010   TRIG 87 02/06/2019 1010   HDL 56 02/06/2019 1010   LDLCALC 132 (H) 02/06/2019 1010   Hepatic Function Panel     Component Value Date/Time   PROT 6.8 02/06/2019 1010   ALBUMIN 4.3 02/06/2019 1010   AST 16 02/06/2019  1010   ALT 12 02/06/2019 1010   ALKPHOS 61 02/06/2019 1010   BILITOT 0.3 02/06/2019 1010      Component Value Date/Time   TSH 1.870 03/02/2018 0746   TSH 2.080 08/13/2016 0928     Ref. Range 02/06/2019 10:10  Vitamin D, 25-Hydroxy Latest Ref Range: 30.0 - 100.0 ng/mL 53.8    I, Doreene Nest, am acting as Location manager for [employer], FNP-C  I have reviewed the above documentation for accuracy and completeness, and I agree with the above.  - Elsy Chiang, FNP-C.

## 2019-06-29 DIAGNOSIS — R609 Edema, unspecified: Secondary | ICD-10-CM | POA: Diagnosis not present

## 2019-08-31 ENCOUNTER — Other Ambulatory Visit: Payer: Self-pay | Admitting: Obstetrics and Gynecology

## 2019-08-31 DIAGNOSIS — Z1231 Encounter for screening mammogram for malignant neoplasm of breast: Secondary | ICD-10-CM

## 2019-09-14 MED FILL — SUMAtriptan SUCCINATE 50 MG: 50 | 90 days supply | Qty: 27 | Fill #1

## 2019-09-18 ENCOUNTER — Other Ambulatory Visit: Payer: Self-pay

## 2019-09-18 ENCOUNTER — Ambulatory Visit
Admission: RE | Admit: 2019-09-18 | Discharge: 2019-09-18 | Disposition: A | Discharge status: Home / Self Care | Payer: 59 | Source: Ambulatory Visit | Attending: Obstetrics and Gynecology | Admitting: Obstetrics and Gynecology

## 2019-09-18 DIAGNOSIS — Z1231 Encounter for screening mammogram for malignant neoplasm of breast: Secondary | ICD-10-CM

## 2019-09-18 MED FILL — FLUCONAZOLE 150 MG TABLET: 150 | 1 days supply | Qty: 1 | Fill #0

## 2019-10-28 ENCOUNTER — Ambulatory Visit: Payer: 59 | Attending: Internal Medicine

## 2019-10-28 DIAGNOSIS — Z23 Encounter for immunization: Secondary | ICD-10-CM

## 2019-10-28 NOTE — Progress Notes (Signed)
   Covid-19 Vaccination Clinic  Name:  [name]    MRN: [id number] DOB: December 27, 1970  10/28/2019  [name] was observed post Covid-19 immunization for 15 minutes without incident. She was provided with Vaccine Information Sheet and instruction to access the V-Safe system.   [name] was instructed to call 911 with any severe reactions post vaccine: Marland Kitchen Difficulty breathing  . Swelling of face and throat  . A fast heartbeat  . A bad rash all over body  . Dizziness and weakness   Immunizations Administered    Name Date Dose VIS Date Route   Pfizer COVID-19 Vaccine 10/28/2019  1:11 AM 0.3 mL 08/30/2018 Intramuscular   Manufacturer: [employer], [employer]   Lot: [id number]   [place]: [id number]

## 2019-11-18 ENCOUNTER — Other Ambulatory Visit: Payer: Self-pay

## 2019-11-18 ENCOUNTER — Ambulatory Visit: Payer: 59 | Attending: Internal Medicine

## 2019-11-18 DIAGNOSIS — Z23 Encounter for immunization: Secondary | ICD-10-CM

## 2019-11-18 NOTE — Progress Notes (Signed)
   Covid-19 Vaccination Clinic  Name:  [name]    MRN: [id number] DOB: [birth year]  11/18/2019  [name] was observed post Covid-19 immunization for 15 minutes without incident. She was provided with Vaccine Information Sheet and instruction to access the V-Safe system.   [name] was instructed to call 911 with any severe reactions post vaccine: Marland Kitchen Difficulty breathing  . Swelling of face and throat  . A fast heartbeat  . A bad rash all over body  . Dizziness and weakness   Immunizations Administered    Name Date Dose VIS Date Route   Pfizer COVID-19 Vaccine 11/18/2019 12:01 PM 0.3 mL 08/30/2018 Intramuscular   Manufacturer: [place]   Lot: [id number]   [place]: [id number]

## 2019-11-21 ENCOUNTER — Ambulatory Visit: Payer: 59

## 2019-11-29 ENCOUNTER — Other Ambulatory Visit: Payer: Self-pay

## 2019-11-30 DIAGNOSIS — Z1389 Encounter for screening for other disorder: Secondary | ICD-10-CM | POA: Diagnosis not present

## 2019-11-30 DIAGNOSIS — Z0000 Encounter for general adult medical examination without abnormal findings: Secondary | ICD-10-CM | POA: Diagnosis not present

## 2019-12-01 ENCOUNTER — Ambulatory Visit: Payer: 59 | Admitting: Plastic Surgery

## 2019-12-01 ENCOUNTER — Other Ambulatory Visit: Payer: Self-pay

## 2019-12-01 ENCOUNTER — Encounter: Payer: Self-pay | Admitting: Cardiology

## 2019-12-01 ENCOUNTER — Institutional Professional Consult (permissible substitution): Payer: 59 | Admitting: Plastic Surgery

## 2019-12-01 ENCOUNTER — Encounter: Payer: Self-pay | Admitting: Plastic Surgery

## 2019-12-01 ENCOUNTER — Ambulatory Visit: Payer: 59 | Admitting: Cardiology

## 2019-12-01 VITALS — BP 142/98 | HR 83 | Ht 66.0 in | Wt 192.2 lb

## 2019-12-01 DIAGNOSIS — Z719 Counseling, unspecified: Secondary | ICD-10-CM | POA: Insufficient documentation

## 2019-12-01 DIAGNOSIS — E782 Mixed hyperlipidemia: Secondary | ICD-10-CM | POA: Diagnosis not present

## 2019-12-01 DIAGNOSIS — M7989 Other specified soft tissue disorders: Secondary | ICD-10-CM

## 2019-12-01 DIAGNOSIS — I10 Essential (primary) hypertension: Secondary | ICD-10-CM

## 2019-12-01 DIAGNOSIS — R002 Palpitations: Secondary | ICD-10-CM

## 2019-12-01 HISTORY — DX: Other specified soft tissue disorders: M79.89

## 2019-12-01 HISTORY — DX: Counseling, unspecified: Z71.9

## 2019-12-01 HISTORY — DX: Palpitations: R00.2

## 2019-12-01 NOTE — Progress Notes (Signed)
Patient ID: [name], female    DOB: 1971/01/20, 49 y.o.   MRN: [id number]   Chief Complaint  Patient presents with  . Advice Only    for panniculectomy/tummy tuck    The patient is a 49 year old female here for consultation for an abdominoplasty.  She is 5 feet 6 inches tall and weighs 192 pounds.  She is very motivated to lose weight and get in better health.  She currently has a pannus and is interested in an abdominoplasty.  She does not have any signs of a hernia or severe diastases.  We talked about the difference between a panniculectomy and abdominoplasty.  She is primarily interested in an abdominoplasty.  Her past medical history and medications are listed below.  She is not a smoker.  She has not had any previous abdominal surgery.   Review of Systems  Constitutional: Negative.  Negative for activity change and appetite change.  HENT: Negative.   Eyes: Negative.   Respiratory: Negative.  Negative for chest tightness and shortness of breath.   Cardiovascular: Negative.  Negative for leg swelling.  Gastrointestinal: Negative for abdominal distention and abdominal pain.  Endocrine: Negative.   Genitourinary: Negative.   Musculoskeletal: Negative.   Neurological: Negative.   Hematological: Negative.   Psychiatric/Behavioral: Negative.     Past Medical History:  Diagnosis Date  . Anemia   . Asthma    as child  . Back pain   . GERD (gastroesophageal reflux disease)   . Headache   . Hypertension   . Joint pain   . Migraines   . Parathyroid abnormality ([place])   . Swelling    feet and legs  . Vitamin D deficiency     Past Surgical History:  Procedure Laterality Date  . ESOPHAGOGASTRODUODENOSCOPY  09/13/2003   Normal EGD.   Marland Kitchen FOOT SURGERY Bilateral 2001  . PARATHYROIDECTOMY N/A 01/03/2016   Procedure: PARATHYROIDECTOMY;  Surgeon: Leta Baptist, MD;  Location: [facility];  Service: ENT;  Laterality: N/A;  . TUBAL LIGATION        Current Outpatient  Medications:  .  albuterol (PROAIR HFA) 108 (90 Base) MCG/ACT inhaler, Inhale 1 puff into the lungs as needed., Disp: , Rfl:  .  ibuprofen (ADVIL,MOTRIN) 400 MG tablet, Take 1 tablet (400 mg total) by mouth 3 (three) times daily. (Patient taking differently: Take 400 mg by mouth as needed. ), Disp: 30 tablet, Rfl: 0 .  Multiple Vitamin (MULTIVITAMIN) tablet, Take 1 tablet by mouth daily., Disp: , Rfl:  .  SUMAtriptan (IMITREX) 50 MG tablet, Take 50 mg by mouth every 2 (two) hours as needed for migraine. May repeat in 2 hours if headache persists or recurs., Disp: , Rfl:  .  Vitamin D, Ergocalciferol, (DRISDOL) 1.25 MG (50000 UT) CAPS capsule, Take 1 capsule (50,000 Units total) by mouth every 7 (seven) days., Disp: 4 capsule, Rfl: 0   Objective:   Vitals:   12/01/19 1014  BP: (!) 145/93  Pulse: 64  Temp: 98 F (36.7 C)  SpO2: 98%    Physical Exam Vitals and nursing note reviewed.  Constitutional:      Appearance: Normal appearance.  HENT:     Head: Normocephalic and atraumatic.  Eyes:     Extraocular Movements: Extraocular movements intact.  Cardiovascular:     Rate and Rhythm: Normal rate.     Pulses: Normal pulses.  Pulmonary:     Effort: Pulmonary effort is normal. No respiratory distress.  Abdominal:     General: Abdomen is flat. There is no distension.     Palpations: There is no mass.     Tenderness: There is no abdominal tenderness.     Hernia: No hernia is present.  Musculoskeletal:     Cervical back: Normal range of motion.  Skin:    General: Skin is warm.     Capillary Refill: Capillary refill takes less than 2 seconds.  Neurological:     General: No focal deficit present.     Mental Status: She is alert and oriented to person, place, and time.  Psychiatric:        Mood and Affect: Mood normal.        Behavior: Behavior normal.        Thought Content: Thought content normal.     Assessment & Plan:  Encounter for counseling  The patient is a good  candidate for an abdominoplasty with lateral liposuction.  She was given a quote and is going to think things over. She is thinking about surgery in October. Pictures were obtained of the patient and placed in the chart with the patient's or guardian's permission.  [place], DO

## 2019-12-01 NOTE — Progress Notes (Signed)
Cardiology Office Note:    Date:  12/01/2019   ID:  [name], DOB January 21, 1971, MRN [id number]  PCP:  Anna Broker, MD  Cardiologist:  Jenne Campus, MD    Referring MD: Anna Broker, MD   Chief Complaint  Patient presents with  . Follow-up    1 YR FU   I have palpitations and swollen legs  History of Present Illness:    [name] is a 49 y.o. female who was referred to [name] in 2018 because of atypical symptoms.  She had some atypical chest pain but at the same time she was able to run on a regular basis with no difficulties.  She is coming back for follow-up.  Overall seems to be doing well.  Still trying to be active walks on the regular basis 4 times a week for this 3 miles.  She does get some shortness of breath but no chest pain tightness squeezing pressure burning chest.  Her concerns is swelling of lower extremities is happening at evening time, however, I did review proBNP that she had done in December for it which was perfectly normal.  She also described to have some swelling of her hands that happen when she walks as well as some tingling in the fingers that sometimes happen when she walks.  She also described a situation that she feels like her heart stopping that happens typically at night she said she can be awakened but this sensation.  She overall described the fact that she does not sleep well.  She wakes up in the middle of the night and then she has difficulty falling asleep.  She never been tested for sleep apnea.  Past Medical History:  Diagnosis Date  . Anemia   . Asthma    as child  . Back pain   . GERD (gastroesophageal reflux disease)   . Headache   . Hypertension   . Joint pain   . Migraines   . Parathyroid abnormality ([place])   . Swelling    feet and legs  . Vitamin D deficiency     Past Surgical History:  Procedure Laterality Date  . ESOPHAGOGASTRODUODENOSCOPY  09/13/2003   Normal EGD.   Marland Kitchen FOOT SURGERY Bilateral 2001  . PARATHYROIDECTOMY  N/A 01/03/2016   Procedure: PARATHYROIDECTOMY;  Surgeon: Anna Baptist, MD;  Location: [facility];  Service: ENT;  Laterality: N/A;  . TUBAL LIGATION      Current Medications: Current Meds  Medication Sig  . albuterol (PROAIR HFA) 108 (90 Base) MCG/ACT inhaler Inhale 1 puff into the lungs as needed.  Marland Kitchen ibuprofen (ADVIL,MOTRIN) 400 MG tablet Take 1 tablet (400 mg total) by mouth 3 (three) times daily. (Patient taking differently: Take 400 mg by mouth as needed. )  . Multiple Vitamin (MULTIVITAMIN) tablet Take 1 tablet by mouth daily.  . SUMAtriptan (IMITREX) 50 MG tablet Take 50 mg by mouth every 2 (two) hours as needed for migraine. May repeat in 2 hours if headache persists or recurs.  . Vitamin D, Ergocalciferol, (DRISDOL) 1.25 MG (50000 UT) CAPS capsule Take 1 capsule (50,000 Units total) by mouth every 7 (seven) days.     Allergies:   Patient has no known allergies.   Social History   Socioeconomic History  . Marital status: Married    Spouse name: [name]  . Number of children: Not on file  . Years of education: Not on file  . Highest education level: Not on file  Occupational History  . Occupation: CMA    Employer: [employer]  Tobacco Use  . Smoking status: Never Smoker  . Smokeless tobacco: Never Used  Substance and Sexual Activity  . Alcohol use: No  . Drug use: No  . Sexual activity: Yes    Partners: Male    Birth control/protection: Surgical    Comment: TL  Other Topics Concern  . Not on file  Social History Narrative  . Not on file   Social Determinants of Health   Financial Resource Strain:   . Difficulty of Paying Living Expenses:   Food Insecurity:   . Worried About Charity fundraiser in the Last Year:   . Arboriculturist in the Last Year:   Transportation Needs:   . Film/video editor (Medical):   Marland Kitchen Lack of Transportation (Non-Medical):   Physical Activity:   . Days of Exercise per Week:   . Minutes of Exercise per Session:     Stress:   . Feeling of Stress :   Social Connections:   . Frequency of Communication with Friends and Family:   . Frequency of Social Gatherings with Friends and Family:   . Attends Religious Services:   . Active Member of Clubs or Organizations:   . Attends Archivist Meetings:   Marland Kitchen Marital Status:      Family History: The patient's family history includes Cancer in her maternal grandmother and paternal grandmother; Diabetes in her paternal grandmother; Heart disease in her paternal grandmother; Hypertension in her father; Melanoma in her maternal grandfather. There is no history of Colon cancer, Colon polyps, Esophageal cancer, Rectal cancer, or Stomach cancer. ROS:   Please see the history of present illness.    All 14 point review of systems negative except as described per history of present illness  EKGs/Labs/Other Studies Reviewed:      Recent Labs: 02/06/2019: ALT 12 06/25/2019: BUN 21; Creatinine, Ser 0.92; Hemoglobin 12.5; Platelets 293; Potassium 3.5; Sodium 138  Recent Lipid Panel    Component Value Date/Time   CHOL 205 (H) 02/06/2019 1010   TRIG 87 02/06/2019 1010   HDL 56 02/06/2019 1010   LDLCALC 132 (H) 02/06/2019 1010    Physical Exam:    VS:  BP (!) 142/98   Pulse 83   Ht 5\' 6"  (1.676 m)   Wt 192 lb 3.2 oz (87.2 kg)   LMP 05/11/2017   SpO2 97%   BMI 31.02 kg/m     Wt Readings from Last 3 Encounters:  12/01/19 192 lb 3.2 oz (87.2 kg)  12/01/19 192 lb (87.1 kg)  06/25/19 185 lb (83.9 kg)     GEN:  Well nourished, well developed in no acute distress HEENT: Normal NECK: No JVD; No carotid bruits LYMPHATICS: No lymphadenopathy CARDIAC: RRR, no murmurs, no rubs, no gallops RESPIRATORY:  Clear to auscultation without rales, wheezing or rhonchi  ABDOMEN: Soft, non-tender, non-distended MUSCULOSKELETAL:  No edema; No deformity  SKIN: Warm and dry LOWER EXTREMITIES: no swelling NEUROLOGIC:  Alert and oriented x 3 PSYCHIATRIC:  Normal  affect   ASSESSMENT:    1. Essential hypertension   2. Palpitations   3. Mixed hyperlipidemia   4. Swelling of both lower extremities    PLAN:    In order of problems listed above:  1. Essential hypertension blood pressure elevated today however she check her blood pressure on the regular basis at home her blood pressure is within normal limits. 2. Palpitations: We will  put a Zio patch on her for 2 weeks and see exactly what kind of arrhythmia she is experiencing I suspect she does have some APCs/PVCs. 3. Dyslipidemia, we calculated her 10-year risk for coronary events which came very low only 1.3%.  Therefore, no need for statin however discussion about her risk factors modification has happened.  We talked about need to exercise on the regular basis I discussed basic of Mediterranean diet.  She will try to do that. 4. Swelling of lower extremities only mild with proBNP being normal in December.  She is scheduled to have echocardiogram and will wait for results of the test.  Overall I have a rather low level suspicion for some cardiac issue going on.   Medication Adjustments/Labs and Tests Ordered: Current medicines are reviewed at length with the patient today.  Concerns regarding medicines are outlined above.  No orders of the defined types were placed in this encounter.  Medication changes: No orders of the defined types were placed in this encounter.   Signed, Park Liter, MD, [facility] 12/01/2019 3:35 PM    [facility]

## 2019-12-01 NOTE — Patient Instructions (Signed)
Medication Instructions:  °Your physician recommends that you continue on your current medications as directed. Please refer to the Current Medication list given to you today. ° °*If you need a refill on your cardiac medications before your next appointment, please call your pharmacy* ° ° °Lab Work: °None. ° °If you have labs (blood work) drawn today and your tests are completely normal, you will receive your results only by: °• MyChart Message (if you have MyChart) OR °• A paper copy in the mail °If you have any lab test that is abnormal or we need to change your treatment, we will call you to review the results. ° ° °Testing/Procedures: °Your physician has requested that you have an echocardiogram. Echocardiography is a painless test that uses sound waves to create images of your heart. It provides your doctor with information about the size and shape of your heart and how well your heart’s chambers and valves are working. This procedure takes approximately one hour. There are no restrictions for this procedure. ° °A zio monitor was ordered today. It will remain on for 14 days. You will then return monitor and event diary in provided box. It takes 1-2 weeks for report to be downloaded and returned to us. We will call you with the results. If monitor falls off or has orange flashing light, please call Zio for further instructions.  ° ° ° ° °Follow-Up: °At [facility], you and your health needs are our priority.  As part of our continuing mission to provide you with exceptional heart care, we have created designated Provider Care Teams.  These Care Teams include your primary Cardiologist (physician) and Advanced Practice Providers (APPs -  Physician Assistants and Nurse Practitioners) who all work together to provide you with the care you need, when you need it. ° °We recommend signing up for the patient portal called "MyChart".  Sign up information is provided on this After Visit Summary.  MyChart is used to  connect with patients for Virtual Visits (Telemedicine).  Patients are able to view lab/test results, encounter notes, upcoming appointments, etc.  Non-urgent messages can be sent to your provider as well.   °To learn more about what you can do with MyChart, go to [email or web address].com.   ° °Your next appointment:   °6 week(s) ° °The format for your next appointment:   °In Person ° °Provider:   °Robert Krasowski, MD ° ° °Other Instructions ° ° °Echocardiogram °An echocardiogram is a procedure that uses painless sound waves (ultrasound) to produce an image of the heart. Images from an echocardiogram can provide important information about: °· Signs of coronary artery disease (CAD). °· Aneurysm detection. An aneurysm is a weak or damaged part of an artery wall that bulges out from the normal force of blood pumping through the body. °· Heart size and shape. Changes in the size or shape of the heart can be associated with certain conditions, including heart failure, aneurysm, and CAD. °· Heart muscle function. °· Heart valve function. °· Signs of a past heart attack. °· Fluid buildup around the heart. °· Thickening of the heart muscle. °· A tumor or infectious growth around the heart valves. °Tell a health care provider about: °· Any allergies you have. °· All medicines you are taking, including vitamins, herbs, eye drops, creams, and over-the-counter medicines. °· Any blood disorders you have. °· Any surgeries you have had. °· Any medical conditions you have. °· Whether you are pregnant or may be pregnant. °What are the risks? °  Generally, this is a safe procedure. However, problems may occur, including:  Allergic reaction to dye (contrast) that may be used during the procedure. What happens before the procedure? No specific preparation is needed. You may eat and drink normally. What happens during the procedure?   An IV tube may be inserted into one of your veins.  You may receive contrast through this  tube. A contrast is an injection that improves the quality of the pictures from your heart.  A gel will be applied to your chest.  A wand-like tool (transducer) will be moved over your chest. The gel will help to transmit the sound waves from the transducer.  The sound waves will harmlessly bounce off of your heart to allow the heart images to be captured in real-time motion. The images will be recorded on a computer. The procedure may vary among health care providers and hospitals. What happens after the procedure?  You may return to your normal, everyday life, including diet, activities, and medicines, unless your health care provider tells you not to do that. Summary  An echocardiogram is a procedure that uses painless sound waves (ultrasound) to produce an image of the heart.  Images from an echocardiogram can provide important information about the size and shape of your heart, heart muscle function, heart valve function, and fluid buildup around your heart.  You do not need to do anything to prepare before this procedure. You may eat and drink normally.  After the echocardiogram is completed, you may return to your normal, everyday life, unless your health care provider tells you not to do that. This information is not intended to replace advice given to you by your health care provider. Make sure you discuss any questions you have with your health care provider. Document Revised: 10/13/2018 Document Reviewed: 07/25/2016 Elsevier Patient Education  [place].

## 2019-12-06 ENCOUNTER — Ambulatory Visit (INDEPENDENT_AMBULATORY_CARE_PROVIDER_SITE_OTHER): Payer: 59

## 2019-12-06 DIAGNOSIS — R002 Palpitations: Secondary | ICD-10-CM

## 2019-12-18 MED FILL — SUMAtriptan SUCCINATE 50 MG: 50 | 90 days supply | Qty: 27 | Fill #2

## 2019-12-27 ENCOUNTER — Other Ambulatory Visit (HOSPITAL_COMMUNITY): Payer: 59

## 2020-01-01 DIAGNOSIS — R002 Palpitations: Secondary | ICD-10-CM | POA: Diagnosis not present

## 2020-01-03 ENCOUNTER — Ambulatory Visit: Payer: 59 | Admitting: Plastic Surgery

## 2020-01-09 ENCOUNTER — Other Ambulatory Visit: Payer: Self-pay

## 2020-01-09 ENCOUNTER — Ambulatory Visit (INDEPENDENT_AMBULATORY_CARE_PROVIDER_SITE_OTHER): Payer: 59

## 2020-01-09 DIAGNOSIS — R600 Localized edema: Secondary | ICD-10-CM

## 2020-01-09 DIAGNOSIS — R002 Palpitations: Secondary | ICD-10-CM | POA: Diagnosis not present

## 2020-01-09 DIAGNOSIS — M7989 Other specified soft tissue disorders: Secondary | ICD-10-CM | POA: Diagnosis not present

## 2020-01-09 NOTE — Progress Notes (Signed)
Complete echocardiogram performed.  Jimmy Mourad Cwikla RDCS, RVT  

## 2020-01-10 ENCOUNTER — Encounter: Payer: Self-pay | Admitting: Surgical

## 2020-01-10 ENCOUNTER — Ambulatory Visit: Payer: 59 | Admitting: Surgical

## 2020-01-10 VITALS — BP 145/93 | HR 77 | Temp 97.1°F

## 2020-01-10 DIAGNOSIS — Z719 Counseling, unspecified: Secondary | ICD-10-CM

## 2020-01-10 NOTE — Progress Notes (Cosign Needed)
   Subjective:     Patient ID: [name], female    DOB: Mar 08, 1971, 49 y.o.   MRN: [id number]  Chief Complaint  Patient presents with  . Follow-up    HPI: The patient is a 49 y.o. female here to discuss visual field exam and discuss upper lid blepharoplasty for decreased visual fields due to excess upper eyelid tissue.  Patient reports that she also notices worsening vision due to swelling of tissue medially when she has migraines. She notices decreased superior vision, especially worse at night.  She had a visual field exam by Dr. Gershon Lane which noted decreased superior visual fields.  She has the exam results with her on her phone today, but will fax a hard copy to [name] this afternoon.  Review of Systems  Constitutional: Negative.   Eyes: Positive for visual disturbance.  Skin: Negative.      Objective:   Vital Signs BP (!) 145/93 (BP Location: Left Arm, Patient Position: Sitting, Cuff Size: Large)   Pulse 77   Temp (!) 97.1 F (36.2 C) (Temporal)   LMP 05/11/2017   SpO2 96%  Vital Signs and Nursing Note Reviewed Physical Exam Constitutional:      General: She is not in acute distress.    Appearance: She is not ill-appearing.  HENT:     Head: Normocephalic and atraumatic.  Eyes:     General:        Right eye: No discharge.        Left eye: No discharge.     Extraocular Movements: Extraocular movements intact.     Conjunctiva/sclera:     Right eye: Right conjunctiva is not injected.     Left eye: Left conjunctiva is not injected.     Comments: Redundant tissue of bilateral upper eyelids noted. She also has a prominent medial fat pad bilaterally. No scleral irritation.   Pulmonary:     Effort: Pulmonary effort is normal.  Skin:    General: Skin is warm and dry.  Neurological:     General: No focal deficit present.     Mental Status: She is alert and oriented to person, place, and time.  Psychiatric:        Mood and Affect: Mood normal.        Behavior: Behavior  normal.     Assessment/Plan:     ICD-10-CM   1. Encounter for counseling  Z71.9     Patient is a good candidate for upper eyelid blepharoplasty due to excess tissue and decreased visual fields noted on recent visual field exam by Dr. Gershon Lane.  Dr. Marla Lane was present for a portion of today's evaluation and evaluated the patient with me.  Await faxed results from patient's visual field results.  Photos taken of patient's eyelids taped and untapped.  We will submit these along with the VFE results. Patient would like surgical intervention to correct upper eyelid excess skin and tissue.  Pictures were obtained of the patient and placed in the chart with the patient's or guardian's permission.   [name] Anna Ferg, PA-C 01/10/2020, 3:36 PM

## 2020-01-12 ENCOUNTER — Other Ambulatory Visit: Payer: Self-pay

## 2020-01-12 ENCOUNTER — Encounter (HOSPITAL_BASED_OUTPATIENT_CLINIC_OR_DEPARTMENT_OTHER): Payer: Self-pay | Admitting: Emergency Medicine

## 2020-01-12 ENCOUNTER — Emergency Department (HOSPITAL_BASED_OUTPATIENT_CLINIC_OR_DEPARTMENT_OTHER)
Admission: EM | Admit: 2020-01-12 | Discharge: 2020-01-12 | Disposition: A | Discharge status: Home / Self Care | Payer: 59 | Attending: Emergency Medicine | Admitting: Emergency Medicine

## 2020-01-12 ENCOUNTER — Emergency Department (HOSPITAL_BASED_OUTPATIENT_CLINIC_OR_DEPARTMENT_OTHER): Payer: 59

## 2020-01-12 ENCOUNTER — Telehealth: Payer: Self-pay | Admitting: Cardiology

## 2020-01-12 DIAGNOSIS — K219 Gastro-esophageal reflux disease without esophagitis: Secondary | ICD-10-CM | POA: Insufficient documentation

## 2020-01-12 DIAGNOSIS — N83202 Unspecified ovarian cyst, left side: Secondary | ICD-10-CM | POA: Insufficient documentation

## 2020-01-12 DIAGNOSIS — J45909 Unspecified asthma, uncomplicated: Secondary | ICD-10-CM | POA: Diagnosis not present

## 2020-01-12 DIAGNOSIS — N3001 Acute cystitis with hematuria: Secondary | ICD-10-CM | POA: Diagnosis not present

## 2020-01-12 DIAGNOSIS — Z7951 Long term (current) use of inhaled steroids: Secondary | ICD-10-CM | POA: Insufficient documentation

## 2020-01-12 DIAGNOSIS — H524 Presbyopia: Secondary | ICD-10-CM | POA: Diagnosis not present

## 2020-01-12 DIAGNOSIS — N281 Cyst of kidney, acquired: Secondary | ICD-10-CM | POA: Diagnosis not present

## 2020-01-12 DIAGNOSIS — I10 Essential (primary) hypertension: Secondary | ICD-10-CM | POA: Diagnosis not present

## 2020-01-12 DIAGNOSIS — N83292 Other ovarian cyst, left side: Secondary | ICD-10-CM | POA: Diagnosis not present

## 2020-01-12 DIAGNOSIS — R109 Unspecified abdominal pain: Secondary | ICD-10-CM | POA: Insufficient documentation

## 2020-01-12 DIAGNOSIS — N200 Calculus of kidney: Secondary | ICD-10-CM | POA: Insufficient documentation

## 2020-01-12 DIAGNOSIS — H52201 Unspecified astigmatism, right eye: Secondary | ICD-10-CM | POA: Diagnosis not present

## 2020-01-12 DIAGNOSIS — N838 Other noninflammatory disorders of ovary, fallopian tube and broad ligament: Secondary | ICD-10-CM | POA: Diagnosis not present

## 2020-01-12 LAB — CBC WITH DIFFERENTIAL/PLATELET
Abs Immature Granulocytes: 0.03 10*3/uL (ref 0.00–0.07)
Basophils Absolute: 0.1 10*3/uL (ref 0.0–0.1)
Basophils Relative: 1 %
Eosinophils Absolute: 0.1 10*3/uL (ref 0.0–0.5)
Eosinophils Relative: 1 %
HCT: 39 % (ref 36.0–46.0)
Hemoglobin: 12.4 g/dL (ref 12.0–15.0)
Immature Granulocytes: 0 %
Lymphocytes Relative: 39 %
Lymphs Abs: 3.4 10*3/uL (ref 0.7–4.0)
MCH: 28 pg (ref 26.0–34.0)
MCHC: 31.8 g/dL (ref 30.0–36.0)
MCV: 88 fL (ref 80.0–100.0)
Monocytes Absolute: 0.7 10*3/uL (ref 0.1–1.0)
Monocytes Relative: 8 %
Neutro Abs: 4.4 10*3/uL (ref 1.7–7.7)
Neutrophils Relative %: 51 %
Platelets: 234 10*3/uL (ref 150–400)
RBC: 4.43 MIL/uL (ref 3.87–5.11)
RDW: 12.8 % (ref 11.5–15.5)
WBC: 8.8 10*3/uL (ref 4.0–10.5)
nRBC: 0 % (ref 0.0–0.2)

## 2020-01-12 LAB — URINALYSIS, MICROSCOPIC (REFLEX)

## 2020-01-12 LAB — URINALYSIS, ROUTINE W REFLEX MICROSCOPIC
Bilirubin Urine: NEGATIVE
Glucose, UA: NEGATIVE mg/dL
Ketones, ur: NEGATIVE mg/dL
Nitrite: NEGATIVE
Protein, ur: NEGATIVE mg/dL
Specific Gravity, Urine: >1.03 — ABNORMAL HIGH (ref 1.005–1.030)
pH: 5.5 (ref 5.0–8.0)

## 2020-01-12 LAB — COMPREHENSIVE METABOLIC PANEL
ALT: 17 U/L (ref 0–44)
AST: 17 U/L (ref 15–41)
Albumin: 4.1 g/dL (ref 3.5–5.0)
Alkaline Phosphatase: 58 U/L (ref 38–126)
Anion gap: 8 (ref 5–15)
BUN: 18 mg/dL (ref 6–20)
CO2: 26 mmol/L (ref 22–32)
Calcium: 8.7 mg/dL — ABNORMAL LOW (ref 8.9–10.3)
Chloride: 104 mmol/L (ref 98–111)
Creatinine, Ser: 0.82 mg/dL (ref 0.44–1.00)
GFR calc Af Amer: >60 mL/min (ref >60)
GFR calc non Af Amer: >60 mL/min (ref >60)
Glucose, Bld: 89 mg/dL (ref 70–99)
Potassium: 3.9 mmol/L (ref 3.5–5.1)
Sodium: 138 mmol/L (ref 135–145)
Total Bilirubin: 0.4 mg/dL (ref 0.3–1.2)
Total Protein: 7.1 g/dL (ref 6.5–8.1)

## 2020-01-12 LAB — LIPASE, BLOOD: Lipase: 47 U/L (ref 11–51)

## 2020-01-12 LAB — PREGNANCY, URINE: Preg Test, Ur: NEGATIVE

## 2020-01-12 MED ORDER — CEPHALEXIN 250 MG PO CAPS
500.0000 mg | ORAL_CAPSULE | Freq: Once | ORAL | Status: AC
  Administered 2020-01-12: 500 mg via ORAL
  Filled 2020-01-12: qty 2

## 2020-01-12 MED ORDER — CEPHALEXIN 500 MG PO CAPS
500.0000 mg | ORAL_CAPSULE | Freq: Four times a day (QID) | ORAL | 0 refills | Status: DC
Start: 2020-01-12 — End: 2020-01-12

## 2020-01-12 MED ORDER — CEPHALEXIN 500 MG PO CAPS
500.0000 mg | ORAL_CAPSULE | Freq: Four times a day (QID) | ORAL | 0 refills | Status: DC
Start: 2020-01-12 — End: 2020-01-19

## 2020-01-12 NOTE — Telephone Encounter (Signed)
   Pt is returning call to get echo results

## 2020-01-12 NOTE — ED Notes (Signed)
Patient transported to CT 

## 2020-01-12 NOTE — ED Provider Notes (Signed)
[facility] EMERGENCY DEPARTMENT Provider Note   CSN: [id number] Arrival date & time: 01/12/20  1532     History Chief Complaint  Patient presents with  . Flank Pain    [name] is a 49 y.o. female past medical history of GERD, hypertension who presents for evaluation of intermittent left flank pain over the last 2 to 3 months.  She states that over the last several months, she will get intermittent episodes where she has pain to the left flank.  States is mostly in the left side.  It does not radiate.  She states that she never takes any medication for it and eventually resolves on its own.  She states she does not know what brings it on and it occurs randomly.  She does do lifting sometimes and states that may contribute to the pain.  Her most recent episode started few days ago.  She states nothing is different or worse about the pain but states "I was tired of not knowing what it is," so she came to the emergency department.  She states it does hurt more when she moves and bends.  No other alleviating or exacerbating factors.  She states she has not had any fevers, chest pain, difficulty breathing, dysuria, hematuria, nausea/vomiting.  Denies any preceding trauma, injury, fall.  The history is provided by the patient.       Past Medical History:  Diagnosis Date  . Anemia   . Asthma    as child  . Back pain   . GERD (gastroesophageal reflux disease)   . Headache   . Hypertension   . Joint pain   . Migraines   . Parathyroid abnormality ([place])   . Swelling    feet and legs  . Vitamin D deficiency     Patient Active Problem List   Diagnosis Date Noted  . Encounter for counseling 12/01/2019  . Palpitations 12/01/2019  . Swelling of both lower extremities 12/01/2019  . Other constipation 05/08/2019  . Encounter for laboratory testing for COVID-19 virus 02/20/2019  . Encounter for annual physical examination excluding gynecological examination in a patient older  than 17 years 02/20/2019  . Plantar fasciitis 11/16/2018  . Cellulitis of umbilicus 04/21/5101  . Cystocele with incomplete uterovaginal prolapse 06/21/2018  . Overweight with body mass index (BMI) 25.0-29.9 05/11/2018  . Need for influenza vaccination 04/14/2018  . Other hyperlipidemia 09/01/2017  . 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 Elevated serum creatinine 05/26/2017  . Migraine without status migrainosus, not intractable 05/26/2017  . Atypical chest pain 04/13/2017  . Fibroids, intramural 04/05/2017  . Gastroesophageal reflux disease without esophagitis 04/05/2017  . History of migraine headaches 04/05/2017  . Major depressive disorder, recurrent episode ([place]) 04/05/2017  . Mild intermittent asthma without complication 58/52/7782  . Rhinosinusitis 04/05/2017  . Uterine prolapse 04/05/2017  . Depression 02/24/2017  . Hyperlipidemia 02/24/2017  . Essential hypertension 02/24/2017  . Class 1 obesity with serious comorbidity and body mass index (BMI) of 30.0 to 30.9 in adult 08/31/2016  . Vitamin D deficiency 08/31/2016  . 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 Chronic pain of both knees 10/18/2015    Past Surgical History:  Procedure Laterality Date  . ABDOMINAL HYSTERECTOMY    . ESOPHAGOGASTRODUODENOSCOPY  09/13/2003   Normal EGD.   Marland Kitchen FOOT SURGERY Bilateral 2001  . PARATHYROIDECTOMY N/A 01/03/2016   Procedure: PARATHYROIDECTOMY;  Surgeon: Leta Baptist, MD;  Location: [facility];  Service: ENT;  Laterality: N/A;  . TUBAL LIGATION  OB History    Gravida  2   Para  2   Term  2   Preterm      AB      Living  2     SAB      TAB      Ectopic      Multiple      Live Births              Family History  Problem Relation Age of Onset  . Hypertension Father   . Cancer Maternal Grandmother   . Heart disease Paternal Grandmother   . Cancer Paternal Grandmother   . Diabetes Paternal Grandmother   . Melanoma Maternal Grandfather   . Colon cancer  Neg Hx   . Colon polyps Neg Hx   . Esophageal cancer Neg Hx   . Rectal cancer Neg Hx   . Stomach cancer Neg Hx     Social History   Tobacco Use  . Smoking status: Never Smoker  . Smokeless tobacco: Never Used  Vaping Use  . Vaping Use: Never used  Substance Use Topics  . Alcohol use: No  . Drug use: No    Home Medications Prior to Admission medications   Medication Sig Start Date End Date Taking? Authorizing Provider  albuterol (PROAIR HFA) 108 (90 Base) MCG/ACT inhaler Inhale 1 puff into the lungs as needed. 08/08/18   [provider]  cephALEXin (KEFLEX) 500 MG capsule Take 1 capsule (500 mg total) by mouth 4 (four) times daily for 7 days. 01/12/20 01/19/20  Volanda Napoleon, PA-C  ibuprofen (ADVIL,MOTRIN) 400 MG tablet Take 1 tablet (400 mg total) by mouth 3 (three) times daily. Patient taking differently: Take 400 mg by mouth as needed.  09/15/16   Dennard Nip D, MD  Multiple Vitamin (MULTIVITAMIN) tablet Take 1 tablet by mouth daily.    [provider]  SUMAtriptan (IMITREX) 50 MG tablet Take 50 mg by mouth every 2 (two) hours as needed for migraine. May repeat in 2 hours if headache persists or recurs.    [provider]  Vitamin D, Ergocalciferol, (DRISDOL) 1.25 MG (50000 UT) CAPS capsule Take 1 capsule (50,000 Units total) by mouth every 7 (seven) days. 06/21/19   [place], Joneen Boers, FNP    Allergies    Patient has no known allergies.  Review of Systems   Review of Systems  Constitutional: Negative for fever.  Respiratory: Negative for cough and shortness of breath.   Cardiovascular: Negative for chest pain.  Gastrointestinal: Negative for abdominal pain, nausea and vomiting.  Genitourinary: Positive for flank pain. Negative for dysuria and hematuria.  Neurological: Negative for headaches.  All other systems reviewed and are negative.   Physical Exam Updated Vital Signs BP (!) 155/82 (BP Location: Right Arm)   Pulse 66   Temp 97.9 F  (36.6 C) (Oral)   Resp 16   Ht 5\' 6"  (1.676 m)   Wt 85.7 kg   LMP 05/11/2017   SpO2 99%   BMI 30.51 kg/m   Physical Exam Vitals and nursing note reviewed.  Constitutional:      Appearance: Normal appearance. She is well-developed.  HENT:     Head: Normocephalic and atraumatic.  Eyes:     General: Lids are normal.     Conjunctiva/sclera: Conjunctivae normal.     Pupils: Pupils are equal, round, and reactive to light.  Cardiovascular:     Rate and Rhythm: Normal rate and regular rhythm.  Pulses: Normal pulses.     Heart sounds: Normal heart sounds. No murmur heard.  No friction rub. No gallop.   Pulmonary:     Effort: Pulmonary effort is normal.     Breath sounds: Normal breath sounds.  Abdominal:     Palpations: Abdomen is soft. Abdomen is not rigid.     Tenderness: There is abdominal tenderness. There is no right CVA tenderness, left CVA tenderness or guarding.       Comments: Abdomen is soft, nondistended.  Point tenderness in the mid left abdomen.  No rigidity, guarding.  Musculoskeletal:        General: Normal range of motion.     Cervical back: Full passive range of motion without pain.  Skin:    General: Skin is warm and dry.     Capillary Refill: Capillary refill takes less than 2 seconds.  Neurological:     Mental Status: She is alert and oriented to person, place, and time.  Psychiatric:        Speech: Speech normal.     ED Results / Procedures / Treatments   Labs (all labs ordered are listed, but only abnormal results are displayed) Labs Reviewed  URINALYSIS, ROUTINE W REFLEX MICROSCOPIC - Abnormal; Notable for the following components:      Result Value   Specific Gravity, Urine >1.030 (*)    Hgb urine dipstick TRACE (*)    Leukocytes,Ua SMALL (*)    All other components within normal limits  COMPREHENSIVE METABOLIC PANEL - Abnormal; Notable for the following components:   Calcium 8.7 (*)    All other components within normal limits    URINALYSIS, MICROSCOPIC (REFLEX) - Abnormal; Notable for the following components:   Bacteria, UA MANY (*)    All other components within normal limits  CBC WITH DIFFERENTIAL/PLATELET  LIPASE, BLOOD  PREGNANCY, URINE    EKG None  Radiology CT Renal Stone Study  Result Date: 01/12/2020 CLINICAL DATA:  Left-sided flank pain for 2 months, nausea EXAM: CT ABDOMEN AND PELVIS WITHOUT CONTRAST TECHNIQUE: Multidetector CT imaging of the abdomen and pelvis was performed following the standard protocol without IV contrast. COMPARISON:  06/20/2003, 08/25/2018 FINDINGS: Lower chest: No acute pleural or parenchymal lung disease. Hepatobiliary: No focal liver abnormality is seen. No gallstones, gallbladder wall thickening, or biliary dilatation. Pancreas: Unremarkable. No pancreatic ductal dilatation or surrounding inflammatory changes. Spleen: Normal in size without focal abnormality. Adrenals/Urinary Tract: There is a 1.6 cm slightly hyperdense lesion within the upper pole right kidney corresponding to the Bosniak 55F lesion described on prior MRI. A 0.9 cm hypodensity within the midpole right kidney corresponds to the Bosniak type 2 cyst seen on prior MRI. There is a 7 mm nonobstructing calculus upper pole left kidney. The ureters, bladder, and adrenals are grossly unremarkable. Stomach/Bowel: No bowel obstruction or ileus. Normal appendix right lower quadrant. No bowel wall thickening or inflammatory change. Vascular/Lymphatic: No significant vascular findings are present. No enlarged abdominal or pelvic lymph nodes. Reproductive: Uterus is surgically absent. Left ovary is mildly enlarged measuring 5.0 x 4.0 by 3.5 cm, with multiple small cysts or follicles identified. Right ovary is grossly normal in appearance. Other: No free fluid or free gas.  No abdominal wall hernia. Musculoskeletal: No acute or destructive bony lesions. Reconstructed images demonstrate no additional findings. IMPRESSION: 1. Nonobstructing  7 mm left renal calculus. 2. Right renal cysts as above, not appreciably changed since prior exam. These were previously characterized on MRI, with follow-up recommendation made on  08/25/2018 MRI report. 3. Mild enlargement of the left ovary, with likely multiple functional follicles in this perimenopausal patient. If pain localizes to the left lower quadrant, pelvic ultrasound could be considered. Electronically Signed   By: Randa Ngo M.D.   On: 01/12/2020 18:18    Procedures Procedures (including critical care time)  Medications Ordered in ED Medications  cephALEXin (KEFLEX) capsule 500 mg (500 mg Oral Given 01/12/20 1837)    ED Course  I have reviewed the triage vital signs and the nursing notes.  Pertinent labs & imaging results that were available during my care of the patient were reviewed by me and considered in my medical decision making (see chart for details).    MDM Rules/Calculators/A&P                          49 year old female who presents for evaluation of intermittent left-sided flank pain x2 to 3 months.  No associated fevers, nausea/vomiting, urinary complaints.  Came in today because she wanted to figure out what it was.  No worsening or changing symptoms.  On initially arrival, she is afebrile nontoxic-appearing.  Vital signs are stable.  On exam, she has point tenderness noted to left flank.  Consider musculoskeletal as it is worse with movement.  Low suspicion for kidney stone given history/physical exam and duration of symptoms but also consideration.  Also consider GU etiology.  Plan to check labs, urine.  Lipase is normal.  CBC shows no leukocytosis or anemia.  CMP shows normal BUN and creatinine.  UA is negative for pregnancy.  UA does show trace hemoglobin, leukocytes, pyuria, bacteriuria.  UTI could be etiology of her symptoms.  Given that she is having flank pain as well as UTI, will plan for renal study for evaluation of any possible source of her UTI.  CT  scan shows 7 mm nonobstructing left renal stone.  She has renal cysts noted on the right kidney as well that were seen in previous MRI.  Additionally, there is mild enlargement of the left ovary with likely multiple functional follicles.  Discussed results with patient.  Patient is resting comfortably.  I palpated patient's abdomen and again.  She has no left lower quadrant abdominal pain.  It is all in the mid abdomen/flank area.  I did discuss with her regarding the findings of the ovarian cyst.  I did offer ultrasound evaluation here in the emergency department.  Patient would rather follow-up with her OB/GYN on an outpatient basis.  Given that patient has no left lower quadrant abdominal tenderness and is hemodynamically stable, I feel that this is reasonable.  We will plan to treat for UTI.  Patient with no known drug allergies.  Additionally, will plan to give referral to urology for kidney stone. At this time, patient exhibits no emergent life-threatening condition that require further evaluation in ED. Patient had ample opportunity for questions and discussion. All patient's questions were answered with full understanding. Strict return precautions discussed. Patient expresses understanding and agreement to plan.   Portions of this note were generated with [employer]. Dictation errors may occur despite best attempts at proofreading.  Final Clinical Impression(s) / ED Diagnoses Final diagnoses:  Acute cystitis with hematuria  Left flank pain  Cyst of left ovary  Kidney stone    Rx / DC Orders ED Discharge Orders         Ordered    cephALEXin (KEFLEX) 500 MG capsule  4 times  daily,   Status:  Discontinued     Reprint     01/12/20 1836    cephALEXin (KEFLEX) 500 MG capsule  4 times daily     Discontinue  Reprint     01/12/20 1836           Volanda Napoleon, PA-C 01/12/20 1845    Long, Wonda Old[facility], MD 01/20/20 2040

## 2020-01-12 NOTE — Telephone Encounter (Signed)
LMTCB regarding her Echo results- also sent a Mychart message to the patient with her results.

## 2020-01-12 NOTE — ED Triage Notes (Signed)
Left flank pain intermittently x2 months.  Episodes last a couple days.  This one has been going on 2 days. 1 episode of vomiting.  Went to work and eye doctor today prior to coming to ED.  Drove self to ED.

## 2020-01-12 NOTE — Discharge Instructions (Addendum)
Your lab work today looked reassuring.  As we discussed, your urine did show evidence of urinary tract infection which we will plan to treat.  Take antibiotics as directed. Please take all of your antibiotics until finished.  Additionally, as we discussed on your CT scan, there is evidence of a kidney stone in the left kidney that measures about 7 mm.  As we discussed, sometimes this can cause pain to outpatient referral to urology for further evaluation.  But does not become concerning until it tries to come in passed through the kidney.  I provided you outpatient referral to urology for further evaluation.  As we discussed, your CT scan also showed ovarian cyst.  Please follow-up with your OB/GYN regarding these cysts.  If you have any lower abdominal tenderness, return to emergency department.  Return to emergency department for any fever, vomiting, abdominal pain, difficulty breathing or any other worsening or concerning symptoms.

## 2020-01-12 NOTE — Telephone Encounter (Signed)
The patient has been notified of the result and verbalized understanding.  All questions (if any) were answered. Wilma Flavin, RN 01/12/2020 9:07 AM

## 2020-01-17 DIAGNOSIS — L814 Other melanin hyperpigmentation: Secondary | ICD-10-CM | POA: Diagnosis not present

## 2020-01-17 DIAGNOSIS — L821 Other seborrheic keratosis: Secondary | ICD-10-CM | POA: Diagnosis not present

## 2020-01-19 ENCOUNTER — Other Ambulatory Visit: Payer: Self-pay

## 2020-01-19 ENCOUNTER — Ambulatory Visit: Payer: 59 | Admitting: Cardiology

## 2020-01-19 ENCOUNTER — Encounter: Payer: Self-pay | Admitting: Cardiology

## 2020-01-19 VITALS — BP 146/80 | HR 81 | Ht 66.0 in | Wt 189.0 lb

## 2020-01-19 DIAGNOSIS — I471 Supraventricular tachycardia, unspecified: Secondary | ICD-10-CM

## 2020-01-19 DIAGNOSIS — E782 Mixed hyperlipidemia: Secondary | ICD-10-CM

## 2020-01-19 DIAGNOSIS — I10 Essential (primary) hypertension: Secondary | ICD-10-CM

## 2020-01-19 HISTORY — DX: Supraventricular tachycardia, unspecified: I47.10

## 2020-01-19 HISTORY — DX: Supraventricular tachycardia: I47.1

## 2020-01-19 MED ORDER — METOPROLOL SUCCINATE ER 25 MG PO TB24
25.0000 mg | ORAL_TABLET | Freq: Every day | ORAL | 1 refills | Status: DC
Start: 2020-01-19 — End: 2020-02-05

## 2020-01-19 NOTE — Progress Notes (Signed)
Cardiology Office Note:    Date:  01/19/2020   ID:  [name], DOB [birth year], MRN [id number]  PCP:  Myrlene Broker, MD  Cardiologist:  Jenne Campus, MD    Referring MD: Myrlene Broker, MD   No chief complaint on file. Doing well  History of Present Illness:    [name] is a 49 y.o. female comes today to discuss results of her test.  She did have monitor which showed some SVT as well as one episode of ventricular tachycardia but only 4 beats in a row.  She still described to have some palpitations.  Echocardiogram showed preserved left ventricle ejection fraction.  No dizziness no passing out.  Trying to be active and exercise nuclear complexes  Past Medical History:  Diagnosis Date  . Anemia   . Asthma    as child  . Back pain   . GERD (gastroesophageal reflux disease)   . Headache   . Hypertension   . Joint pain   . Migraines   . Parathyroid abnormality ([place])   . Swelling    feet and legs  . Vitamin D deficiency     Past Surgical History:  Procedure Laterality Date  . ABDOMINAL HYSTERECTOMY    . ESOPHAGOGASTRODUODENOSCOPY  09/13/2003   Normal EGD.   Marland Kitchen FOOT SURGERY Bilateral 2001  . PARATHYROIDECTOMY N/A 01/03/2016   Procedure: PARATHYROIDECTOMY;  Surgeon: Leta Baptist, MD;  Location: [facility];  Service: ENT;  Laterality: N/A;  . TUBAL LIGATION      Current Medications: Current Meds  Medication Sig  . albuterol (PROAIR HFA) 108 (90 Base) MCG/ACT inhaler Inhale 1 puff into the lungs as needed.  . Multiple Vitamin (MULTIVITAMIN) tablet Take 1 tablet by mouth as needed.   . SUMAtriptan (IMITREX) 50 MG tablet Take 50 mg by mouth every 2 (two) hours as needed for migraine. May repeat in 2 hours if headache persists or recurs.  . Vitamin D, Ergocalciferol, (DRISDOL) 1.25 MG (50000 UT) CAPS capsule Take 1 capsule (50,000 Units total) by mouth every 7 (seven) days.     Allergies:   Patient has no known allergies.   Social History    Socioeconomic History  . Marital status: Married    Spouse name: Ronnie Eppinger  . Number of children: Not on file  . Years of education: Not on file  . Highest education level: Not on file  Occupational History  . Occupation: CMA    Employer: [employer]  Tobacco Use  . Smoking status: Never Smoker  . Smokeless tobacco: Never Used  Vaping Use  . Vaping Use: Never used  Substance and Sexual Activity  . Alcohol use: No  . Drug use: No  . Sexual activity: Yes    Partners: Male    Birth control/protection: Surgical    Comment: TL  Other Topics Concern  . Not on file  Social History Narrative  . Not on file   Social Determinants of Health   Financial Resource Strain:   . Difficulty of Paying Living Expenses:   Food Insecurity:   . Worried About Charity fundraiser in the Last Year:   . Arboriculturist in the Last Year:   Transportation Needs:   . Film/video editor (Medical):   Marland Kitchen Lack of Transportation (Non-Medical):   Physical Activity:   . Days of Exercise per Week:   . Minutes of Exercise per Session:   Stress:   . Feeling of  Stress :   Social Connections:   . Frequency of Communication with Friends and Family:   . Frequency of Social Gatherings with Friends and Family:   . Attends Religious Services:   . Active Member of Clubs or Organizations:   . Attends Archivist Meetings:   Marland Kitchen Marital Status:      Family History: The patient's family history includes Cancer in her maternal grandmother and paternal grandmother; Diabetes in her paternal grandmother; Heart disease in her paternal grandmother; Hypertension in her father; Melanoma in her maternal grandfather. There is no history of Colon cancer, Colon polyps, Esophageal cancer, Rectal cancer, or Stomach cancer. ROS:   Please see the history of present illness.    All 14 point review of systems negative except as described per history of present illness  EKGs/Labs/Other Studies Reviewed:       Recent Labs: 01/12/2020: ALT 17; BUN 18; Creatinine, Ser 0.82; Hemoglobin 12.4; Platelets 234; Potassium 3.9; Sodium 138  Recent Lipid Panel    Component Value Date/Time   CHOL 205 (H) 02/06/2019 1010   TRIG 87 02/06/2019 1010   HDL 56 02/06/2019 1010   LDLCALC 132 (H) 02/06/2019 1010    Physical Exam:    VS:  BP (!) 146/80 (BP Location: Left Arm, Patient Position: Sitting, Cuff Size: Normal)   Pulse 81   Ht 5\' 6"  (1.676 m)   Wt 189 lb (85.7 kg)   LMP 05/11/2017   SpO2 97%   BMI 30.51 kg/m     Wt Readings from Last 3 Encounters:  01/19/20 189 lb (85.7 kg)  01/12/20 189 lb (85.7 kg)  12/01/19 192 lb 3.2 oz (87.2 kg)     GEN:  Well nourished, well developed in no acute distress HEENT: Normal NECK: No JVD; No carotid bruits LYMPHATICS: No lymphadenopathy CARDIAC: RRR, no murmurs, no rubs, no gallops RESPIRATORY:  Clear to auscultation without rales, wheezing or rhonchi  ABDOMEN: Soft, non-tender, non-distended MUSCULOSKELETAL:  No edema; No deformity  SKIN: Warm and dry LOWER EXTREMITIES: no swelling NEUROLOGIC:  Alert and oriented x 3 PSYCHIATRIC:  Normal affect   ASSESSMENT:    1. Essential hypertension   2. Mixed hyperlipidemia   3. SVT (supraventricular tachycardia) (HCC)    PLAN:    In order of problems listed above:  1. Essential hypertension.  I will put her on metoprolol succinate 25 mg daily that should help with blood pressure as well as supraventricular tachycardia.  It may be very beneficial for her migraines will see so how she respond to the. 2. Dyslipidemia: Her calculated risk is very low.  No need to start any statin just diet exercise and be active. 3. SVT: Will initiate therapy with beta-blocker.   Medication Adjustments/Labs and Tests Ordered: Current medicines are reviewed at length with the patient today.  Concerns regarding medicines are outlined above.  No orders of the defined types were placed in this encounter.  Medication  changes:  Meds ordered this encounter  Medications  . metoprolol succinate (TOPROL-XL) 25 MG 24 hr tablet    Sig: Take 1 tablet (25 mg total) by mouth daily. Take with or immediately following a meal.    Dispense:  90 tablet    Refill:  1    Signed, Park Liter, MD, [facility] 01/19/2020 4:37 PM    [place]

## 2020-01-19 NOTE — Patient Instructions (Signed)
Medication Instructions:  Your physician has recommended you make the following change in your medication:  START: Metoprolol succinate 25 mg daily   *If you need a refill on your cardiac medications before your next appointment, please call your pharmacy*   Lab Work: None  If you have labs (blood work) drawn today and your tests are completely normal, you will receive your results only by: Marland Kitchen MyChart Message (if you have MyChart) OR . A paper copy in the mail If you have any lab test that is abnormal or we need to change your treatment, we will call you to review the results.   Testing/Procedures: None.    Follow-Up: At [facility], you and your health needs are our priority.  As part of our continuing mission to provide you with exceptional heart care, we have created designated Provider Care Teams.  These Care Teams include your primary Cardiologist (physician) and Advanced Practice Providers (APPs -  Physician Assistants and Nurse Practitioners) who all work together to provide you with the care you need, when you need it.  We recommend signing up for the patient portal called "MyChart".  Sign up information is provided on this After Visit Summary.  MyChart is used to connect with patients for Virtual Visits (Telemedicine).  Patients are able to view lab/test results, encounter notes, upcoming appointments, etc.  Non-urgent messages can be sent to your provider as well.   To learn more about what you can do with MyChart, go to [email or web address].    Your next appointment:   3 month(s)  The format for your next appointment:   In Person  Provider:   Jenne Campus, MD   Other Instructions  Metoprolol Extended-Release Tablets What is this medicine? METOPROLOL (me TOE proe lole) is a beta blocker. It decreases the amount of work your heart has to do and helps your heart beat regularly. It treats high blood pressure and/or prevent chest pain (also called angina). It  also treats heart failure. This medicine may be used for other purposes; ask your health care provider or pharmacist if you have questions. COMMON BRAND NAME(S): toprol, Toprol XL What should I tell my health care provider before I take this medicine? They need to know if you have any of these conditions:  diabetes  heart or vessel disease like slow heart rate, worsening heart failure, heart block, sick sinus syndrome or Raynaud's disease  kidney disease  liver disease  lung or breathing disease, like asthma or emphysema  pheochromocytoma  thyroid disease  an unusual or allergic reaction to metoprolol, other beta-blockers, medicines, foods, dyes, or preservatives  pregnant or trying to get pregnant  breast-feeding How should I use this medicine? Take this drug by mouth. Take it as directed on the prescription label at the same time every day. Take it with food. You may cut the tablet in half if it is scored (has a line in the middle of it). This may help you swallow the tablet if the whole tablet is too big. Be sure to take both halves. Do not take just one-half of the tablet. Keep taking it unless your health care provider tells you to stop. Talk to your health care provider about the use of this drug in children. While it may be prescribed for children as young as 6 for selected conditions, precautions do apply. Overdosage: If you think you have taken too much of this medicine contact a poison control center or emergency room at once. NOTE: This medicine  is only for you. Do not share this medicine with others. What if I miss a dose? If you miss a dose, take it as soon as you can. If it is almost time for your next dose, take only that dose. Do not take double or extra doses. What may interact with this medicine? This medicine may interact with the following medications:  certain medicines for blood pressure, heart disease, irregular heart beat  certain medicines for depression,  like monoamine oxidase (MAO) inhibitors, fluoxetine, or paroxetine  clonidine  dobutamine  epinephrine  isoproterenol  reserpine This list may not describe all possible interactions. Give your health care provider a list of all the medicines, herbs, non-prescription drugs, or dietary supplements you use. Also tell them if you smoke, drink alcohol, or use illegal drugs. Some items may interact with your medicine. What should I watch for while using this medicine? Visit your doctor or health care professional for regular check ups. Contact your doctor right away if your symptoms worsen. Check your blood pressure and pulse rate regularly. Ask your health care professional what your blood pressure and pulse rate should be, and when you should contact them. You may get drowsy or dizzy. Do not drive, use machinery, or do anything that needs mental alertness until you know how this medicine affects you. Do not sit or stand up quickly, especially if you are an older patient. This reduces the risk of dizzy or fainting spells. Contact your doctor if these symptoms continue. Alcohol may interfere with the effect of this medicine. Avoid alcoholic drinks. This medicine may increase blood sugar. Ask your healthcare provider if changes in diet or medicines are needed if you have diabetes. What side effects may I notice from receiving this medicine? Side effects that you should report to your doctor or health care professional as soon as possible:  allergic reactions like skin rash, itching or hives  cold or numb hands or feet  depression  difficulty breathing  faint  fever with sore throat  irregular heartbeat, chest pain  rapid weight gain   signs and symptoms of high blood sugar such as being more thirsty or hungry or having to urinate more than normal. You may also feel very tired or have blurry vision.  swollen legs or ankles Side effects that usually do not require medical attention  (report to your doctor or health care professional if they continue or are bothersome):  anxiety or nervousness  change in sex drive or performance  dry skin  headache  nightmares or trouble sleeping  short term memory loss  stomach upset or diarrhea This list may not describe all possible side effects. Call your doctor for medical advice about side effects. You may report side effects to FDA at 1-800-FDA-1088. Where should I keep my medicine? Keep out of the reach of children and pets. Store at room temperature between 20 and 25 degrees C (68 and 77 degrees F). Throw away any unused drug after the expiration date. NOTE: This sheet is a summary. It may not cover all possible information. If you have questions about this medicine, talk to your doctor, pharmacist, or health care provider.  2020 Elsevier/Gold Standard (2019-02-02 18:23:00)

## 2020-01-31 DIAGNOSIS — N200 Calculus of kidney: Secondary | ICD-10-CM | POA: Diagnosis not present

## 2020-01-31 DIAGNOSIS — R1032 Left lower quadrant pain: Secondary | ICD-10-CM | POA: Diagnosis not present

## 2020-01-31 DIAGNOSIS — R8271 Bacteriuria: Secondary | ICD-10-CM | POA: Diagnosis not present

## 2020-01-31 DIAGNOSIS — N281 Cyst of kidney, acquired: Secondary | ICD-10-CM | POA: Diagnosis not present

## 2020-02-02 ENCOUNTER — Other Ambulatory Visit: Payer: Self-pay | Admitting: Urology

## 2020-02-02 ENCOUNTER — Telehealth: Payer: Self-pay | Admitting: Cardiology

## 2020-02-02 NOTE — Telephone Encounter (Signed)
Called patient. She reports ever since starting metoprolol succinate that she has been having headaches and nausea in the morning. She takes the medicine when she goes to bed. She wants to know if she should stop the medication. No other symptoms. Will consult with Dr. Agustin Cree.

## 2020-02-02 NOTE — Telephone Encounter (Signed)
Pt c/o medication issue:  1. Name of Medication: metoprolol succinate (TOPROL-XL) 25 MG 24 hr tablet  2. How are you currently taking this medication (dosage and times per day)? As directed  3. Are you having a reaction (difficulty breathing--STAT)? No  4. What is your medication issue? Patient states the medication is causing daily migraines and nausea. Please call to discuss.

## 2020-02-05 ENCOUNTER — Other Ambulatory Visit: Payer: Self-pay | Admitting: Urology

## 2020-02-05 MED ORDER — DILTIAZEM HCL ER COATED BEADS 120 MG PO CP24
120.0000 mg | ORAL_CAPSULE | Freq: Every day | ORAL | 1 refills | Status: DC
Start: 2020-02-05 — End: 2020-03-12

## 2020-02-05 MED FILL — CARTIA XT 120 MG CP24: 120 | 90 days supply | Qty: 90 | Fill #0

## 2020-02-05 NOTE — Telephone Encounter (Signed)
Called patient informed her that per Dr. Agustin Cree she can stop metoprolol and start cardizem 120 mg daily. Patient verbally understood. No further questions.

## 2020-02-05 NOTE — Telephone Encounter (Signed)
That would be unusual to cause migraine by metoprolol but it is possible.  Please stop metoprolol and start next day Cardizem CD 120 mg daily

## 2020-02-08 ENCOUNTER — Other Ambulatory Visit (HOSPITAL_COMMUNITY)
Admission: RE | Admit: 2020-02-08 | Discharge: 2020-02-08 | Disposition: A | Discharge status: Home / Self Care | Payer: 59 | Source: Ambulatory Visit | Attending: Urology | Admitting: Urology

## 2020-02-08 DIAGNOSIS — Z20822 Contact with and (suspected) exposure to covid-19: Secondary | ICD-10-CM | POA: Insufficient documentation

## 2020-02-08 DIAGNOSIS — Z01812 Encounter for preprocedural laboratory examination: Secondary | ICD-10-CM | POA: Insufficient documentation

## 2020-02-08 LAB — SARS CORONAVIRUS 2 (TAT 6-24 HRS): SARS Coronavirus 2: NEGATIVE

## 2020-02-09 NOTE — Progress Notes (Signed)
Patient to arrive at 0600 on 02/12/2020. History and medications reviewed. Pre-procedure instructions given. NPO after MN. Patient takes her BP medication at noon. Driver secured.

## 2020-02-09 NOTE — H&P (Signed)
Office Visit Report     01/31/2020   --------------------------------------------------------------------------------   [name]  MRN: [id number]  DOB: July 16, 1970, 49 year old Female  SSN:    PRIMARY CARE:  Herbie [place] A. Unk Lightning, MD  REFERRING:    PROVIDER:  Ellison Hughs, M.D.  LOCATION:  [facility] [phone]     --------------------------------------------------------------------------------   CC/HPI: Left lower quadrant pain   [name] is a 49 year old female who was recently seen in the emergency department on 01/12/2020 for a 2 month history of intermittent episodes of dull, nonradiating left lower quadrant pain that she rates as a 5 out of 10. She had a CT stone study at that time that revealed a 7 mm nonobstructing left renal calculus as well as a (cyst. She has no prior history of kidney stones, but does report a history of hyperparathyroidism and is s/p parathyroidectomy in 2017. 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 From a urinary standpoint, patient reported if this remains like she is emptying her bladder well. She denies prior issues with UTIs, dysuria or gross hematuria. She is interested in having her left renal stone treated.     ALLERGIES: None   MEDICATIONS: Metoprolol Succinate 25 mg tablet, extended release 24 hr  Imitrex 50 mg tablet  Vitamin C  Vitamin D3     GU PSH: None     PSH Notes: Bunions B feet    NON-GU PSH: Anesth, Bladder Surgery Hysterectomy, PARTIAL  Parathyroidectomy, Right     GU PMH: None   NON-GU PMH: Asthma GERD Hypertension    FAMILY HISTORY: 2 sons - No Family History   SOCIAL HISTORY: Marital Status: Married Preferred Language: English; Race: White Current Smoking Status: Patient does not smoke anymore.   Tobacco Use Assessment Completed: Used  Tobacco in last 30 days? Drinks 3 drinks per year.  Does not drink caffeine.    REVIEW OF SYSTEMS:    GU Review Female:   Patient denies frequent urination, hard to postpone urination, burning /pain with urination, get up at night to urinate, leakage of urine, stream starts and stops, trouble starting your stream, have to strain to urinate, and being pregnant.  Gastrointestinal (Upper):   Patient reports nausea and vomiting. Patient denies indigestion/ heartburn.  Gastrointestinal (Lower):   Patient denies diarrhea and constipation.  Constitutional:   Patient reports night sweats. Patient denies fever, weight loss, and fatigue.  Skin:   Patient denies skin rash/ lesion and itching.  Eyes:   Patient denies blurred vision and double vision.  Ears/ Nose/ Throat:   Patient denies sore throat and sinus problems.  Hematologic/Lymphatic:   Patient denies swollen glands and easy bruising.  Cardiovascular:   Patient denies chest pains and leg swelling.  Respiratory:   Patient denies cough and shortness of breath.  Endocrine:   Patient denies excessive thirst.  Musculoskeletal:   Patient reports back pain. Patient denies joint pain.  Neurological:   Patient reports headaches and dizziness.   Psychologic:   Patient denies depression and anxiety.   Notes: L side flank pain and lower abdomen , UTI 2 weeks ago, painful intercourse after hysterectomy and bladder tact     VITAL SIGNS:      01/31/2020 11:32 AM  Weight 189 lb / 85.73 kg  Height 66  in / 167.64 cm  BP 129/87 mmHg  Heart Rate 76 /min  Temperature 98.0 F / 36.6 C  BMI 30.5 kg/m   MULTI-SYSTEM PHYSICAL EXAMINATION:    Constitutional: Well-nourished. No physical deformities. Normally developed. Good grooming.  Neurologic / Psychiatric: Oriented to time, oriented to place, oriented to person. No depression, no anxiety, no agitation.  Musculoskeletal: Normal gait and station of head and neck.     Complexity of Data:  X-Ray Review:  C.T. Abdomen/Pelvis: Reviewed Films. Reviewed Report. Discussed With Patient.  MRI Abdomen: Reviewed Films. Reviewed Report. Discussed With Patient.    Notes:                     CLINICAL DATA: Left-sided flank pain for 2 months, nausea     EXAM:  CT ABDOMEN AND PELVIS WITHOUT CONTRAST     TECHNIQUE:  Multidetector CT imaging of the abdomen and pelvis was performed  following the standard protocol without IV contrast.     COMPARISON: 06/20/2003, 08/25/2018     FINDINGS:  Lower chest: No acute pleural or parenchymal lung disease.     Hepatobiliary: No focal liver abnormality is seen. No gallstones,  gallbladder wall thickening, or biliary dilatation.     Pancreas: Unremarkable. No pancreatic ductal dilatation or  surrounding inflammatory changes.     Spleen: Normal in size without focal abnormality.     Adrenals/Urinary Tract: There is a 1.6 cm slightly hyperdense lesion  within the upper pole right kidney corresponding to the Bosniak 61F  lesion described on prior MRI. A 0.9 cm hypodensity within the  midpole right kidney corresponds to the Bosniak type 2 cyst seen on  prior MRI.     There is a 7 mm nonobstructing calculus upper pole left kidney.     The ureters, bladder, and adrenals are grossly unremarkable.     Stomach/Bowel: No bowel obstruction or ileus. Normal appendix right  lower quadrant. No bowel wall thickening or inflammatory change.     Vascular/Lymphatic: No significant vascular findings are present. No  enlarged abdominal or pelvic lymph nodes.     Reproductive: Uterus is surgically absent. Left ovary is mildly  enlarged measuring 5.0 x 4.0 by 3.5 cm, with multiple small cysts or  follicles identified. Right ovary is grossly normal in appearance.     Other: No free fluid or free gas. No abdominal wall hernia.     Musculoskeletal: No acute or destructive bony lesions. Reconstructed  images demonstrate no additional findings.     IMPRESSION:  1.  Nonobstructing 7 mm left renal calculus.  2. Right renal cysts as above, not appreciably changed since prior  exam. These were previously characterized on MRI, with follow-up  recommendation made on 08/25/2018 MRI report.  3. Mild enlargement of the left ovary, with likely multiple  functional follicles in this perimenopausal patient. If pain  localizes to the left lower quadrant, pelvic ultrasound could be  considered.        Electronically Signed  By: Randa Ngo M.D.  On: 01/12/2020 18:18   ____________________________________________________________________________   CLINICAL DATA: Right upper quadrant and epigastric abdominal pain.  Complex right renal cystic lesions for further assessment.     EXAM:  MRI ABDOMEN WITHOUT AND WITH CONTRAST     TECHNIQUE:  Multiplanar multisequence MR imaging of the abdomen was performed  both before and after the administration of intravenous contrast.     CONTRAST: 11mL MULTIHANCE GADOBENATE DIMEGLUMINE 529 MG/ML IV SOLN  Creatinine was obtained on site at [facility] at [address]     Results: Creatinine 1.0 mg/dL.     COMPARISON: 07/09/2018 and 06/20/2003     FINDINGS:  Lower chest: Unremarkable     Hepatobiliary: The small polyp shown on ultrasound of the  gallbladder is not well seen on today's MRI. The liver appears  unremarkable.     Pancreas: Pancreas divisum.     Spleen: Unremarkable     Adrenals/Urinary Tract: A 1.6 by 1.1 cm lesion of the right kidney  upper pole has at least 2 intersecting septa but no nodularity.  Faintly accentuated precontrast T1 signal along the lateral margin  on image 43/10.     A 0.7 cm Bosniak category 2 cyst of the right mid to lower kidney is  present on image 64/10 with high precontrast T1 signal  characteristics but without appreciable enhancement.     The left kidney and adrenal glands appear unremarkable.     Stomach/Bowel: Unremarkable      Vascular/Lymphatic: Unremarkable     Other: No supplemental non-categorized findings.     Musculoskeletal: Unremarkable     IMPRESSION:  1. Bosniak category 50F cyst of the right kidney upper pole measuring  1.6 by 1.1 cm, with at least 2 intersecting septations but no  internal nodularity. This lesion is probably benign but warrants  surveillance. Follow up renal protocol CT or MRI is recommended in 6  months time. This recommendation follows ACR consensus guidelines:  Management of the Incidental Renal Mass on CT: A White Paper of the  ACR Incidental Findings Committee. J Am Coll Radiol [phone].  2. A separate Bosniak category 2 benign cyst of the right mid to  lower kidney is present.  3. Pancreas divisum.        Electronically Signed  By: Van Clines M.D.  On: 08/25/2018 13:29   PROCEDURES:          Urinalysis w/Scope Dipstick Dipstick Cont'd Micro  Color: Yellow Bilirubin: Neg mg/dL WBC/hpf: 0 - 5/hpf  Appearance: Clear Ketones: Neg mg/dL RBC/hpf: NS (Not Seen)  Specific Gravity: 1.020 Blood: Neg ery/uL Bacteria: Few (10-25/hpf)  pH: 6.5 Protein: Neg mg/dL Cystals: NS (Not Seen)  Glucose: Neg mg/dL Urobilinogen: 0.2 mg/dL Casts: NS (Not Seen)    Nitrites: Neg Trichomonas: Not Present    Leukocyte Esterase: 1+ leu/uL Mucous: Not Present      Epithelial Cells: NS (Not Seen)      Yeast: NS (Not Seen)      Sperm: Not Present    ASSESSMENT:      ICD-10 Details  1 GU:   Renal calculus - N20.0 Left, Undiagnosed New Problem - 7 mm nonobstructing left renal stone. 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 Stable on recent cross-sectional imaging  3   LLQ pain - R10.32 Undiagnosed New Problem   PLAN:           Orders Labs Urine Culture          Schedule Return Visit/Planned Activity: Next Available Appointment - Schedule Surgery          Document Letter(s):  Created for Patient:  Clinical Summary   Created for Robert A. Unk Lightning, MD         Notes:   The risks, benefits and alternatives of LEFT ESWL was discussed with the patient. I described the risks which  include arrhythmia, kidney contusion, kidney hemorrhage, need for transfusion, back discomfort, flank ecchymosis, flank abrasion, inability to break up stone, inability to pass stone fragments, Steinstrasse, infection associated with obstructing stones, need for different surgical procedure and possible need for repeat shockwave lithotripsy. The patient voices understanding and wishes to proceed.  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 on 01/31/20 at 12:32 PM (EDT*     The information contained in this medical record document is considered private and confidential patient information. This information can only be used for the medical diagnosis and/or medical services that are being provided by the patient's selected caregivers. This information can only be distributed outside of the patient's care if the patient agrees and signs waivers of authorization for this information to be sent to an outside source or route.  Add; Urine cx negative.

## 2020-02-12 ENCOUNTER — Encounter (HOSPITAL_BASED_OUTPATIENT_CLINIC_OR_DEPARTMENT_OTHER): Payer: Self-pay | Admitting: Urology

## 2020-02-12 ENCOUNTER — Ambulatory Visit (HOSPITAL_COMMUNITY): Payer: 59

## 2020-02-12 ENCOUNTER — Encounter (HOSPITAL_BASED_OUTPATIENT_CLINIC_OR_DEPARTMENT_OTHER)
Admission: RE | Disposition: A | Discharge status: Home / Self Care | Payer: Self-pay | Source: Home / Self Care | Attending: Urology

## 2020-02-12 ENCOUNTER — Ambulatory Visit (HOSPITAL_BASED_OUTPATIENT_CLINIC_OR_DEPARTMENT_OTHER)
Admission: RE | Admit: 2020-02-12 | Discharge: 2020-02-12 | Disposition: A | Discharge status: Home / Self Care | Payer: 59 | Attending: Urology | Admitting: Urology

## 2020-02-12 DIAGNOSIS — Z87442 Personal history of urinary calculi: Secondary | ICD-10-CM | POA: Diagnosis not present

## 2020-02-12 DIAGNOSIS — J45909 Unspecified asthma, uncomplicated: Secondary | ICD-10-CM | POA: Diagnosis not present

## 2020-02-12 DIAGNOSIS — Q453 Other congenital malformations of pancreas and pancreatic duct: Secondary | ICD-10-CM | POA: Diagnosis not present

## 2020-02-12 DIAGNOSIS — I10 Essential (primary) hypertension: Secondary | ICD-10-CM | POA: Insufficient documentation

## 2020-02-12 DIAGNOSIS — N200 Calculus of kidney: Secondary | ICD-10-CM | POA: Diagnosis not present

## 2020-02-12 DIAGNOSIS — Z79899 Other long term (current) drug therapy: Secondary | ICD-10-CM | POA: Insufficient documentation

## 2020-02-12 DIAGNOSIS — Z87891 Personal history of nicotine dependence: Secondary | ICD-10-CM | POA: Insufficient documentation

## 2020-02-12 DIAGNOSIS — N281 Cyst of kidney, acquired: Secondary | ICD-10-CM | POA: Insufficient documentation

## 2020-02-12 DIAGNOSIS — I878 Other specified disorders of veins: Secondary | ICD-10-CM | POA: Diagnosis not present

## 2020-02-12 HISTORY — PX: EXTRACORPOREAL SHOCK WAVE LITHOTRIPSY: SHX1557

## 2020-02-12 SURGERY — LITHOTRIPSY, ESWL
Anesthesia: LOCAL | Laterality: Left

## 2020-02-12 MED ORDER — DIPHENHYDRAMINE HCL 25 MG PO CAPS
25.0000 mg | ORAL_CAPSULE | ORAL | Status: AC
  Administered 2020-02-12: 25 mg via ORAL

## 2020-02-12 MED ORDER — DIPHENHYDRAMINE HCL 25 MG PO CAPS
ORAL_CAPSULE | ORAL | Status: AC
  Filled 2020-02-12: qty 1

## 2020-02-12 MED ORDER — DIAZEPAM 5 MG PO TABS
ORAL_TABLET | ORAL | Status: AC
  Filled 2020-02-12: qty 2

## 2020-02-12 MED ORDER — DIAZEPAM 5 MG PO TABS
10.0000 mg | ORAL_TABLET | ORAL | Status: AC
  Administered 2020-02-12: 10 mg via ORAL

## 2020-02-12 MED ORDER — CIPROFLOXACIN HCL 500 MG PO TABS
ORAL_TABLET | ORAL | Status: AC
  Filled 2020-02-12: qty 1

## 2020-02-12 MED ORDER — SODIUM CHLORIDE 0.9 % IV SOLN: INTRAVENOUS | Status: DC

## 2020-02-12 MED ORDER — CIPROFLOXACIN HCL 500 MG PO TABS
500.0000 mg | ORAL_TABLET | ORAL | Status: AC
  Administered 2020-02-12: 500 mg via ORAL

## 2020-02-12 NOTE — Discharge Instructions (Signed)
Lithotripsy, Care After This sheet gives you information about how to care for yourself after your procedure. Your health care provider may also give you more specific instructions. If you have problems or questions, contact your health care provider. What can I expect after the procedure? After the procedure, it is common to have:  Some blood in your urine. This should only last for a few days.  Soreness in your back, sides, or upper abdomen for a few days.  Blotches or bruises on your back where the pressure wave entered the skin.  Pain, discomfort, or nausea when pieces (fragments) of the kidney stone move through the tube that carries urine from the kidney to the bladder (ureter). Stone fragments may pass soon after the procedure, but they may continue to pass for up to 4-8 weeks. ? If you have severe pain or nausea, contact your health care provider. This may be caused by a large stone that was not broken up, and this may mean that you need more treatment.  Some pain or discomfort during urination.  Some pain or discomfort in the lower abdomen or (in men) at the base of the penis. Follow these instructions at home: Medicines  Take over-the-counter and prescription medicines only as told by your health care provider.  If you were prescribed an antibiotic medicine, take it as told by your health care provider. Do not stop taking the antibiotic even if you start to feel better.  Do not drive for 24 hours if you were given a medicine to help you relax (sedative).  Do not drive or use heavy machinery while taking prescription pain medicine. Eating and drinking      Drink enough water and fluids to keep your urine clear or pale yellow. This helps any remaining pieces of the stone to pass. It can also help prevent new stones from forming.  Eat plenty of fresh fruits and vegetables.  Follow instructions from your health care provider about eating and drinking restrictions. You may be  instructed: ? To reduce how much salt (sodium) you eat or drink. Check ingredients and nutrition facts on packaged foods and beverages. ? To reduce how much meat you eat.  Eat the recommended amount of calcium for your age and gender. Ask your health care provider how much calcium you should have. General instructions  Get plenty of rest.  Most people can resume normal activities 1-2 days after the procedure. Ask your health care provider what activities are safe for you.  Your health care provider may direct you to lie in a certain position (postural drainage) and tap firmly (percuss) over your kidney area to help stone fragments pass. Follow instructions as told by your health care provider.  If directed, strain all urine through the strainer that was provided by your health care provider. ? Keep all fragments for your health care provider to see. Any stones that are found may be sent to a medical lab for examination. The stone may be as small as a grain of salt.  Keep all follow-up visits as told by your health care provider. This is important. Contact a health care provider if:  You have pain that is severe or does not get better with medicine.  You have nausea that is severe or does not go away.  You have blood in your urine longer than your health care provider told you to expect.  You have more blood in your urine.  You have pain during urination that does   not go away.  You urinate more frequently than usual and this does not go away.  You develop a rash or any other possible signs of an allergic reaction. Get help right away if:  You have severe pain in your back, sides, or upper abdomen.  You have severe pain while urinating.  Your urine is very dark red.  You have blood in your stool (feces).  You cannot pass any urine at all.  You feel a strong urge to urinate after emptying your bladder.  You have a fever or chills.  You develop shortness of breath,  difficulty breathing, or chest pain.  You have severe nausea that leads to persistent vomiting.  You faint. Summary  After this procedure, it is common to have some pain, discomfort, or nausea when pieces (fragments) of the kidney stone move through the tube that carries urine from the kidney to the bladder (ureter). If this pain or nausea is severe, however, you should contact your health care provider.  Most people can resume normal activities 1-2 days after the procedure. Ask your health care provider what activities are safe for you.  Drink enough water and fluids to keep your urine clear or pale yellow. This helps any remaining pieces of the stone to pass, and it can help prevent new stones from forming.  If directed, strain your urine and keep all fragments for your health care provider to see. Fragments or stones may be as small as a grain of salt.  Get help right away if you have severe pain in your back, sides, or upper abdomen or have severe pain while urinating. This information is not intended to replace advice given to you by your health care provider. Make sure you discuss any questions you have with your health care provider. Document Revised: 10/03/2018 Document Reviewed: 05/13/2016 Elsevier Patient Education  2020 [employer]  Post Anesthesia Home Care Instructions  Activity: Get plenty of rest for the remainder of the day. A responsible individual must stay with you for 24 hours following the procedure.  For the next 24 hours, DO NOT: -Drive a car -Operate machinery -Drink alcoholic beverages -Take any medication unless instructed by your physician -Make any legal decisions or sign important papers.  Meals: Start with liquid foods such as gelatin or soup. Progress to regular foods as tolerated. Avoid greasy, spicy, heavy foods. If nausea and/or vomiting occur, drink only clear liquids until the nausea and/or vomiting subsides. Call your physician if vomiting  continues.      

## 2020-02-12 NOTE — Interval H&P Note (Signed)
History and Physical Interval Note:  02/12/2020 7:42 AM  [name]  has presented today for surgery, with the diagnosis of LEFT RENAL STONE.  The various methods of treatment have been discussed with the patient and family. After consideration of risks, benefits and other options for treatment, the patient has consented to  Procedure(s): EXTRACORPOREAL SHOCK WAVE LITHOTRIPSY (ESWL) (Left) as a surgical intervention.  The patient's history has been reviewed, patient examined, no change in status, stable for surgery.  I have reviewed the patient's chart and labs.  Questions were answered to the patient's satisfaction.  She said she definitely did not want to continue surveillance and risk stone growth or passage. We discussed nature r/b/a to ESWL. She is well with no fever or dysuria.    Festus Aloe

## 2020-02-12 NOTE — Op Note (Signed)
Left ESWL   Left Lower Pole 7 mm renal stone  Findings: stone fragmented well, but she may need a staged procedure if she fails to pass stone fragments.

## 2020-02-13 ENCOUNTER — Encounter (HOSPITAL_BASED_OUTPATIENT_CLINIC_OR_DEPARTMENT_OTHER): Payer: Self-pay | Admitting: Urology

## 2020-02-19 DIAGNOSIS — H5203 Hypermetropia, bilateral: Secondary | ICD-10-CM | POA: Diagnosis not present

## 2020-02-21 ENCOUNTER — Encounter: Payer: 59 | Admitting: Surgical

## 2020-02-28 ENCOUNTER — Encounter: Payer: Self-pay | Admitting: Surgical

## 2020-02-28 ENCOUNTER — Ambulatory Visit (INDEPENDENT_AMBULATORY_CARE_PROVIDER_SITE_OTHER): Payer: 59 | Admitting: Surgical

## 2020-02-28 ENCOUNTER — Other Ambulatory Visit: Payer: Self-pay

## 2020-02-28 VITALS — BP 132/77 | HR 82 | Temp 98.5°F | Ht 66.0 in | Wt 190.2 lb

## 2020-02-28 DIAGNOSIS — H02831 Dermatochalasis of right upper eyelid: Secondary | ICD-10-CM

## 2020-02-28 DIAGNOSIS — H02834 Dermatochalasis of left upper eyelid: Secondary | ICD-10-CM

## 2020-02-28 MED ORDER — CEPHALEXIN 500 MG PO CAPS: 500.0000 mg | ORAL_CAPSULE | Freq: Four times a day (QID) | ORAL | 0 refills | Status: AC

## 2020-02-28 MED ORDER — ONDANSETRON HCL 4 MG PO TABS: 4.0000 mg | ORAL_TABLET | Freq: Three times a day (TID) | ORAL | 0 refills | Status: DC | PRN

## 2020-02-28 MED ORDER — HYDROCODONE-ACETAMINOPHEN 5-325 MG PO TABS: 1.0000 | ORAL_TABLET | Freq: Four times a day (QID) | ORAL | 0 refills | Status: AC | PRN

## 2020-02-28 MED FILL — HYDROCODON-APAP 5-325: 5-325 | 5 days supply | Qty: 20 | Fill #0

## 2020-02-28 MED FILL — ONDANSETRON HCL 4 MG TABLET: 4 | 7 days supply | Qty: 20 | Fill #0

## 2020-02-28 MED FILL — CEPHALEXIN 500 MG CAPSULE: 500 | 3 days supply | Qty: 12 | Fill #0

## 2020-02-28 NOTE — Progress Notes (Signed)
Patient ID: [name], female    DOB: [birth year], 49 y.o.   MRN: [id number]  Chief Complaint  Patient presents with  . Pre-op Exam      ICD-10-CM   1. Dermatochalasis of both upper eyelids  H02.831    H02.834      History of Present Illness: [name] is a 49 y.o.  female  with a history of dermatochalasis causing visual changes. She presents for preoperative evaluation for upcoming procedure, bilateral upper lid blepharoplasty, scheduled for 03/13/2020 with Dr. Marla Roe  Patient reports previous history of postoperative nausea vomiting, otherwise does well with anesthesia.  She reports she has had the scopolamine patch in the past which has been very helpful. No history of DVT/PE.  No family history of DVT/PE.  No family or personal history of bleeding or clotting disorders.  Patient is not currently taking any blood thinners.  No history of CVA/MI.   PMH Significant for: Anemia, GERD, hypertension (well controlled per patient), migraines.    Past Medical History: Allergies: No Known Allergies  Current Medications:  Current Outpatient Medications:  .  albuterol (PROAIR HFA) 108 (90 Base) MCG/ACT inhaler, Inhale 1 puff into the lungs as needed., Disp: , Rfl:  .  diltiazem (CARDIZEM CD) 120 MG 24 hr capsule, Take 1 capsule (120 mg total) by mouth daily., Disp: 90 capsule, Rfl: 1 .  Multiple Vitamin (MULTIVITAMIN) tablet, Take 1 tablet by mouth as needed. , Disp: , Rfl:  .  SUMAtriptan (IMITREX) 50 MG tablet, Take 50 mg by mouth every 2 (two) hours as needed for migraine. May repeat in 2 hours if headache persists or recurs., Disp: , Rfl:  .  Vitamin D, Ergocalciferol, (DRISDOL) 1.25 MG (50000 UT) CAPS capsule, Take 1 capsule (50,000 Units total) by mouth every 7 (seven) days., Disp: 4 capsule, Rfl: 0 .  cephALEXin (KEFLEX) 500 MG capsule, Take 1 capsule (500 mg total) by mouth 4 (four) times daily for 3 days., Disp: 12 capsule, Rfl: 0 .  HYDROcodone-acetaminophen (NORCO) 5-325  MG tablet, Take 1 tablet by mouth every 6 (six) hours as needed for up to 5 days for severe pain., Disp: 20 tablet, Rfl: 0 .  ondansetron (ZOFRAN) 4 MG tablet, Take 1 tablet (4 mg total) by mouth every 8 (eight) hours as needed for nausea or vomiting., Disp: 20 tablet, Rfl: 0  Past Medical Problems: Past Medical History:  Diagnosis Date  . Anemia   . Asthma    as child  . Back pain   . GERD (gastroesophageal reflux disease)   . Headache   . Hypertension   . Joint pain   . Migraines   . Parathyroid abnormality ([place])   . Swelling    feet and legs  . Vitamin D deficiency     Past Surgical History: Past Surgical History:  Procedure Laterality Date  . ABDOMINAL HYSTERECTOMY    . ESOPHAGOGASTRODUODENOSCOPY  09/13/2003   Normal EGD.   Marland Kitchen EXTRACORPOREAL SHOCK WAVE LITHOTRIPSY Left 02/12/2020   Procedure: EXTRACORPOREAL SHOCK WAVE LITHOTRIPSY (ESWL);  Surgeon: Festus Aloe, MD;  Location: [facility];  Service: Urology;  Laterality: Left;  . FOOT SURGERY Bilateral 2001  . PARATHYROIDECTOMY N/A 01/03/2016   Procedure: PARATHYROIDECTOMY;  Surgeon: Leta Baptist, MD;  Location: [place];  Service: ENT;  Laterality: N/A;  . TUBAL LIGATION      Social History: Social History   Socioeconomic History  . Marital status: Married  Spouse name: Anaissa Macfadden  . Number of children: Not on file  . Years of education: Not on file  . Highest education level: Not on file  Occupational History  . Occupation: CMA    Employer: [employer]  Tobacco Use  . Smoking status: Never Smoker  . Smokeless tobacco: Never Used  Vaping Use  . Vaping Use: Never used  Substance and Sexual Activity  . Alcohol use: No  . Drug use: No  . Sexual activity: Yes    Partners: Male    Birth control/protection: Surgical    Comment: TL  Other Topics Concern  . Not on file  Social History Narrative  . Not on file   Social Determinants of Health   Financial Resource Strain:   .  Difficulty of Paying Living Expenses: Not on file  Food Insecurity:   . Worried About Charity fundraiser in the Last Year: Not on file  . Ran Out of Food in the Last Year: Not on file  Transportation Needs:   . Lack of Transportation (Medical): Not on file  . Lack of Transportation (Non-Medical): Not on file  Physical Activity:   . Days of Exercise per Week: Not on file  . Minutes of Exercise per Session: Not on file  Stress:   . Feeling of Stress : Not on file  Social Connections:   . Frequency of Communication with Friends and Family: Not on file  . Frequency of Social Gatherings with Friends and Family: Not on file  . Attends Religious Services: Not on file  . Active Member of Clubs or Organizations: Not on file  . Attends Archivist Meetings: Not on file  . Marital Status: Not on file  Intimate Partner Violence:   . Fear of Current or Ex-Partner: Not on file  . Emotionally Abused: Not on file  . Physically Abused: Not on file  . Sexually Abused: Not on file    Family History: Family History  Problem Relation Age of Onset  . Hypertension Father   . Cancer Maternal Grandmother   . Heart disease Paternal Grandmother   . Cancer Paternal Grandmother   . Diabetes Paternal Grandmother   . Melanoma Maternal Grandfather   . Colon cancer Neg Hx   . Colon polyps Neg Hx   . Esophageal cancer Neg Hx   . Rectal cancer Neg Hx   . Stomach cancer Neg Hx     Review of Systems: Review of Systems  Constitutional: Negative for chills, fever and malaise/fatigue.  Eyes: Negative for blurred vision and double vision.  Respiratory: Negative.   Cardiovascular: Negative.   Gastrointestinal: Negative for nausea and vomiting.    Physical Exam: Vital Signs BP 132/77 (BP Location: Left Arm, Patient Position: Sitting, Cuff Size: Large)   Pulse 82   Temp 98.5 F (36.9 C) (Oral)   Ht 5\' 6"  (1.676 m)   Wt 190 lb 3.2 oz (86.3 kg)   LMP 05/11/2017   SpO2 95%   BMI 30.70 kg/m    Physical Exam Constitutional:      General: She is not in acute distress.    Appearance: Normal appearance. She is not ill-appearing.  HENT:     Head: Normocephalic and atraumatic.  Eyes:     Pupils: Pupils are equal, round, excess tissue of bilateral upper eyelids with excess upper eyelid medial fat pads Neck:     Musculoskeletal: Normal range of motion.  Cardiovascular:     Rate and Rhythm: Normal  rate and regular rhythm.     Pulses: Normal pulses.     Heart sounds: Normal heart sounds. No murmur.  Pulmonary:     Effort: Pulmonary effort is normal. No respiratory distress.     Breath sounds: Normal breath sounds. No wheezing.  Abdominal:     General: Abdomen is flat. There is no distension.     Palpations: Abdomen is soft.     Tenderness: There is no abdominal tenderness.  Musculoskeletal: Normal range of motion.  Skin:    General: Skin is warm and dry.     Findings: No erythema or rash.  Neurological:     General: No focal deficit present.     Mental Status: She is alert and oriented to person, place, and time. Mental status is at baseline.     Motor: No weakness.  Psychiatric:        Mood and Affect: Mood normal.        Behavior: Behavior normal. '    Assessment/Plan: Patient is scheduled for bilateral upper eyelid blepharoplasty with Dr. Marla Roe.  Risks, benefits, and alternatives of procedure discussed, questions answered and consent obtained.    Smoking Status: Non-smoker; Counseling Given?  N/A  Caprini Score: 4, moderate; Risk Factors include: Age, BMI greater than 25, and length of planned surgery. Recommendation for mechanical prophylaxis during surgery. Encourage early ambulation.   Pictures obtained:[email or web address]   Post-op Rx sent to pharmacy: Norco, Zofran, Keflex  Patient was provided with the General Surgical Risk consent document and Pain Medication Agreement prior to their appointment.  They had adequate time to read through the risk consent documents  and Pain Medication Agreement. We also discussed them in person together during this preop appointment. All of their questions were answered to their satisfaction.  Recommended calling if they have any further questions.  Risk consent form and Pain Medication Agreement to be scanned into patient's chart.  We specifically discussed the risks associated with bilateral upper lid blepharoplasty including but not limited to retrobulbar hematoma, loss of vision, damage to surrounding structures, bleeding, infection.    Electronically signed by: Carola Rhine Marisol Giambra, PA-C 02/28/2020 1:21 PM

## 2020-02-28 NOTE — H&P (View-Only) (Signed)
Patient ID: [name], female    DOB: 1971/06/29, 49 y.o.   MRN: [id number]  Chief Complaint  Patient presents with   Pre-op Exam      ICD-10-CM   1. Dermatochalasis of both upper eyelids  H02.831    H02.834      History of Present Illness: [name] is a 49 y.o.  female  with a history of dermatochalasis causing visual changes. She presents for preoperative evaluation for upcoming procedure, bilateral upper lid blepharoplasty, scheduled for 03/13/2020 with Dr. Marla Roe  Patient reports previous history of postoperative nausea vomiting, otherwise does well with anesthesia.  She reports she has had the scopolamine patch in the past which has been very helpful. No history of DVT/PE.  No family history of DVT/PE.  No family or personal history of bleeding or clotting disorders.  Patient is not currently taking any blood thinners.  No history of CVA/MI.   PMH Significant for: Anemia, GERD, hypertension (well controlled per patient), migraines.    Past Medical History: Allergies: No Known Allergies  Current Medications:  Current Outpatient Medications:    albuterol (PROAIR HFA) 108 (90 Base) MCG/ACT inhaler, Inhale 1 puff into the lungs as needed., Disp: , Rfl:    diltiazem (CARDIZEM CD) 120 MG 24 hr capsule, Take 1 capsule (120 mg total) by mouth daily., Disp: 90 capsule, Rfl: 1   Multiple Vitamin (MULTIVITAMIN) tablet, Take 1 tablet by mouth as needed. , Disp: , Rfl:    SUMAtriptan (IMITREX) 50 MG tablet, Take 50 mg by mouth every 2 (two) hours as needed for migraine. May repeat in 2 hours if headache persists or recurs., Disp: , Rfl:    Vitamin D, Ergocalciferol, (DRISDOL) 1.25 MG (50000 UT) CAPS capsule, Take 1 capsule (50,000 Units total) by mouth every 7 (seven) days., Disp: 4 capsule, Rfl: 0   cephALEXin (KEFLEX) 500 MG capsule, Take 1 capsule (500 mg total) by mouth 4 (four) times daily for 3 days., Disp: 12 capsule, Rfl: 0   HYDROcodone-acetaminophen (NORCO) 5-325  MG tablet, Take 1 tablet by mouth every 6 (six) hours as needed for up to 5 days for severe pain., Disp: 20 tablet, Rfl: 0   ondansetron (ZOFRAN) 4 MG tablet, Take 1 tablet (4 mg total) by mouth every 8 (eight) hours as needed for nausea or vomiting., Disp: 20 tablet, Rfl: 0  Past Medical Problems: Past Medical History:  Diagnosis Date   Anemia    Asthma    as child   Back pain    GERD (gastroesophageal reflux disease)    Headache    Hypertension    Joint pain    Migraines    Parathyroid abnormality ([facility])    Swelling    feet and legs   Vitamin D deficiency     Past Surgical History: Past Surgical History:  Procedure Laterality Date   ABDOMINAL HYSTERECTOMY     ESOPHAGOGASTRODUODENOSCOPY  09/13/2003   Normal EGD.    EXTRACORPOREAL SHOCK WAVE LITHOTRIPSY Left 02/12/2020   Procedure: EXTRACORPOREAL SHOCK WAVE LITHOTRIPSY (ESWL);  Surgeon: Festus Aloe, MD;  Location[facility];  Service: Urology;  Laterality: Left;   FOOT SURGERY Bilateral 2001   PARATHYROIDECTOMY N/A 01/03/2016   Procedure: PARATHYROIDECTOMY;  Surgeon: Leta Baptist, MD;  Location: [place];  Service: ENT;  Laterality: N/A;   TUBAL LIGATION      Social History: Social History   Socioeconomic History   Marital status: Married  Spouse name: Edd Arbour Quinn   Number of children: Not on file   Years of education: Not on file   Highest education level: Not on file  Occupational History   Occupation: CMA    Employer: [employer]  Tobacco Use   Smoking status: Never Smoker   Smokeless tobacco: Never Used  Scientific laboratory technician Use: Never used  Substance and Sexual Activity   Alcohol use: No   Drug use: No   Sexual activity: Yes    Partners: Male    Birth control/protection: Surgical    Comment: TL  Other Topics Concern   Not on file  Social History Narrative   Not on file   Social Determinants of Health   Financial Resource Strain:     Difficulty of Paying Living Expenses: Not on file  Food Insecurity:    Worried About Charity fundraiser in the Last Year: Not on file   [employer] of Food in the Last Year: Not on file  Transportation Needs:    Lack of Transportation (Medical): Not on file   Lack of Transportation (Non-Medical): Not on file  Physical Activity:    Days of Exercise per Week: Not on file   Minutes of Exercise per Session: Not on file  Stress:    Feeling of Stress : Not on file  Social Connections:    Frequency of Communication with Friends and Family: Not on file   Frequency of Social Gatherings with Friends and Family: Not on file   Attends Religious Services: Not on file   Active Member of Clubs or Organizations: Not on file   Attends Archivist Meetings: Not on file   Marital Status: Not on file  Intimate Partner Violence:    Fear of Current or Ex-Partner: Not on file   Emotionally Abused: Not on file   Physically Abused: Not on file   Sexually Abused: Not on file    Family History: Family History  Problem Relation Age of Onset   Hypertension Father    Cancer Maternal Grandmother    Heart disease Paternal Grandmother    Cancer Paternal Grandmother    Diabetes Paternal Grandmother    Melanoma Maternal Grandfather    Colon cancer Neg Hx    Colon polyps Neg Hx    Esophageal cancer Neg Hx    Rectal cancer Neg Hx    Stomach cancer Neg Hx     Review of Systems: Review of Systems  Constitutional: Negative for chills, fever and malaise/fatigue.  Eyes: Negative for blurred vision and double vision.  Respiratory: Negative.   Cardiovascular: Negative.   Gastrointestinal: Negative for nausea and vomiting.    Physical Exam: Vital Signs BP 132/77 (BP Location: Left Arm, Patient Position: Sitting, Cuff Size: Large)    Pulse 82    Temp 98.5 F (36.9 C) (Oral)    Ht 5\' 6"  (1.676 m)    Wt 190 lb 3.2 oz (86.3 kg)    LMP 05/11/2017    SpO2 95%    BMI 30.70 kg/m    Physical Exam Constitutional:      General: She is not in acute distress.    Appearance: Normal appearance. She is not ill-appearing.  HENT:     Head: Normocephalic and atraumatic.  Eyes:     Pupils: Pupils are equal, round, excess tissue of bilateral upper eyelids with excess upper eyelid medial fat pads Neck:     Musculoskeletal: Normal range of motion.  Cardiovascular:  Rate and Rhythm: Normal rate and regular rhythm.     Pulses: Normal pulses.     Heart sounds: Normal heart sounds. No murmur.  Pulmonary:     Effort: Pulmonary effort is normal. No respiratory distress.     Breath sounds: Normal breath sounds. No wheezing.  Abdominal:     General: Abdomen is flat. There is no distension.     Palpations: Abdomen is soft.     Tenderness: There is no abdominal tenderness.  Musculoskeletal: Normal range of motion.  Skin:    General: Skin is warm and dry.     Findings: No erythema or rash.  Neurological:     General: No focal deficit present.     Mental Status: She is alert and oriented to person, place, and time. Mental status is at baseline.     Motor: No weakness.  Psychiatric:        Mood and Affect: Mood normal.        Behavior: Behavior normal. '    Assessment/Plan: Patient is scheduled for bilateral upper eyelid blepharoplasty with Dr. Marla Roe.  Risks, benefits, and alternatives of procedure discussed, questions answered and consent obtained.    Smoking Status: Non-smoker; Counseling Given?  N/A  Caprini Score: 4, moderate; Risk Factors include: Age, BMI greater than 25, and length of planned surgery. Recommendation for mechanical prophylaxis during surgery. Encourage early ambulation.   Pictures obtained:[email or web address]   Post-op Rx sent to pharmacy: Norco, Zofran, Keflex  Patient was provided with the General Surgical Risk consent document and Pain Medication Agreement prior to their appointment.  They had adequate time to read through the risk consent documents  and Pain Medication Agreement. We also discussed them in person together during this preop appointment. All of their questions were answered to their satisfaction.  Recommended calling if they have any further questions.  Risk consent form and Pain Medication Agreement to be scanned into patient's chart.  We specifically discussed the risks associated with bilateral upper lid blepharoplasty including but not limited to retrobulbar hematoma, loss of vision, damage to surrounding structures, bleeding, infection.    Electronically signed by: Carola Rhine Yaa Donnellan, PA-C 02/28/2020 1:21 PM

## 2020-03-01 DIAGNOSIS — R1032 Left lower quadrant pain: Secondary | ICD-10-CM | POA: Diagnosis not present

## 2020-03-01 DIAGNOSIS — N200 Calculus of kidney: Secondary | ICD-10-CM | POA: Diagnosis not present

## 2020-03-01 MED FILL — KETOROLAC 10 MG TABLET: 10 | 5 days supply | Qty: 15 | Fill #0

## 2020-03-01 MED FILL — TAMSULOSIN HCL 0.4 MG CAP: 0.4 | 10 days supply | Qty: 10 | Fill #0

## 2020-03-05 ENCOUNTER — Other Ambulatory Visit: Payer: Self-pay

## 2020-03-05 ENCOUNTER — Encounter (HOSPITAL_BASED_OUTPATIENT_CLINIC_OR_DEPARTMENT_OTHER): Payer: Self-pay | Admitting: Plastic Surgery

## 2020-03-12 ENCOUNTER — Other Ambulatory Visit (HOSPITAL_COMMUNITY)
Admission: RE | Admit: 2020-03-12 | Discharge: 2020-03-12 | Disposition: A | Discharge status: Home / Self Care | Payer: 59 | Source: Ambulatory Visit | Attending: Plastic Surgery | Admitting: Plastic Surgery

## 2020-03-12 ENCOUNTER — Other Ambulatory Visit: Payer: Self-pay | Admitting: Cardiology

## 2020-03-12 ENCOUNTER — Telehealth: Payer: Self-pay | Admitting: Cardiology

## 2020-03-12 DIAGNOSIS — N281 Cyst of kidney, acquired: Secondary | ICD-10-CM | POA: Diagnosis not present

## 2020-03-12 DIAGNOSIS — Z01812 Encounter for preprocedural laboratory examination: Secondary | ICD-10-CM | POA: Insufficient documentation

## 2020-03-12 DIAGNOSIS — N200 Calculus of kidney: Secondary | ICD-10-CM | POA: Diagnosis not present

## 2020-03-12 DIAGNOSIS — Z20822 Contact with and (suspected) exposure to covid-19: Secondary | ICD-10-CM | POA: Diagnosis not present

## 2020-03-12 LAB — SARS CORONAVIRUS 2 (TAT 6-24 HRS): SARS Coronavirus 2: NEGATIVE

## 2020-03-12 MED ORDER — DILTIAZEM HCL ER COATED BEADS 180 MG PO CP24: 180.0000 mg | ORAL_CAPSULE | Freq: Every day | ORAL | 1 refills | Status: DC

## 2020-03-12 NOTE — Telephone Encounter (Signed)
Pt c/o medication issue:  1. Name of Medication: diltiazem (CARDIZEM CD) 120 MG 24 hr capsule   2. How are you currently taking this medication (dosage and times per day)? 1 capsule by mouth every morning  3. Are you having a reaction (difficulty breathing--STAT)? No   4. What is your medication issue? [name] is calling stating she does not feel this medication is bring down her BP the way it should. She states her diastolic is ranging in the 90's. Her BP when she went to the Doctor today was 148/90 something. She also states she is having symptoms of headaches and occasional nausea with this as well, and is becoming concerned. Please advise.

## 2020-03-12 NOTE — Telephone Encounter (Signed)
Called patient. Informed her that Dr. Kandis Mannan would like to increase cardizem to 180 mg daily. She verbally understood. No further questions.

## 2020-03-12 NOTE — Telephone Encounter (Signed)
Increase cardiazem to 180 po qd

## 2020-03-13 ENCOUNTER — Encounter (HOSPITAL_BASED_OUTPATIENT_CLINIC_OR_DEPARTMENT_OTHER)
Admission: RE | Disposition: A | Discharge status: Home / Self Care | Payer: Self-pay | Source: Home / Self Care | Attending: Plastic Surgery

## 2020-03-13 ENCOUNTER — Ambulatory Visit (HOSPITAL_BASED_OUTPATIENT_CLINIC_OR_DEPARTMENT_OTHER)
Admission: RE | Admit: 2020-03-13 | Discharge: 2020-03-13 | Disposition: A | Discharge status: Home / Self Care | Payer: 59 | Attending: Plastic Surgery | Admitting: Plastic Surgery

## 2020-03-13 ENCOUNTER — Other Ambulatory Visit: Payer: Self-pay

## 2020-03-13 ENCOUNTER — Ambulatory Visit (HOSPITAL_BASED_OUTPATIENT_CLINIC_OR_DEPARTMENT_OTHER): Payer: 59 | Admitting: Anesthesiology

## 2020-03-13 ENCOUNTER — Encounter (HOSPITAL_BASED_OUTPATIENT_CLINIC_OR_DEPARTMENT_OTHER): Payer: Self-pay | Admitting: Plastic Surgery

## 2020-03-13 DIAGNOSIS — H0234 Blepharochalasis left upper eyelid: Secondary | ICD-10-CM | POA: Insufficient documentation

## 2020-03-13 DIAGNOSIS — H0231 Blepharochalasis right upper eyelid: Secondary | ICD-10-CM | POA: Diagnosis not present

## 2020-03-13 DIAGNOSIS — K219 Gastro-esophageal reflux disease without esophagitis: Secondary | ICD-10-CM | POA: Insufficient documentation

## 2020-03-13 DIAGNOSIS — U071 COVID-19: Secondary | ICD-10-CM | POA: Diagnosis not present

## 2020-03-13 DIAGNOSIS — Z6830 Body mass index (BMI) 30.0-30.9, adult: Secondary | ICD-10-CM | POA: Diagnosis not present

## 2020-03-13 DIAGNOSIS — Z79899 Other long term (current) drug therapy: Secondary | ICD-10-CM | POA: Insufficient documentation

## 2020-03-13 DIAGNOSIS — E559 Vitamin D deficiency, unspecified: Secondary | ICD-10-CM | POA: Diagnosis not present

## 2020-03-13 DIAGNOSIS — G43909 Migraine, unspecified, not intractable, without status migrainosus: Secondary | ICD-10-CM | POA: Diagnosis not present

## 2020-03-13 DIAGNOSIS — H02834 Dermatochalasis of left upper eyelid: Secondary | ICD-10-CM | POA: Diagnosis not present

## 2020-03-13 DIAGNOSIS — I10 Essential (primary) hypertension: Secondary | ICD-10-CM | POA: Insufficient documentation

## 2020-03-13 DIAGNOSIS — H02831 Dermatochalasis of right upper eyelid: Secondary | ICD-10-CM | POA: Diagnosis not present

## 2020-03-13 DIAGNOSIS — J45909 Unspecified asthma, uncomplicated: Secondary | ICD-10-CM | POA: Diagnosis not present

## 2020-03-13 HISTORY — DX: Other specified postprocedural states: R11.2

## 2020-03-13 HISTORY — DX: Other specified postprocedural states: Z98.890

## 2020-03-13 HISTORY — PX: BROW LIFT: SHX178

## 2020-03-13 HISTORY — DX: Other complications of anesthesia, initial encounter: T88.59XA

## 2020-03-13 SURGERY — BLEPHAROPLASTY
Anesthesia: General | Site: Eye | Laterality: Bilateral

## 2020-03-13 MED ORDER — OXYCODONE HCL 5 MG PO TABS: 5.0000 mg | ORAL_TABLET | Freq: Once | ORAL | Status: DC | PRN

## 2020-03-13 MED ORDER — SODIUM CHLORIDE 0.9% FLUSH: 3.0000 mL | Freq: Two times a day (BID) | INTRAVENOUS | Status: DC

## 2020-03-13 MED ORDER — TOBRAMYCIN-DEXAMETHASONE 0.3-0.1 % OP OINT
TOPICAL_OINTMENT | OPHTHALMIC | Status: AC
  Filled 2020-03-13: qty 3.5

## 2020-03-13 MED ORDER — BSS IO SOLN
INTRAOCULAR | Status: AC
  Filled 2020-03-13: qty 15

## 2020-03-13 MED ORDER — MIDAZOLAM HCL 5 MG/5ML IJ SOLN
INTRAMUSCULAR | Status: DC | PRN
  Administered 2020-03-13: 2 mg via INTRAVENOUS

## 2020-03-13 MED ORDER — DEXAMETHASONE SODIUM PHOSPHATE 10 MG/ML IJ SOLN
INTRAMUSCULAR | Status: DC | PRN
  Administered 2020-03-13: 5 mg via INTRAVENOUS

## 2020-03-13 MED ORDER — PROPOFOL 10 MG/ML IV BOLUS
INTRAVENOUS | Status: AC
  Filled 2020-03-13: qty 20

## 2020-03-13 MED ORDER — OXYCODONE HCL 5 MG PO TABS: 5.0000 mg | ORAL_TABLET | ORAL | Status: DC | PRN

## 2020-03-13 MED ORDER — ACETAMINOPHEN 325 MG PO TABS: 325.0000 mg | ORAL_TABLET | ORAL | Status: DC | PRN

## 2020-03-13 MED ORDER — ONDANSETRON HCL 4 MG/2ML IJ SOLN
INTRAMUSCULAR | Status: DC | PRN
  Administered 2020-03-13: 4 mg via INTRAVENOUS

## 2020-03-13 MED ORDER — ACETAMINOPHEN 325 MG PO TABS: 650.0000 mg | ORAL_TABLET | ORAL | Status: DC | PRN

## 2020-03-13 MED ORDER — PROPOFOL 10 MG/ML IV BOLUS
INTRAVENOUS | Status: DC | PRN
  Administered 2020-03-13: 200 mg via INTRAVENOUS

## 2020-03-13 MED ORDER — FENTANYL CITRATE (PF) 100 MCG/2ML IJ SOLN
INTRAMUSCULAR | Status: AC
  Filled 2020-03-13: qty 2

## 2020-03-13 MED ORDER — MEPERIDINE HCL 25 MG/ML IJ SOLN: 6.2500 mg | INTRAMUSCULAR | Status: DC | PRN

## 2020-03-13 MED ORDER — MIDAZOLAM HCL 2 MG/2ML IJ SOLN
INTRAMUSCULAR | Status: AC
  Filled 2020-03-13: qty 2

## 2020-03-13 MED ORDER — FENTANYL CITRATE (PF) 100 MCG/2ML IJ SOLN
INTRAMUSCULAR | Status: DC | PRN
Start: 2020-03-13 — End: 2020-03-13
  Administered 2020-03-13 (×2): 25 ug via INTRAVENOUS

## 2020-03-13 MED ORDER — ONDANSETRON HCL 4 MG/2ML IJ SOLN: 4.0000 mg | Freq: Once | INTRAMUSCULAR | Status: DC | PRN

## 2020-03-13 MED ORDER — PROPOFOL 500 MG/50ML IV EMUL
INTRAVENOUS | Status: DC | PRN
  Administered 2020-03-13: 100 ug/kg/min via INTRAVENOUS

## 2020-03-13 MED ORDER — CHLORHEXIDINE GLUCONATE CLOTH 2 % EX PADS: 6.0000 | MEDICATED_PAD | Freq: Once | CUTANEOUS | Status: DC

## 2020-03-13 MED ORDER — LACTATED RINGERS IV SOLN: INTRAVENOUS | Status: DC

## 2020-03-13 MED ORDER — MORPHINE SULFATE (PF) 4 MG/ML IV SOLN: 1.0000 mg | INTRAVENOUS | Status: DC | PRN

## 2020-03-13 MED ORDER — CEFAZOLIN SODIUM-DEXTROSE 2-4 GM/100ML-% IV SOLN
2.0000 g | INTRAVENOUS | Status: AC
  Administered 2020-03-13: 2 g via INTRAVENOUS

## 2020-03-13 MED ORDER — FENTANYL CITRATE (PF) 100 MCG/2ML IJ SOLN
25.0000 ug | INTRAMUSCULAR | Status: DC | PRN
  Administered 2020-03-13: 50 ug via INTRAVENOUS

## 2020-03-13 MED ORDER — CEFAZOLIN SODIUM-DEXTROSE 2-4 GM/100ML-% IV SOLN
INTRAVENOUS | Status: AC
  Filled 2020-03-13: qty 100

## 2020-03-13 MED ORDER — SCOPOLAMINE 1 MG/3DAYS TD PT72
1.0000 | MEDICATED_PATCH | TRANSDERMAL | Status: DC
  Administered 2020-03-13: 1.5 mg via TRANSDERMAL

## 2020-03-13 MED ORDER — OXYCODONE HCL 5 MG/5ML PO SOLN: 5.0000 mg | Freq: Once | ORAL | Status: DC | PRN

## 2020-03-13 MED ORDER — SODIUM CHLORIDE 0.9 % IV SOLN: 250.0000 mL | INTRAVENOUS | Status: DC | PRN

## 2020-03-13 MED ORDER — SODIUM CHLORIDE 0.9% FLUSH: 3.0000 mL | INTRAVENOUS | Status: DC | PRN

## 2020-03-13 MED ORDER — PHENYLEPHRINE 40 MCG/ML (10ML) SYRINGE FOR IV PUSH (FOR BLOOD PRESSURE SUPPORT)
PREFILLED_SYRINGE | INTRAVENOUS | Status: DC | PRN
  Administered 2020-03-13 (×2): 80 ug via INTRAVENOUS

## 2020-03-13 MED ORDER — ACETAMINOPHEN 325 MG RE SUPP: 650.0000 mg | RECTAL | Status: DC | PRN

## 2020-03-13 MED ORDER — BACITRACIN-POLYMYXIN B 500-10000 UNIT/GM OP OINT
TOPICAL_OINTMENT | OPHTHALMIC | Status: AC
  Filled 2020-03-13: qty 3.5

## 2020-03-13 MED ORDER — LIDOCAINE-EPINEPHRINE 1 %-1:100000 IJ SOLN
INTRAMUSCULAR | Status: DC | PRN
  Administered 2020-03-13: 4 mL

## 2020-03-13 MED ORDER — ACETAMINOPHEN 160 MG/5ML PO SOLN: 325.0000 mg | ORAL | Status: DC | PRN

## 2020-03-13 SURGICAL SUPPLY — 38 items
ADH SKN CLS APL DERMABOND .7 (GAUZE/BANDAGES/DRESSINGS)
APL SRG 3 HI ABS STRL LF PLS (MISCELLANEOUS)
APL SWBSTK 6 STRL LF DISP (MISCELLANEOUS)
APPLICATOR COTTON TIP 6 STRL (MISCELLANEOUS) IMPLANT
APPLICATOR COTTON TIP 6IN STRL (MISCELLANEOUS)
APPLICATOR DR MATTHEWS STRL (MISCELLANEOUS) IMPLANT
BLADE SURG 15 STRL LF DISP TIS (BLADE) ×2 IMPLANT
BLADE SURG 15 STRL SS (BLADE) ×4
BNDG EYE OVAL (GAUZE/BANDAGES/DRESSINGS) ×4 IMPLANT
CORD BIPOLAR FORCEPS 12FT (ELECTRODE) ×2 IMPLANT
COVER BACK TABLE 60X90IN (DRAPES) ×2 IMPLANT
COVER MAYO STAND STRL (DRAPES) ×2 IMPLANT
COVER WAND RF STERILE (DRAPES) IMPLANT
DECANTER SPIKE VIAL GLASS SM (MISCELLANEOUS) IMPLANT
DERMABOND ADVANCED (GAUZE/BANDAGES/DRESSINGS)
DERMABOND ADVANCED .7 DNX12 (GAUZE/BANDAGES/DRESSINGS) IMPLANT
DRAPE U-SHAPE 76X120 STRL (DRAPES) ×2 IMPLANT
ELECT NEEDLE BLADE 2-5/6 (NEEDLE) IMPLANT
ELECT REM PT RETURN 9FT ADLT (ELECTROSURGICAL) ×2
ELECTRODE REM PT RTRN 9FT ADLT (ELECTROSURGICAL) ×1 IMPLANT
GLOVE BIO SURGEON STRL SZ 6.5 (GLOVE) ×4 IMPLANT
GOWN STRL REUS W/ TWL LRG LVL3 (GOWN DISPOSABLE) ×2 IMPLANT
GOWN STRL REUS W/TWL LRG LVL3 (GOWN DISPOSABLE) ×4
NEEDLE HYPO 30GX1 BEV (NEEDLE) IMPLANT
NEEDLE PRECISIONGLIDE 27X1.5 (NEEDLE) IMPLANT
PACK BASIN DAY SURGERY FS (CUSTOM PROCEDURE TRAY) ×2 IMPLANT
PENCIL SMOKE EVACUATOR (MISCELLANEOUS) IMPLANT
SHEILD EYE MED CORNL SHD 22X21 (OPHTHALMIC RELATED) ×2
SHIELD EYE MED CORNL SHD 22X21 (OPHTHALMIC RELATED) ×1 IMPLANT
SLEEVE SCD COMPRESS KNEE MED (MISCELLANEOUS) IMPLANT
STRIP CLOSURE SKIN 1/2X4 (GAUZE/BANDAGES/DRESSINGS) ×2 IMPLANT
STRIP SUTURE WOUND CLOSURE 1/2 (MISCELLANEOUS) IMPLANT
SUT MNCRL 6-0 UNDY P1 1X18 (SUTURE) IMPLANT
SUT MONOCRYL 6-0 P1 1X18 (SUTURE)
SUT PROLENE 6 0 P 1 18 (SUTURE) IMPLANT
SYR CONTROL 10ML LL (SYRINGE) IMPLANT
TOWEL GREEN STERILE FF (TOWEL DISPOSABLE) ×4 IMPLANT
TRAY DSU PREP LF (CUSTOM PROCEDURE TRAY) ×2 IMPLANT

## 2020-03-13 NOTE — Transfer of Care (Signed)
Immediate Anesthesia Transfer of Care Note  Patient: [name]  Procedure(s) Performed: Bilteral upper lid blepharoplasty (Bilateral Eye)  Patient Location: PACU  Anesthesia Type:General  Level of Consciousness: drowsy  Airway & Oxygen Therapy: Patient Spontanous Breathing and Patient connected to nasal cannula oxygen  Post-op Assessment: Report given to RN and Post -op Vital signs reviewed and stable  Post vital signs: Reviewed and stable  Last Vitals:  Vitals Value Taken Time  BP 128/94 03/13/20 1033  Temp    Pulse 72 03/13/20 1034  Resp 19 03/13/20 1034  SpO2 100 % 03/13/20 1034  Vitals shown include unvalidated device data.  Last Pain:  Vitals:   03/13/20 0856  TempSrc: Oral  PainSc: 0-No pain         Complications: No complications documented.

## 2020-03-13 NOTE — Anesthesia Postprocedure Evaluation (Signed)
Anesthesia Post Note  Patient: [name]  Procedure(s) Performed: Bilteral upper lid blepharoplasty (Bilateral Eye)     Patient location during evaluation: PACU Anesthesia Type: General Level of consciousness: awake and alert Pain management: pain level controlled Vital Signs Assessment: post-procedure vital signs reviewed and stable Respiratory status: spontaneous breathing, nonlabored ventilation, respiratory function stable and patient connected to nasal cannula oxygen Cardiovascular status: blood pressure returned to baseline and stable Postop Assessment: no apparent nausea or vomiting Anesthetic complications: no   No complications documented.  Last Vitals:  Vitals:   03/13/20 1115 03/13/20 1136  BP: (!) 142/94 (!) 156/94  Pulse: 70 64  Resp: 18 16  Temp:  36.7 C  SpO2: 98% 98%    Last Pain:  Vitals:   03/13/20 1136  TempSrc:   PainSc: 1                  Kaydince Towles

## 2020-03-13 NOTE — Anesthesia Preprocedure Evaluation (Addendum)
Anesthesia Evaluation  Patient identified by MRN, date of birth, ID band Patient awake    Reviewed: Allergy & Precautions, NPO status , Patient's Chart, lab work & pertinent test results  History of Anesthesia Complications (+) PONV and history of anesthetic complications  Airway Mallampati: I  TM Distance: >3 FB Neck ROM: Full    Dental no notable dental hx. (+) Teeth Intact, Dental Advisory Given   Pulmonary asthma ,    Pulmonary exam normal breath sounds clear to auscultation       Cardiovascular hypertension, Pt. on medications Normal cardiovascular exam Rhythm:Regular Rate:Normal     Neuro/Psych  Headaches, PSYCHIATRIC DISORDERS Depression    GI/Hepatic GERD  Medicated and Controlled,  Endo/Other  Morbid obesityParathyroid adenoma  Renal/GU   negative genitourinary   Musculoskeletal negative musculoskeletal ROS (+)   Abdominal   Peds  Hematology  (+) Blood dyscrasia, anemia ,   Anesthesia Other Findings   Reproductive/Obstetrics negative OB ROS                            Anesthesia Physical  Anesthesia Plan  ASA: II  Anesthesia Plan: General   Post-op Pain Management:    Induction: Intravenous  PONV Risk Score and Plan: 4 or greater and Ondansetron, Dexamethasone, Treatment may vary due to age or medical condition, Scopolamine patch - Pre-op and TIVA  Airway Management Planned: Oral ETT and LMA  Additional Equipment:   Intra-op Plan:   Post-operative Plan: Extubation in OR  Informed Consent: I have reviewed the patients History and Physical, chart, labs and discussed the procedure including the risks, benefits and alternatives for the proposed anesthesia with the patient or authorized representative who has indicated his/her understanding and acceptance.     Dental advisory given  Plan Discussed with: Anesthesiologist, CRNA and Surgeon  Anesthesia Plan Comments:         Anesthesia Quick Evaluation

## 2020-03-13 NOTE — Discharge Instructions (Signed)
INSTRUCTIONS FOR AFTER SURGERY   You will likely have some questions about what to expect following your operation.  The following information will help you and your family understand what to expect when you are discharged from the hospital.  Following these guidelines will help ensure a smooth recovery and reduce risks of complications.  Postoperative instructions include information on: diet, wound care, medications and physical activity.  AFTER SURGERY Expect to go home after the procedure.    DIET This surgery does not require a specific diet.  However, I have to mention that the healthier you eat the better your body can start healing. It is important to increasing your protein intake.  This means limiting the foods with added sugar.  Focus on fruits and vegetables and some meat. It is very important to drink water after your surgery.  If your urine is bright yellow, then it is concentrated, and you need to drink more water.  As a general rule after surgery, you should have 8 ounces of water every hour while awake.  If you find you are persistently nauseated or unable to take in liquids let us know.  NO TOBACCO USE or EXPOSURE.  This will slow your healing process and increase the risk of a wound.  ACTIVITY No heavy lifting until cleared by the doctor.  It is OK to walk and climb stairs. Keep head of bed elevated.  Ice to eyes for next 24 hours with 15 min on and 15 off.   WORK Everyone returns to work at different times. As a rough guide, most people take at least 1 - 2 weeks off prior to returning to work. If you need documentation for your job, bring the forms to your postoperative follow up visit.  DRIVING Arrange for someone to bring you home from the hospital.  You may be able to drive a few days after surgery but not while taking any narcotics or valium.  BOWEL MOVEMENTS Constipation can occur after anesthesia and while taking pain medication.  It is important to stay ahead for your  comfort.  We recommend taking Milk of Magnesia (2 tablespoons; twice a day) while taking the pain pills.  MEDICATIONS and PAIN CONTROL At your preoperative visit for you history and physical you were given the following medications: 1. An antibiotic: Start this medication when you get home and take according to the instructions on the bottle. 2. Zofran 4 mg:  This is to treat nausea and vomiting.  You can take this every 6 hours as needed and only if needed. 3. Norco (hydrocodone/acetaminophen) 5/325 mg:  This is only to be used after you have taken the motrin or the tylenol. Every 8 hours as needed. Over the counter Medication to take: 4. Ibuprofen (Motrin) 400 mg:  Take this every 6 hours.  If you have additional pain then take 500 mg of the tylenol.  Only take the Norco after you have tried these two. 5. Miralax or stool softener of choice: Take this according to the bottle if you take the [place] Call your surgeon's office if any of the following occur: . Fever 101 degrees F or greater . Excessive bleeding or fluid from the incision site. . Pain that increases over time without aid from the medications . Redness, warmth, or pus draining from incision sites . Persistent nausea or inability to take in liquids . Severe misshapen area that underwent the operation.    Post Anesthesia Home Care Instructions  Activity: Get plenty of rest for the remainder of the day. A responsible individual must stay with you for 24 hours following the procedure.  For the next 24 hours, DO NOT: -Drive a car -Paediatric nurse -Drink alcoholic beverages -Take any medication unless instructed by your physician -Make any legal decisions or sign important papers.  Meals: Start with liquid foods such as gelatin or soup. Progress to regular foods as tolerated. Avoid greasy, spicy, heavy foods. If nausea and/or vomiting occur, drink only clear liquids until the nausea and/or vomiting subsides.  Call your physician if vomiting continues.  Special Instructions/Symptoms: Your throat may feel dry or sore from the anesthesia or the breathing tube placed in your throat during surgery. If this causes discomfort, gargle with warm salt water. The discomfort should disappear within 24 hours.  If you had a scopolamine patch placed behind your ear for the management of post- operative nausea and/or vomiting:  1. The medication in the patch is effective for 72 hours, after which it should be removed.  Wrap patch in a tissue and discard in the trash. Wash hands thoroughly with soap and water. 2. You may remove the patch earlier than 72 hours if you experience unpleasant side effects which may include dry mouth, dizziness or visual disturbances. 3. Avoid touching the patch. Wash your hands with soap and water after contact with the patch.

## 2020-03-13 NOTE — Anesthesia Procedure Notes (Signed)
Procedure Name: LMA Insertion Date/Time: 03/13/2020 9:20 AM Performed by: Imagene Riches, CRNA Pre-anesthesia Checklist: Patient identified, Emergency Drugs available, Suction available and Patient being monitored Patient Re-evaluated:Patient Re-evaluated prior to induction Oxygen Delivery Method: Circle System Utilized Preoxygenation: Pre-oxygenation with 100% oxygen Induction Type: IV induction Ventilation: Mask ventilation without difficulty LMA: LMA inserted LMA Size: 4.0 Number of attempts: 1 Airway Equipment and Method: Bite block Placement Confirmation: positive ETCO2 Tube secured with: Tape Dental Injury: Teeth and Oropharynx as per pre-operative assessment

## 2020-03-13 NOTE — Op Note (Signed)
Operative Note  Date of operation: 03/13/2020  Patient: [name], MRN: [id number], 49 y.o. female.  Date of birth:  03-19-1971  Location: [place]  Preoperative Diagnosis: Redundant Upper Eyelid skin  Postoperative Diagnosis: Same  Procedure: Bilateral Upper eyelid blepharoplasty  Surgeon: Theodoro Kos Eriq Hufford, DO  Assistant: Phoebe Sharps, PA  Condition: Stable  Complications: None   EBL: Minimal  Disposition: Recovery Room  Indications: To prevent complications and improve visual field function.  Improve quality of life.  The patient presented with concerns regarding heavy upper eyelids with a decrease visual field.  This made it difficult with activities and reading.  It was better with manual elevation of the upper eyelids or extending the neck.  The clinical exam and tests confirmed the need for bilateral upper eyelid blepharoplasty.  Surgical risks and benefits were discussed in detail and are in the chart.  Procedure in detail: Patient was seen on the morning of the procedure after anesthesia evaluation. The patient was marked in holding in the sitting and reclining position. All questions were answered.  The patient was then taken to the operating room.  Anesthesia was administered and a time out was called with all information confirmed to be correct.  The patient was prepped and draped. The markings were confirmed.  Local anesthetic consisting of 1% lidocaine with 1:100,000 units of epinephrine injected. This was injected into the upper lids. The local was given 8 minutes to take effect.  Attention was directed to the right upper lid.  The redundant upper eyelid was measured with 20 mm of upper eyelid skin to remain.  A # 15 blade was used to incise the upper eyelid skin.  A small strip of orbicularis muscle was cauterized at the crease.  Hemostasis was achieved with electrocautery.  The dissection was carried down past the orbicularis and expose  the fat.  A small amount of fat was cauterized in the medial compartment of the upper lid. The incisions were closed with 6-0 Monocryl as a running subcuticular and several interrupted sutures placed laterally.  Left upper lid.  The redundant upper eyelid was measured with 20 mm of upper eyelid skin to remain.  A # 15 blade was used to incise the upper eyelid skin.  A small strip of orbicularis muscle was cauterized at the crease.  Hemostasis was achieved with electrocautery.  The dissection was carried down past the orbicularis and expose the fat.  A small amount of fat was cauterized in the medial compartment of the upper lid. The incisions were closed with 6-0 Monocryl as a running subcuticular and several interrupted sutures placed laterally.  The incisions were washed off and steristrips applied.  Ice was applied and patient was allowed to wake up and was taken to the recovery room with no apparent complications. Family was notified at the end of the case via a message on the cell phone.   The advanced practice practitioner (APP) assisted throughout the case.  The APP was essential in retraction and counter traction when needed to make the case progress smoothly.  This retraction and assistance made it possible to see the tissue plans for the procedure.  The assistance was needed for blood control, tissue re-approximation and assisted with closure of the incision site.

## 2020-03-14 ENCOUNTER — Encounter (HOSPITAL_BASED_OUTPATIENT_CLINIC_OR_DEPARTMENT_OTHER): Payer: Self-pay | Admitting: Plastic Surgery

## 2020-03-19 ENCOUNTER — Encounter: Payer: Self-pay | Admitting: Plastic Surgery

## 2020-03-19 ENCOUNTER — Ambulatory Visit (INDEPENDENT_AMBULATORY_CARE_PROVIDER_SITE_OTHER): Payer: 59 | Admitting: Plastic Surgery

## 2020-03-19 ENCOUNTER — Other Ambulatory Visit: Payer: Self-pay

## 2020-03-19 DIAGNOSIS — H02833 Dermatochalasis of right eye, unspecified eyelid: Secondary | ICD-10-CM | POA: Insufficient documentation

## 2020-03-19 DIAGNOSIS — H02831 Dermatochalasis of right upper eyelid: Secondary | ICD-10-CM

## 2020-03-19 DIAGNOSIS — H02834 Dermatochalasis of left upper eyelid: Secondary | ICD-10-CM

## 2020-03-19 DIAGNOSIS — H02836 Dermatochalasis of left eye, unspecified eyelid: Secondary | ICD-10-CM

## 2020-03-19 HISTORY — DX: Dermatochalasis of left eye, unspecified eyelid: H02.836

## 2020-03-19 HISTORY — DX: Dermatochalasis of right eye, unspecified eyelid: H02.833

## 2020-03-19 NOTE — Progress Notes (Signed)
The patient is a 49 year old female here for follow-up after undergoing bilateral upper lid blepharoplasty.  She has some swelling as expected and bruising.  I was able to remove the sutures.  There is no sign of swelling.  Her pain has been well controlled.  She has been using ice and resting.  She is going to return to work and does not need to continue with the ice.  No heavy lifting and we will see her back in 2 weeks. We will get her postop pictures at that time.

## 2020-03-20 NOTE — Interval H&P Note (Signed)
History and Physical Interval Note:  03/20/2020 9:25 PM  [name]  has presented today for surgery, with the diagnosis of dermatochalasis.  The various methods of treatment have been discussed with the patient and family. After consideration of risks, benefits and other options for treatment, the patient has consented to  Procedure(s) with comments: Bilteral upper lid blepharoplasty (Bilateral) - 1.5 hours as a surgical intervention.  The patient's history has been reviewed, patient examined, no change in status, stable for surgery.  I have reviewed the patient's chart and labs.  Questions were answered to the patient's satisfaction.     Loel Lofty Charmine Bockrath

## 2020-03-22 ENCOUNTER — Ambulatory Visit (INDEPENDENT_AMBULATORY_CARE_PROVIDER_SITE_OTHER): Payer: 59 | Admitting: Plastic Surgery

## 2020-03-22 ENCOUNTER — Other Ambulatory Visit: Payer: Self-pay

## 2020-03-22 ENCOUNTER — Encounter: Payer: Self-pay | Admitting: Plastic Surgery

## 2020-03-22 ENCOUNTER — Telehealth: Payer: Self-pay

## 2020-03-22 VITALS — BP 132/89 | HR 87 | Temp 98.6°F

## 2020-03-22 DIAGNOSIS — H02834 Dermatochalasis of left upper eyelid: Secondary | ICD-10-CM

## 2020-03-22 DIAGNOSIS — H02831 Dermatochalasis of right upper eyelid: Secondary | ICD-10-CM

## 2020-03-22 NOTE — Telephone Encounter (Signed)
Patient had blepharoplasty on 03/13/2020. This morning, she accidently split open her incision with her thumb while fixing her hair. She has put a steri-strip on it and would like a call back to discuss treatment options.

## 2020-03-22 NOTE — Progress Notes (Signed)
The patient had a bilateral blepharoplasty several weeks ago.  She is accidentally scratched her left lateral upper eyelid this morning.  She was worried that she may have opened it.  She put some Steri-Strips on there.  I took the Steri-Strips off and in fact there was a little bit of separation of the skin.  I went ahead and put some Dermabond on it.  I think that will do fine and she will heal without any trouble.

## 2020-03-22 NOTE — Telephone Encounter (Signed)
Called and LMOM @ 9:01am informing the patient that I spoke with [facility] regarding her message and she stated that she can come in today @ 9:20am and see Johanna,PA-C and she may have to wait a little bit so that Dr. Marla Roe can come in and see it as well, or she can come @ 1:45pm and see Dr. Marla Roe.  As the patient to give [name] a call back and let us know what time she can come in.//AB/CMA

## 2020-03-31 NOTE — Progress Notes (Deleted)
Patient is a 49 yr-old female here for follow-up after undergoing a bilateral upper eyelid blepharoplasty on 03/13/20 with Dr. Marla Roe. At last visit on 9/17, patient had scratched her left lateral upper eyelid causing a small bit of separation of the skin. Dermabond was placed.   ~ 3 weeks PO

## 2020-04-02 ENCOUNTER — Encounter: Payer: 59 | Admitting: Plastic Surgery

## 2020-04-03 ENCOUNTER — Encounter: Payer: 59 | Admitting: Plastic Surgery

## 2020-04-08 NOTE — Progress Notes (Signed)
Rescheduled visit.

## 2020-04-11 ENCOUNTER — Encounter: Payer: Self-pay | Admitting: Plastic Surgery

## 2020-04-11 ENCOUNTER — Other Ambulatory Visit: Payer: Self-pay

## 2020-04-11 ENCOUNTER — Ambulatory Visit: Payer: 59 | Admitting: Plastic Surgery

## 2020-04-11 VITALS — BP 124/90 | HR 73 | Temp 98.2°F

## 2020-04-11 DIAGNOSIS — N83202 Unspecified ovarian cyst, left side: Secondary | ICD-10-CM

## 2020-04-11 DIAGNOSIS — N838 Other noninflammatory disorders of ovary, fallopian tube and broad ligament: Secondary | ICD-10-CM | POA: Diagnosis not present

## 2020-04-11 DIAGNOSIS — Z9889 Other specified postprocedural states: Secondary | ICD-10-CM

## 2020-04-11 HISTORY — DX: Unspecified ovarian cyst, left side: N83.202

## 2020-04-12 ENCOUNTER — Encounter: Payer: Self-pay | Admitting: Plastic Surgery

## 2020-04-23 MED FILL — DILTIAZEM HCL ER COATED BEA: 180 | 90 days supply | Qty: 90 | Fill #0

## 2020-04-27 DIAGNOSIS — R109 Unspecified abdominal pain: Secondary | ICD-10-CM | POA: Diagnosis not present

## 2020-04-27 DIAGNOSIS — Z87442 Personal history of urinary calculi: Secondary | ICD-10-CM | POA: Diagnosis not present

## 2020-04-29 DIAGNOSIS — Z90710 Acquired absence of both cervix and uterus: Secondary | ICD-10-CM | POA: Diagnosis not present

## 2020-04-29 DIAGNOSIS — R109 Unspecified abdominal pain: Secondary | ICD-10-CM | POA: Diagnosis not present

## 2020-05-04 DIAGNOSIS — N3001 Acute cystitis with hematuria: Secondary | ICD-10-CM | POA: Diagnosis not present

## 2020-05-04 DIAGNOSIS — B373 Candidiasis of vulva and vagina: Secondary | ICD-10-CM | POA: Diagnosis not present

## 2020-05-08 ENCOUNTER — Ambulatory Visit: Payer: 59 | Admitting: Cardiology

## 2020-05-12 DIAGNOSIS — N281 Cyst of kidney, acquired: Secondary | ICD-10-CM | POA: Diagnosis not present

## 2020-05-12 DIAGNOSIS — R310 Gross hematuria: Secondary | ICD-10-CM | POA: Diagnosis not present

## 2020-05-12 DIAGNOSIS — Z90710 Acquired absence of both cervix and uterus: Secondary | ICD-10-CM | POA: Diagnosis not present

## 2020-05-12 DIAGNOSIS — R319 Hematuria, unspecified: Secondary | ICD-10-CM | POA: Diagnosis not present

## 2020-05-12 DIAGNOSIS — R110 Nausea: Secondary | ICD-10-CM | POA: Diagnosis not present

## 2020-05-12 DIAGNOSIS — R109 Unspecified abdominal pain: Secondary | ICD-10-CM | POA: Diagnosis not present

## 2020-05-29 DIAGNOSIS — Z124 Encounter for screening for malignant neoplasm of cervix: Secondary | ICD-10-CM | POA: Diagnosis not present

## 2020-05-29 DIAGNOSIS — N898 Other specified noninflammatory disorders of vagina: Secondary | ICD-10-CM | POA: Diagnosis not present

## 2020-05-29 DIAGNOSIS — Z01419 Encounter for gynecological examination (general) (routine) without abnormal findings: Secondary | ICD-10-CM | POA: Diagnosis not present

## 2020-06-04 ENCOUNTER — Ambulatory Visit: Payer: 59 | Admitting: Plastic Surgery

## 2020-06-06 DIAGNOSIS — R1032 Left lower quadrant pain: Secondary | ICD-10-CM | POA: Diagnosis not present

## 2020-06-06 MED FILL — MELOXICAM 15 MG TABLET: 15 | 14 days supply | Qty: 14 | Fill #0

## 2020-06-07 ENCOUNTER — Ambulatory Visit: Payer: 59 | Admitting: Cardiology

## 2020-06-07 DIAGNOSIS — N898 Other specified noninflammatory disorders of vagina: Secondary | ICD-10-CM | POA: Diagnosis not present

## 2020-07-05 ENCOUNTER — Ambulatory Visit: Payer: 59 | Admitting: Plastic Surgery

## 2020-07-09 ENCOUNTER — Ambulatory Visit: Payer: 59 | Admitting: Plastic Surgery

## 2020-07-10 ENCOUNTER — Other Ambulatory Visit: Payer: Self-pay

## 2020-07-10 DIAGNOSIS — R609 Edema, unspecified: Secondary | ICD-10-CM | POA: Insufficient documentation

## 2020-07-10 DIAGNOSIS — I10 Essential (primary) hypertension: Secondary | ICD-10-CM | POA: Insufficient documentation

## 2020-07-10 DIAGNOSIS — D649 Anemia, unspecified: Secondary | ICD-10-CM | POA: Insufficient documentation

## 2020-07-10 DIAGNOSIS — T8859XA Other complications of anesthesia, initial encounter: Secondary | ICD-10-CM | POA: Insufficient documentation

## 2020-07-10 DIAGNOSIS — J45909 Unspecified asthma, uncomplicated: Secondary | ICD-10-CM | POA: Insufficient documentation

## 2020-07-10 DIAGNOSIS — R112 Nausea with vomiting, unspecified: Secondary | ICD-10-CM | POA: Insufficient documentation

## 2020-07-10 DIAGNOSIS — G43909 Migraine, unspecified, not intractable, without status migrainosus: Secondary | ICD-10-CM | POA: Insufficient documentation

## 2020-07-10 DIAGNOSIS — R519 Headache, unspecified: Secondary | ICD-10-CM | POA: Insufficient documentation

## 2020-07-10 DIAGNOSIS — E215 Disorder of parathyroid gland, unspecified: Secondary | ICD-10-CM | POA: Insufficient documentation

## 2020-07-10 DIAGNOSIS — M2550 Pain in unspecified joint: Secondary | ICD-10-CM | POA: Insufficient documentation

## 2020-07-10 DIAGNOSIS — Z9889 Other specified postprocedural states: Secondary | ICD-10-CM | POA: Insufficient documentation

## 2020-07-10 DIAGNOSIS — K219 Gastro-esophageal reflux disease without esophagitis: Secondary | ICD-10-CM | POA: Insufficient documentation

## 2020-07-10 DIAGNOSIS — M549 Dorsalgia, unspecified: Secondary | ICD-10-CM | POA: Insufficient documentation

## 2020-07-11 ENCOUNTER — Ambulatory Visit: Payer: 59 | Admitting: Cardiology

## 2020-07-13 ENCOUNTER — Other Ambulatory Visit (HOSPITAL_COMMUNITY): Payer: Self-pay | Admitting: Family Medicine

## 2020-07-13 DIAGNOSIS — R222 Localized swelling, mass and lump, trunk: Secondary | ICD-10-CM | POA: Diagnosis not present

## 2020-07-15 MED FILL — predniSONE 20 MG TABS: 20 | 5 days supply | Qty: 10 | Fill #0

## 2020-07-15 MED FILL — MELOXICAM 15 MG TABLET: 15 | 30 days supply | Qty: 30 | Fill #0

## 2020-07-16 ENCOUNTER — Other Ambulatory Visit (HOSPITAL_BASED_OUTPATIENT_CLINIC_OR_DEPARTMENT_OTHER): Payer: Self-pay | Admitting: Family Medicine

## 2020-07-16 DIAGNOSIS — R222 Localized swelling, mass and lump, trunk: Secondary | ICD-10-CM

## 2020-07-19 ENCOUNTER — Ambulatory Visit: Payer: 59 | Admitting: Cardiology

## 2020-07-19 ENCOUNTER — Other Ambulatory Visit (HOSPITAL_COMMUNITY): Payer: Self-pay | Admitting: Family Medicine

## 2020-07-19 ENCOUNTER — Encounter: Payer: Self-pay | Admitting: Cardiology

## 2020-07-19 ENCOUNTER — Ambulatory Visit (HOSPITAL_BASED_OUTPATIENT_CLINIC_OR_DEPARTMENT_OTHER)
Admission: RE | Admit: 2020-07-19 | Discharge: 2020-07-19 | Disposition: A | Discharge status: Home / Self Care | Payer: 59 | Source: Ambulatory Visit | Attending: Family Medicine | Admitting: Family Medicine

## 2020-07-19 ENCOUNTER — Other Ambulatory Visit: Payer: Self-pay

## 2020-07-19 VITALS — BP 142/96 | HR 75 | Ht 66.0 in | Wt 193.0 lb

## 2020-07-19 DIAGNOSIS — I471 Supraventricular tachycardia: Secondary | ICD-10-CM

## 2020-07-19 DIAGNOSIS — K219 Gastro-esophageal reflux disease without esophagitis: Secondary | ICD-10-CM

## 2020-07-19 DIAGNOSIS — R222 Localized swelling, mass and lump, trunk: Secondary | ICD-10-CM | POA: Diagnosis not present

## 2020-07-19 DIAGNOSIS — R0789 Other chest pain: Secondary | ICD-10-CM

## 2020-07-19 DIAGNOSIS — M25552 Pain in left hip: Secondary | ICD-10-CM | POA: Diagnosis not present

## 2020-07-19 DIAGNOSIS — I10 Essential (primary) hypertension: Secondary | ICD-10-CM

## 2020-07-19 MED FILL — FLUTICASONE PROP 50 MCG SPR: 50 | 90 days supply | Qty: 48 | Fill #0

## 2020-07-19 MED FILL — SUMAtriptan SUCCINATE 50 MG: 50 | 90 days supply | Qty: 27 | Fill #0

## 2020-07-19 NOTE — Patient Instructions (Signed)

## 2020-07-19 NOTE — Progress Notes (Signed)
Cardiology Office Note:    Date:  07/19/2020   ID:  [name], DOB 11-24-1970, MRN [id number]  PCP:  Myrlene Broker, MD  Cardiologist:  Jenne Campus, MD    Referring MD: Myrlene Broker, MD   Chief Complaint  Patient presents with  . Follow-up  I am doing well  History of Present Illness:    [name] is a 50 y.o. female with past medical history significant for supraventricular tachycardia, also nonsustained ventricular tachycardia, preserved ejection fraction.  Comes today 2 months of follow-up.  Overall doing well.  Denies have any palpitation dizziness no headaches.  Overall she is happy the way she feels.  Past Medical History:  Diagnosis Date  . Anemia   . Asthma    as child  . Atypical chest pain 04/13/2017  . Back pain   . Cellulitis of umbilicus [id number]  . Chronic pain of both knees 10/18/2015  . Class 1 obesity with serious comorbidity and body mass index (BMI) of 30.0 to 30.9 in adult 08/31/2016   Starting BMI greater then 30  . Complication of anesthesia   . Cyst of left ovary 04/11/2020  . Cystocele with incomplete uterovaginal prolapse 06/21/2018  . Depression 02/24/2017  . Dermatochalasis of both eyelids 03/19/2020  . Elevated serum creatinine 05/26/2017  . Encounter for annual physical examination excluding gynecological examination in a patient older than 17 years 02/20/2019  . Encounter for counseling 12/01/2019  . Encounter for laboratory testing for COVID-19 virus 02/20/2019  . Essential hypertension 02/24/2017  . Fibroids, intramural 04/05/2017  . Gastroesophageal reflux disease without esophagitis 04/05/2017  . GERD (gastroesophageal reflux disease)   . Headache   . History of migraine headaches 04/05/2017  . Hyperlipidemia 02/24/2017  . Hypertension   . 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 Joint pain   . Major depressive disorder, recurrent episode ([place]) 04/05/2017  . Migraine without status migrainosus, not intractable 05/26/2017  . Migraines   .  Mild intermittent asthma without complication [id number]  . Need for influenza vaccination 04/14/2018  . Other constipation 05/08/2019  . Other hyperlipidemia 09/01/2017  . Overweight with body mass index (BMI) 25.0-29.9 05/11/2018   Starting BMI greater then 30  . Palpitations 12/01/2019  . Parathyroid abnormality ([place])   . 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 Plantar fasciitis 11/16/2018  . PONV (postoperative nausea and vomiting)   . Rhinosinusitis 04/05/2017  . SVT (supraventricular tachycardia) ([place]) 01/19/2020  . Swelling    feet and legs  . Swelling of both lower extremities 12/01/2019  . Uterine prolapse 04/05/2017  . Vitamin D deficiency     Past Surgical History:  Procedure Laterality Date  . ABDOMINAL HYSTERECTOMY    . BROW LIFT Bilateral 03/13/2020   Procedure: Bilteral upper lid blepharoplasty;  Surgeon: Wallace Going, DO;  Location: [place];  Service: Plastics;  Laterality: Bilateral;  1.5 hours  . ESOPHAGOGASTRODUODENOSCOPY  09/13/2003   Normal EGD.   Marland Kitchen EXTRACORPOREAL SHOCK WAVE LITHOTRIPSY Left 02/12/2020   Procedure: EXTRACORPOREAL SHOCK WAVE LITHOTRIPSY (ESWL);  Surgeon: Festus Aloe, MD;  Location: [facility];  Service: Urology;  Laterality: Left;  . FOOT SURGERY Bilateral 2001  . PARATHYROIDECTOMY N/A 01/03/2016   Procedure: PARATHYROIDECTOMY;  Surgeon: Leta Baptist, MD;  Location: [place];  Service: ENT;  Laterality: N/A;  . TUBAL LIGATION      Current Medications: Current Meds  Medication Sig  . albuterol (VENTOLIN HFA) 108 (90 Base) MCG/ACT inhaler Inhale 1 puff  into the lungs as needed.  . diltiazem (CARDIZEM CD) 180 MG 24 hr capsule Take 1 capsule (180 mg total) by mouth daily.  . Multiple Vitamin (MULTIVITAMIN) tablet Take 1 tablet by mouth as needed.   . ondansetron (ZOFRAN) 4 MG tablet Take 1 tablet (4 mg total) by mouth every 8 (eight) hours as needed for nausea or vomiting.  . SUMAtriptan (IMITREX) 50 MG  tablet Take 50 mg by mouth every 2 (two) hours as needed for migraine. May repeat in 2 hours if headache persists or recurs.  . Vitamin D, Ergocalciferol, (DRISDOL) 1.25 MG (50000 UT) CAPS capsule Take 1 capsule (50,000 Units total) by mouth every 7 (seven) days.     Allergies:   Patient has no known allergies.   Social History   Socioeconomic History  . Marital status: Married    Spouse name: Ronnie Saraceno  . Number of children: Not on file  . Years of education: Not on file  . Highest education level: Not on file  Occupational History  . Occupation: CMA    Employer: [employer]  Tobacco Use  . Smoking status: Never Smoker  . Smokeless tobacco: Never Used  Vaping Use  . Vaping Use: Never used  Substance and Sexual Activity  . Alcohol use: No  . Drug use: No  . Sexual activity: Yes    Partners: Male    Birth control/protection: Surgical    Comment: TL  Other Topics Concern  . Not on file  Social History Narrative  . Not on file   Social Determinants of Health   Financial Resource Strain: Not on file  Food Insecurity: Not on file  Transportation Needs: Not on file  Physical Activity: Not on file  Stress: Not on file  Social Connections: Not on file     Family History: The patient's family history includes Cancer in her maternal grandmother and paternal grandmother; Diabetes in her paternal grandmother; Heart disease in her paternal grandmother; Hypertension in her father; Melanoma in her maternal grandfather. There is no history of Colon cancer, Colon polyps, Esophageal cancer, Rectal cancer, or Stomach cancer. ROS:   Please see the history of present illness.    All 14 point review of systems negative except as described per history of present illness  EKGs/Labs/Other Studies Reviewed:      Recent Labs: 01/12/2020: ALT 17; BUN 18; Creatinine, Ser 0.82; Hemoglobin 12.4; Platelets 234; Potassium 3.9; Sodium 138  Recent Lipid Panel    Component Value Date/Time    CHOL 205 (H) 02/06/2019 1010   TRIG 87 02/06/2019 1010   HDL 56 02/06/2019 1010   LDLCALC 132 (H) 02/06/2019 1010    Physical Exam:    VS:  BP (!) 142/96 (BP Location: Right Arm, Patient Position: Sitting)   Pulse 75   Ht 5\' 6"  (1.676 m)   Wt 193 lb (87.5 kg)   LMP 05/11/2017   SpO2 96%   BMI 31.15 kg/m     Wt Readings from Last 3 Encounters:  07/19/20 193 lb (87.5 kg)  03/13/20 191 lb 5.8 oz (86.8 kg)  02/28/20 190 lb 3.2 oz (86.3 kg)     GEN:  Well nourished, well developed in no acute distress HEENT: Normal NECK: No JVD; No carotid bruits LYMPHATICS: No lymphadenopathy CARDIAC: RRR, no murmurs, no rubs, no gallops RESPIRATORY:  Clear to auscultation without rales, wheezing or rhonchi  ABDOMEN: Soft, non-tender, non-distended MUSCULOSKELETAL:  No edema; No deformity  SKIN: Warm and dry LOWER EXTREMITIES: no  swelling NEUROLOGIC:  Alert and oriented x 3 PSYCHIATRIC:  Normal affect   ASSESSMENT:    1. SVT (supraventricular tachycardia) ([place])   2. Essential hypertension   3. Gastroesophageal reflux disease without esophagitis   4. Atypical chest pain    PLAN:    In order of problems listed above:  1. Supraventricular tachycardia successfully suppressed with calcium channel blocker which I will continue. 2. Essential hypertension blood pressure elevated today but she said always when she is in doctor her blood pressure is elevated when she is at home her blood pressure is normal. 3. Gastroesophageal reflux disease denies having a problem from that aspect. 4. Atypical chest pain denies having any. 5. Dyslipidemia I did review her K PN which show me LDL of 132 HDL 56, we did calculate her review to 10 years risk for coronary event which came as 1.6 only.  Therefore there is no need to treat that with medication just diet and good eating habits and exercise.   Medication Adjustments/Labs and Tests Ordered: Current medicines are reviewed at length with the patient  today.  Concerns regarding medicines are outlined above.  No orders of the defined types were placed in this encounter.  Medication changes: No orders of the defined types were placed in this encounter.   Signed, Park Liter, MD, [facility] 07/19/2020 4:29 PM    [place]

## 2020-07-26 DIAGNOSIS — R102 Pelvic and perineal pain: Secondary | ICD-10-CM | POA: Diagnosis not present

## 2020-07-26 DIAGNOSIS — I878 Other specified disorders of veins: Secondary | ICD-10-CM | POA: Diagnosis not present

## 2020-07-26 DIAGNOSIS — Z87442 Personal history of urinary calculi: Secondary | ICD-10-CM | POA: Diagnosis not present

## 2020-08-02 ENCOUNTER — Encounter: Payer: Self-pay | Admitting: Plastic Surgery

## 2020-08-02 ENCOUNTER — Ambulatory Visit: Payer: 59 | Admitting: Plastic Surgery

## 2020-08-02 ENCOUNTER — Other Ambulatory Visit: Payer: Self-pay

## 2020-08-02 VITALS — BP 156/104 | HR 84 | Ht 66.0 in | Wt 193.0 lb

## 2020-08-02 DIAGNOSIS — H02834 Dermatochalasis of left upper eyelid: Secondary | ICD-10-CM

## 2020-08-02 DIAGNOSIS — H02831 Dermatochalasis of right upper eyelid: Secondary | ICD-10-CM

## 2020-08-02 DIAGNOSIS — Z719 Counseling, unspecified: Secondary | ICD-10-CM

## 2020-08-02 MED FILL — ALBUTEROL SULFATE HFA 108 (: 108 (90 BAS | 50 days supply | Qty: 9 | Fill #0

## 2020-08-02 NOTE — Progress Notes (Signed)
   Subjective:    Patient ID: [name], female    DOB: 11-20-1970, 50 y.o.   MRN: [id number]  The patient is a 50 year old female here for follow-up on her upper lid blepharoplasty.  She is very pleased with her results.  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  She is 5 feet 6 inches tall and weighs 193 pounds.  She would like to move ahead with her abdominoplasty procedure for probably July.  She also asked for an evaluation of her submental fat and her chin.  Kybella may be a good option for the submental area.  For her face [name] would be a very nice option for her lashes.  She is also interested in a quote for a mastopexy.       Review of Systems  Constitutional: Negative.   Eyes: Negative.   Respiratory: Negative.   Cardiovascular: Negative.   Musculoskeletal: Negative.        Objective:   Physical Exam Vitals and nursing note reviewed.  Constitutional:      Appearance: Normal appearance.  HENT:     Head: Normocephalic and atraumatic.  Neurological:     General: No focal deficit present.     Mental Status: She is alert.  Psychiatric:        Mood and Affect: Mood normal.        Behavior: Behavior normal.        Thought Content: Thought content normal.       Assessment & Plan:     ICD-10-CM   1. Dermatochalasis of both upper eyelids  H02.831    H02.834     We can provide her with a quote for the [name] and a mastopexy.  She like to move ahead with scheduling the abdominoplasty.  Latisse was recommended for her lashes. Pictures were obtained of the patient and placed in the chart with the patient's or guardian's permission.

## 2020-08-12 ENCOUNTER — Other Ambulatory Visit (HOSPITAL_COMMUNITY): Payer: Self-pay | Admitting: Obstetrics and Gynecology

## 2020-08-12 DIAGNOSIS — R232 Flushing: Secondary | ICD-10-CM | POA: Diagnosis not present

## 2020-08-12 DIAGNOSIS — Z1272 Encounter for screening for malignant neoplasm of vagina: Secondary | ICD-10-CM | POA: Diagnosis not present

## 2020-08-12 DIAGNOSIS — N951 Menopausal and female climacteric states: Secondary | ICD-10-CM | POA: Diagnosis not present

## 2020-08-12 MED FILL — ESTRADIOL TDS 0.025 MG/DAY: 0.025 | 28 days supply | Qty: 4 | Fill #0

## 2020-08-13 MED FILL — DILTIAZEM HCL ER COATED BEA: 180 | 60 days supply | Qty: 60 | Fill #1

## 2020-08-21 IMAGING — NM NUCLEAR MEDICINE HEPATOBILIARY IMAGING WITH GALLBLADDER EF
2 series · 12 of 12 positions shown · non-contrast
Comparison: None.

CLINICAL DATA: Right upper quadrant pain with nausea

EXAM:
NUCLEAR MEDICINE HEPATOBILIARY IMAGING WITH GALLBLADDER EF
VIEWS:
Anterior right upper quadrant
RADIOPHARMACEUTICALS:  5.2 mCi Mc-NNm  Choletec IV

[he hepatobiliary · 4.52mm/px · 6 of 60 frames shown (1 of 2)]
[frame 6/60]
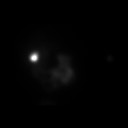
[frame 16/60]
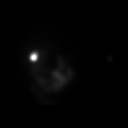
[frame 26/60]
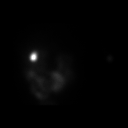
[frame 36/60]
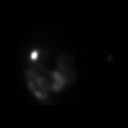
[frame 46/60]
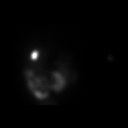
[frame 56/60]
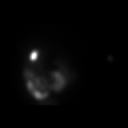

[he hepatobiliary · 4.52mm/px · 6 of 60 frames shown (2 of 2)]
[frame 6/60]
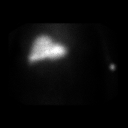
[frame 16/60]
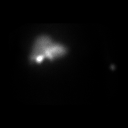
[frame 26/60]
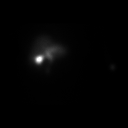
[frame 36/60]
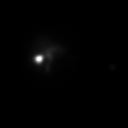
[frame 46/60]
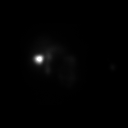
[frame 56/60]
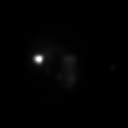

[12 of 12 positions shown; findings below may reference images not displayed]

FINDINGS: Liver uptake of radiotracer is unremarkable. There is prompt
visualization of gallbladder and small bowel, indicating patency of
the cystic and common bile ducts. The patient consumed 8 ounces of
Ensure orally with calculation of the computer generated ejection
fraction of radiotracer from the gallbladder. The patient did not
experience clinical symptoms with the oral Ensure consumption. The
computer generated ejection fraction of radiotracer from the
gallbladder is normal at 56%, normal greater than 33% using the oral
agent.
IMPRESSION: Study within normal limits.

## 2020-08-23 DIAGNOSIS — R1084 Generalized abdominal pain: Secondary | ICD-10-CM | POA: Diagnosis not present

## 2020-08-23 DIAGNOSIS — I10 Essential (primary) hypertension: Secondary | ICD-10-CM | POA: Diagnosis not present

## 2020-08-31 DIAGNOSIS — R0789 Other chest pain: Secondary | ICD-10-CM | POA: Diagnosis not present

## 2020-09-06 DIAGNOSIS — Z719 Counseling, unspecified: Secondary | ICD-10-CM

## 2020-09-06 MED FILL — ESTRADIOL TDS 0.025 MG/DAY: 0.025 | 28 days supply | Qty: 4 | Fill #1

## 2020-09-17 ENCOUNTER — Other Ambulatory Visit: Payer: Self-pay | Admitting: Obstetrics and Gynecology

## 2020-09-17 DIAGNOSIS — Z1231 Encounter for screening mammogram for malignant neoplasm of breast: Secondary | ICD-10-CM

## 2020-09-24 ENCOUNTER — Other Ambulatory Visit (HOSPITAL_BASED_OUTPATIENT_CLINIC_OR_DEPARTMENT_OTHER): Payer: Self-pay

## 2020-09-30 ENCOUNTER — Ambulatory Visit
Admission: RE | Admit: 2020-09-30 | Discharge: 2020-09-30 | Disposition: A | Discharge status: Home / Self Care | Payer: 59 | Source: Ambulatory Visit | Attending: Family Medicine | Admitting: Family Medicine

## 2020-09-30 ENCOUNTER — Other Ambulatory Visit: Payer: Self-pay | Admitting: Family Medicine

## 2020-09-30 DIAGNOSIS — R0789 Other chest pain: Secondary | ICD-10-CM | POA: Diagnosis not present

## 2020-10-01 MED FILL — ESTRADIOL TDS 0.025 MG/DAY: 0.025 | 28 days supply | Qty: 4 | Fill #2

## 2020-10-23 ENCOUNTER — Ambulatory Visit: Payer: 59 | Admitting: Gastroenterology

## 2020-10-26 ENCOUNTER — Ambulatory Visit (INDEPENDENT_AMBULATORY_CARE_PROVIDER_SITE_OTHER): Payer: 59

## 2020-10-26 ENCOUNTER — Other Ambulatory Visit: Payer: Self-pay

## 2020-10-26 ENCOUNTER — Ambulatory Visit (INDEPENDENT_AMBULATORY_CARE_PROVIDER_SITE_OTHER): Payer: 59 | Admitting: Podiatry

## 2020-10-26 DIAGNOSIS — M79671 Pain in right foot: Secondary | ICD-10-CM

## 2020-10-26 DIAGNOSIS — M778 Other enthesopathies, not elsewhere classified: Secondary | ICD-10-CM

## 2020-10-26 MED ORDER — TRIAMCINOLONE ACETONIDE 10 MG/ML IJ SUSP
10.0000 mg | Freq: Once | INTRAMUSCULAR | Status: AC
  Administered 2020-10-26: 10 mg

## 2020-10-28 ENCOUNTER — Other Ambulatory Visit: Payer: Self-pay | Admitting: Cardiology

## 2020-10-28 ENCOUNTER — Ambulatory Visit: Payer: 59

## 2020-10-28 ENCOUNTER — Other Ambulatory Visit (HOSPITAL_COMMUNITY): Payer: Self-pay

## 2020-10-28 MED ORDER — DILTIAZEM HCL ER COATED BEADS 180 MG PO CP24
ORAL_CAPSULE | Freq: Every day | ORAL | 1 refills | Status: DC
  Filled 2020-10-28: qty 90, 90d supply, fill #0
  Filled 2021-04-18: qty 90, 90d supply, fill #1

## 2020-10-28 NOTE — Telephone Encounter (Signed)
Rx refill sent to pharmacy. 

## 2020-10-29 ENCOUNTER — Other Ambulatory Visit (HOSPITAL_COMMUNITY): Payer: Self-pay

## 2020-10-29 MED FILL — Estradiol TD Patch Weekly 0.025 MG/24HR: TRANSDERMAL | 28 days supply | Qty: 4 | Fill #0 | Status: AC

## 2020-10-30 ENCOUNTER — Other Ambulatory Visit (HOSPITAL_COMMUNITY): Payer: Self-pay

## 2020-10-30 NOTE — Progress Notes (Signed)
Subjective: 50 year old female presents the office today for concerns of right foot pain.  She states has been ongoing for greater than a month but has been getting worse particular when she is on her feet all day at work.  She gets some swelling.  No injuries or falls.  She is asking for steroid injection today. Denies any systemic complaints such as fevers, chills, nausea, vomiting. No acute changes since last appointment, and no other complaints at this time.   Objective: AAO x3, NAD DP/PT pulses palpable bilaterally, CRT less than 3 seconds Tenderness along the right second MPJ and there is localized edema but no erythema or warmth there is no pain to vibratory sensation.  No area of pinpoint tenderness otherwise.  MMT 5/5. No pain with calf compression, swelling, warmth, erythema  Assessment: Right foot second MPJ capsulitis  Plan: -All treatment options discussed with the patient including all alternatives, risks, complications.  -X-rays obtained reviewed.  No evidence of acute fracture or stress fracture. -Steroid injection performed.  See procedure note below. -She has a cam boot at home therapy for her to wear.  As she starts to feel better she can transition to regular shoe with a graphite insert, stiffer soled shoe. -Over-the-counter anti-inflammatories sparingly given blood pressure issues. -Patient encouraged to call the office with any questions, concerns, change in symptoms.   Procedure: Injection small joint Discussed alternatives, risks, complications and verbal consent was obtained.  Location: Right second MPJ Skin Prep: Alcohol. Injectate: 0.5cc 0.5% marcaine plain, 0.5 cc 2% lidocaine plain and, 1 cc kenalog 10. Disposition: Patient tolerated procedure well. Injection site dressed with a band-aid.  Post-injection care was discussed and return precautions discussed.   No follow-ups on file.  Trula Slade DPM

## 2020-10-31 ENCOUNTER — Other Ambulatory Visit (HOSPITAL_COMMUNITY): Payer: Self-pay

## 2020-11-05 ENCOUNTER — Encounter: Payer: 59 | Admitting: Plastic Surgery

## 2020-11-22 ENCOUNTER — Ambulatory Visit (INDEPENDENT_AMBULATORY_CARE_PROVIDER_SITE_OTHER): Payer: Self-pay | Admitting: Plastic Surgery

## 2020-11-22 ENCOUNTER — Encounter: Payer: Self-pay | Admitting: Plastic Surgery

## 2020-11-22 ENCOUNTER — Other Ambulatory Visit: Payer: Self-pay

## 2020-11-22 ENCOUNTER — Ambulatory Visit (INDEPENDENT_AMBULATORY_CARE_PROVIDER_SITE_OTHER): Payer: 59 | Admitting: Gastroenterology

## 2020-11-22 ENCOUNTER — Encounter: Payer: Self-pay | Admitting: Gastroenterology

## 2020-11-22 VITALS — BP 112/68 | HR 85 | Ht 66.0 in | Wt 188.0 lb

## 2020-11-22 DIAGNOSIS — R1013 Epigastric pain: Secondary | ICD-10-CM | POA: Diagnosis not present

## 2020-11-22 DIAGNOSIS — R1012 Left upper quadrant pain: Secondary | ICD-10-CM

## 2020-11-22 DIAGNOSIS — Z719 Counseling, unspecified: Secondary | ICD-10-CM

## 2020-11-22 DIAGNOSIS — R52 Pain, unspecified: Secondary | ICD-10-CM
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
## 2020-11-22 NOTE — Patient Instructions (Addendum)
If you are age 51 or older, your body mass index should be between 23-30. Your Body mass index is 30.34 kg/m. If this is out of the aforementioned range listed, please consider follow up with your Primary Care Provider.  If you are age 71 or younger, your body mass index should be between 19-25. Your Body mass index is 30.34 kg/m. If this is out of the aformentioned range listed, please consider follow up with your Primary Care Provider.   Please use icy hot and heating pads at least 2 times a day for 4 weeks but call us in 2 week to let us know how you are doing. If still have problems we will do a CT abd first along with labs.   Thank you,  Dr. Jackquline Denmark

## 2020-11-22 NOTE — Progress Notes (Signed)
[place] Preoperative Dx: Hyperpigmentation  Postoperative Dx:  same  Procedure: laser to face  Anesthesia: none  Description of Procedure:  Risks and complications were explained to the patient. Consent was confirmed and signed. Time out was called and all information was confirmed to be correct. The area  area was prepped with alcohol and wiped dry. The BBL laser was set at 7J/cm2. The face was lasered. The patient tolerated the procedure well and there were no complications. The patient is to follow up in 4 weeks.

## 2020-11-25 ENCOUNTER — Telehealth: Payer: Self-pay

## 2020-11-25 NOTE — Telephone Encounter (Signed)
LVM for patient to call back.   CT abd scheduled for 12-27-2020 at 830am at the [facility]. Patient will need to come to pick up contrast and do lab work. She can come 4 days prior to her appointment to do lab work on the second floor and get the contrast and instructions from [place] on the first floor. She can cancel the appointment if she doesn't want it.

## 2020-11-26 ENCOUNTER — Other Ambulatory Visit (HOSPITAL_COMMUNITY): Payer: Self-pay

## 2020-11-26 MED FILL — Estradiol TD Patch Weekly 0.025 MG/24HR: TRANSDERMAL | 28 days supply | Qty: 4 | Fill #1 | Status: AC

## 2020-11-26 NOTE — Telephone Encounter (Signed)
Noted  

## 2020-11-26 NOTE — Telephone Encounter (Signed)
Patient returned call. Informed her about upcoming appointment, as well as lab work and picking up contrast. Patient expressed understanding.

## 2020-11-27 ENCOUNTER — Other Ambulatory Visit (HOSPITAL_COMMUNITY): Payer: Self-pay

## 2020-12-09 ENCOUNTER — Telehealth: Payer: Self-pay | Admitting: Plastic Surgery

## 2020-12-10 DIAGNOSIS — Z719 Counseling, unspecified: Secondary | ICD-10-CM

## 2020-12-26 ENCOUNTER — Telehealth: Payer: Self-pay

## 2020-12-26 NOTE — Telephone Encounter (Signed)
LVM.   CT had to be cancelled but patient have the option of going to [facility] if she doesn't want to do any labs.

## 2020-12-26 NOTE — Telephone Encounter (Signed)
Patient stated she is having lab work on the 29th for her physical so I told her she will need a CMP drawn and her Dr is with [facility] so could possible see the labs that day and she rescheduled for 7-6

## 2020-12-27 ENCOUNTER — Ambulatory Visit (HOSPITAL_BASED_OUTPATIENT_CLINIC_OR_DEPARTMENT_OTHER): Admission: RE | Admit: 2020-12-27 | Payer: 59 | Source: Ambulatory Visit

## 2020-12-29 MED FILL — Estradiol TD Patch Weekly 0.025 MG/24HR: TRANSDERMAL | 28 days supply | Qty: 4 | Fill #2 | Status: AC

## 2020-12-30 ENCOUNTER — Other Ambulatory Visit (HOSPITAL_COMMUNITY): Payer: Self-pay

## 2021-01-01 ENCOUNTER — Other Ambulatory Visit (HOSPITAL_COMMUNITY): Payer: Self-pay

## 2021-01-01 DIAGNOSIS — J310 Chronic rhinitis: Secondary | ICD-10-CM | POA: Diagnosis not present

## 2021-01-01 DIAGNOSIS — Z1329 Encounter for screening for other suspected endocrine disorder: Secondary | ICD-10-CM | POA: Diagnosis not present

## 2021-01-01 DIAGNOSIS — Z13220 Encounter for screening for lipoid disorders: Secondary | ICD-10-CM | POA: Diagnosis not present

## 2021-01-01 DIAGNOSIS — R7309 Other abnormal glucose: Secondary | ICD-10-CM | POA: Diagnosis not present

## 2021-01-01 DIAGNOSIS — Z131 Encounter for screening for diabetes mellitus: Secondary | ICD-10-CM | POA: Diagnosis not present

## 2021-01-01 DIAGNOSIS — I10 Essential (primary) hypertension: Secondary | ICD-10-CM | POA: Diagnosis not present

## 2021-01-01 DIAGNOSIS — Z8249 Family history of ischemic heart disease and other diseases of the circulatory system: Secondary | ICD-10-CM | POA: Diagnosis not present

## 2021-01-01 DIAGNOSIS — J329 Chronic sinusitis, unspecified: Secondary | ICD-10-CM | POA: Diagnosis not present

## 2021-01-01 DIAGNOSIS — Z0000 Encounter for general adult medical examination without abnormal findings: Secondary | ICD-10-CM | POA: Diagnosis not present

## 2021-01-01 MED ORDER — PROAIR HFA 108 (90 BASE) MCG/ACT IN AERS
1.0000 | INHALATION_SPRAY | Freq: Four times a day (QID) | RESPIRATORY_TRACT | 3 refills | Status: DC | PRN
  Filled 2021-01-01: qty 17, 90d supply, fill #0

## 2021-01-01 MED ORDER — FLUTICASONE PROPIONATE 50 MCG/ACT NA SUSP
1.0000 | Freq: Two times a day (BID) | NASAL | 3 refills | Status: DC
  Filled 2021-01-01: qty 48, 90d supply, fill #0
  Filled 2021-11-09: qty 48, 90d supply, fill #1

## 2021-01-03 NOTE — Telephone Encounter (Signed)
Labs from [facility] from 6-29 with PCP BUN 17 and Creatinine 0.89

## 2021-01-08 ENCOUNTER — Ambulatory Visit (HOSPITAL_BASED_OUTPATIENT_CLINIC_OR_DEPARTMENT_OTHER)
Admission: RE | Admit: 2021-01-08 | Discharge: 2021-01-08 | Disposition: A | Discharge status: Home / Self Care | Payer: 59 | Source: Ambulatory Visit | Attending: Gastroenterology | Admitting: Gastroenterology

## 2021-01-08 ENCOUNTER — Other Ambulatory Visit: Payer: Self-pay

## 2021-01-08 ENCOUNTER — Encounter (HOSPITAL_BASED_OUTPATIENT_CLINIC_OR_DEPARTMENT_OTHER): Payer: Self-pay

## 2021-01-08 ENCOUNTER — Ambulatory Visit
Admission: RE | Admit: 2021-01-08 | Discharge: 2021-01-08 | Disposition: A | Discharge status: Home / Self Care | Payer: 59 | Source: Ambulatory Visit | Attending: Obstetrics and Gynecology | Admitting: Obstetrics and Gynecology

## 2021-01-08 ENCOUNTER — Other Ambulatory Visit (HOSPITAL_COMMUNITY): Payer: Self-pay

## 2021-01-08 DIAGNOSIS — Z1231 Encounter for screening mammogram for malignant neoplasm of breast: Secondary | ICD-10-CM | POA: Diagnosis not present

## 2021-01-08 DIAGNOSIS — R52 Pain, unspecified: Secondary | ICD-10-CM | POA: Insufficient documentation

## 2021-01-08 DIAGNOSIS — R1012 Left upper quadrant pain: Secondary | ICD-10-CM | POA: Diagnosis not present

## 2021-01-08 DIAGNOSIS — K6389 Other specified diseases of intestine: Secondary | ICD-10-CM | POA: Diagnosis not present

## 2021-01-08 DIAGNOSIS — K3189 Other diseases of stomach and duodenum: Secondary | ICD-10-CM | POA: Diagnosis not present

## 2021-01-08 DIAGNOSIS — R1013 Epigastric pain: Secondary | ICD-10-CM | POA: Insufficient documentation

## 2021-01-08 DIAGNOSIS — N281 Cyst of kidney, acquired: Secondary | ICD-10-CM | POA: Diagnosis not present

## 2021-01-08 MED ORDER — IOHEXOL 300 MG/ML  SOLN
100.0000 mL | Freq: Once | INTRAMUSCULAR | Status: AC | PRN
  Administered 2021-01-08: 100 mL via INTRAVENOUS

## 2021-01-13 ENCOUNTER — Other Ambulatory Visit (HOSPITAL_COMMUNITY): Payer: Self-pay

## 2021-01-15 ENCOUNTER — Other Ambulatory Visit (HOSPITAL_COMMUNITY): Payer: Self-pay

## 2021-01-15 ENCOUNTER — Telehealth: Payer: Self-pay | Admitting: Gastroenterology

## 2021-01-15 NOTE — Telephone Encounter (Signed)
Patient returning missed call..Plz advise thanks

## 2021-01-15 NOTE — Telephone Encounter (Signed)
ATC pt no answer. LMTCB

## 2021-01-15 NOTE — Telephone Encounter (Signed)
Pt is still experiencing pain the left side of abd. She would like to know what the next step is. Pls call her.

## 2021-01-15 NOTE — Telephone Encounter (Signed)
Attempted to call patient no answer. LMTCB

## 2021-01-15 NOTE — Telephone Encounter (Signed)
Spoke with patient. Patient states her ongoing Left sided abdominal pain continues. She states she feels it has somewhat increased since last procedure. Reports pain 8-9/10- "throbbing, dull and crampy" like pain.  States she feels everything has been tried to figure out why pain remains and would just like to know "where to go from here." Please advise  Last OV 11/22/20 Last Imaging CT 01/08/21

## 2021-01-16 ENCOUNTER — Other Ambulatory Visit (HOSPITAL_COMMUNITY): Payer: Self-pay

## 2021-01-17 ENCOUNTER — Other Ambulatory Visit (HOSPITAL_COMMUNITY): Payer: Self-pay

## 2021-01-20 NOTE — Telephone Encounter (Signed)
Attempted to contact patient, no answer LMTCB 

## 2021-01-20 NOTE — Telephone Encounter (Signed)
Lauren, Can you call Andreea and see how she is doing If she still having problems, proceed with MRI abdomen with and without contrast (RE: abdominal pain, renal lesion) RG

## 2021-01-21 NOTE — Telephone Encounter (Signed)
Please refer to Result Note.

## 2021-01-22 ENCOUNTER — Other Ambulatory Visit (HOSPITAL_COMMUNITY): Payer: Self-pay

## 2021-01-22 MED FILL — Albuterol Sulfate Inhal Aero 108 MCG/ACT (90MCG Base Equiv): RESPIRATORY_TRACT | 50 days supply | Qty: 8.5 | Fill #0 | Status: AC

## 2021-01-29 ENCOUNTER — Other Ambulatory Visit (HOSPITAL_COMMUNITY): Payer: Self-pay

## 2021-01-29 MED FILL — Estradiol TD Patch Weekly 0.025 MG/24HR: TRANSDERMAL | 28 days supply | Qty: 4 | Fill #3 | Status: AC

## 2021-02-14 ENCOUNTER — Other Ambulatory Visit (HOSPITAL_COMMUNITY): Payer: Self-pay

## 2021-02-14 MED FILL — Sumatriptan Succinate Tab 50 MG: ORAL | 90 days supply | Qty: 27 | Fill #0 | Status: CN

## 2021-02-24 ENCOUNTER — Other Ambulatory Visit (HOSPITAL_COMMUNITY): Payer: Self-pay

## 2021-02-25 ENCOUNTER — Other Ambulatory Visit (HOSPITAL_COMMUNITY): Payer: Self-pay

## 2021-02-25 MED FILL — Sumatriptan Succinate Tab 50 MG: ORAL | 90 days supply | Qty: 27 | Fill #0 | Status: AC

## 2021-03-03 ENCOUNTER — Other Ambulatory Visit (HOSPITAL_COMMUNITY): Payer: Self-pay

## 2021-03-03 MED FILL — Estradiol TD Patch Weekly 0.025 MG/24HR: TRANSDERMAL | 28 days supply | Qty: 4 | Fill #4 | Status: AC

## 2021-03-31 ENCOUNTER — Other Ambulatory Visit (HOSPITAL_COMMUNITY): Payer: Self-pay

## 2021-03-31 MED FILL — Estradiol TD Patch Weekly 0.025 MG/24HR: TRANSDERMAL | 28 days supply | Qty: 4 | Fill #5 | Status: AC

## 2021-04-10 ENCOUNTER — Telehealth: Payer: Self-pay | Admitting: Plastic Surgery

## 2021-04-10 NOTE — Telephone Encounter (Signed)
Left voicemail for patient advising that I received her message to cancel her surgery scheduled for 12/8. I do not have OR availability for [facility] at this time but once I receive those dates, I can give her a call to let her know what I have so we can get her rescheduled. Advised patient to call us back to confirm she does want to cancel 12/8 and wait for new dates to come out.

## 2021-04-15 ENCOUNTER — Telehealth: Payer: Self-pay | Admitting: Plastic Surgery

## 2021-04-15 NOTE — Telephone Encounter (Signed)
Left a message for patient advising that surgery would be cancelled as requested. Advised her to call back when she is ready to reschedule.

## 2021-04-18 ENCOUNTER — Other Ambulatory Visit (HOSPITAL_COMMUNITY): Payer: Self-pay

## 2021-04-24 ENCOUNTER — Other Ambulatory Visit (HOSPITAL_COMMUNITY): Payer: Self-pay

## 2021-04-30 ENCOUNTER — Other Ambulatory Visit (HOSPITAL_COMMUNITY): Payer: Self-pay

## 2021-04-30 MED FILL — Estradiol TD Patch Weekly 0.025 MG/24HR: TRANSDERMAL | 28 days supply | Qty: 4 | Fill #6 | Status: AC

## 2021-05-13 DIAGNOSIS — R269 Unspecified abnormalities of gait and mobility: Secondary | ICD-10-CM | POA: Diagnosis not present

## 2021-05-13 DIAGNOSIS — M2569 Stiffness of other specified joint, not elsewhere classified: Secondary | ICD-10-CM | POA: Diagnosis not present

## 2021-05-13 DIAGNOSIS — M546 Pain in thoracic spine: Secondary | ICD-10-CM | POA: Diagnosis not present

## 2021-05-20 DIAGNOSIS — R269 Unspecified abnormalities of gait and mobility: Secondary | ICD-10-CM | POA: Diagnosis not present

## 2021-05-20 DIAGNOSIS — M2569 Stiffness of other specified joint, not elsewhere classified: Secondary | ICD-10-CM | POA: Diagnosis not present

## 2021-05-20 DIAGNOSIS — M546 Pain in thoracic spine: Secondary | ICD-10-CM | POA: Diagnosis not present

## 2021-05-23 ENCOUNTER — Encounter: Payer: 59 | Admitting: Physician Assistant

## 2021-05-23 ENCOUNTER — Encounter: Payer: 59 | Admitting: Surgical

## 2021-05-26 ENCOUNTER — Other Ambulatory Visit (HOSPITAL_COMMUNITY): Payer: Self-pay

## 2021-05-26 MED FILL — Estradiol TD Patch Weekly 0.025 MG/24HR: TRANSDERMAL | 28 days supply | Qty: 4 | Fill #7 | Status: AC

## 2021-05-27 DIAGNOSIS — R269 Unspecified abnormalities of gait and mobility: Secondary | ICD-10-CM | POA: Diagnosis not present

## 2021-05-27 DIAGNOSIS — M2569 Stiffness of other specified joint, not elsewhere classified: Secondary | ICD-10-CM | POA: Diagnosis not present

## 2021-05-27 DIAGNOSIS — M546 Pain in thoracic spine: Secondary | ICD-10-CM | POA: Diagnosis not present

## 2021-06-10 ENCOUNTER — Encounter: Payer: 59 | Admitting: Plastic Surgery

## 2021-06-17 ENCOUNTER — Encounter: Payer: 59 | Admitting: Surgical

## 2021-06-20 ENCOUNTER — Encounter: Payer: 59 | Admitting: Plastic Surgery

## 2021-06-23 ENCOUNTER — Other Ambulatory Visit (HOSPITAL_COMMUNITY): Payer: Self-pay

## 2021-06-23 ENCOUNTER — Other Ambulatory Visit: Payer: Self-pay | Admitting: Podiatry

## 2021-06-23 MED ORDER — CEPHALEXIN 500 MG PO CAPS: 500.0000 mg | ORAL_CAPSULE | Freq: Three times a day (TID) | ORAL | 1 refills | Status: DC

## 2021-06-23 MED FILL — Estradiol TD Patch Weekly 0.025 MG/24HR: TRANSDERMAL | 28 days supply | Qty: 4 | Fill #8 | Status: AC

## 2021-06-24 ENCOUNTER — Telehealth: Payer: Self-pay | Admitting: Urology

## 2021-06-24 NOTE — Telephone Encounter (Addendum)
DOS - 07/02/21  DOUBLE OSTEOTOMY RIGHT --- 28299 METATARSAL OSTEOTOMY 2 RIGHT --- 08144 HAMMERTOE REPAIR 2ND RIGHT  --- [id number]   [facility] EFFECTIVE DATE 07/06/20  PLAN DEDUCTIBLE - $300.00 W/ $0.00 REMAINING OUT OF POCKET - $7900.00 W/ $3,149.70 REMAINING COINSURANCE - 40% COPAY - $0.00   SPOKE WITH MARK WITH [facility] AND HE STATED THAT FOR CPT CODES 26378, 28308 ANS [id number] NO PRIOR AUTH IS REQUIRED.  REF # MARK W. 06/24/21 8:30 EST

## 2021-06-25 ENCOUNTER — Other Ambulatory Visit (HOSPITAL_COMMUNITY): Payer: Self-pay

## 2021-06-25 DIAGNOSIS — D485 Neoplasm of uncertain behavior of skin: Secondary | ICD-10-CM | POA: Diagnosis not present

## 2021-06-25 DIAGNOSIS — Z01419 Encounter for gynecological examination (general) (routine) without abnormal findings: Secondary | ICD-10-CM | POA: Diagnosis not present

## 2021-06-25 DIAGNOSIS — L814 Other melanin hyperpigmentation: Secondary | ICD-10-CM | POA: Diagnosis not present

## 2021-06-25 DIAGNOSIS — L820 Inflamed seborrheic keratosis: Secondary | ICD-10-CM | POA: Diagnosis not present

## 2021-06-25 DIAGNOSIS — D2239 Melanocytic nevi of other parts of face: Secondary | ICD-10-CM | POA: Diagnosis not present

## 2021-06-25 MED ORDER — ESTRADIOL 0.025 MG/24HR TD PTWK
0.0250 mg | MEDICATED_PATCH | TRANSDERMAL | 3 refills | Status: DC
  Filled 2021-06-25: qty 4, 28d supply, fill #0

## 2021-06-27 ENCOUNTER — Encounter: Payer: 59 | Admitting: Surgical

## 2021-07-04 DIAGNOSIS — Z719 Counseling, unspecified: Secondary | ICD-10-CM

## 2021-07-05 MED FILL — Sumatriptan Succinate Tab 50 MG: ORAL | 90 days supply | Qty: 27 | Fill #1 | Status: AC

## 2021-07-07 ENCOUNTER — Other Ambulatory Visit (HOSPITAL_COMMUNITY): Payer: Self-pay

## 2021-07-08 ENCOUNTER — Encounter: Payer: 59 | Admitting: Podiatry

## 2021-07-08 ENCOUNTER — Other Ambulatory Visit (HOSPITAL_COMMUNITY): Payer: Self-pay

## 2021-07-17 ENCOUNTER — Encounter: Payer: 59 | Admitting: Podiatry

## 2021-07-21 DIAGNOSIS — N200 Calculus of kidney: Secondary | ICD-10-CM | POA: Diagnosis not present

## 2021-07-21 DIAGNOSIS — N281 Cyst of kidney, acquired: Secondary | ICD-10-CM | POA: Diagnosis not present

## 2021-07-31 ENCOUNTER — Encounter: Payer: 59 | Admitting: Podiatry

## 2021-07-31 ENCOUNTER — Other Ambulatory Visit (HOSPITAL_COMMUNITY): Payer: Self-pay

## 2021-08-01 ENCOUNTER — Other Ambulatory Visit (HOSPITAL_COMMUNITY): Payer: Self-pay

## 2021-08-01 MED ORDER — ESTRADIOL 0.025 MG/24HR TD PTWK
0.0250 mg | MEDICATED_PATCH | TRANSDERMAL | 11 refills | Status: DC
  Filled 2021-08-01 – 2021-09-08 (×3): qty 4, 28d supply, fill #0
  Filled 2021-09-08 – 2021-10-08 (×2): qty 4, 28d supply, fill #1
  Filled 2021-11-12: qty 4, 28d supply, fill #2
  Filled 2021-12-16: qty 4, 28d supply, fill #3

## 2021-08-07 ENCOUNTER — Other Ambulatory Visit (HOSPITAL_COMMUNITY): Payer: Self-pay

## 2021-08-08 ENCOUNTER — Other Ambulatory Visit (HOSPITAL_COMMUNITY): Payer: Self-pay

## 2021-08-11 ENCOUNTER — Other Ambulatory Visit (HOSPITAL_COMMUNITY): Payer: Self-pay

## 2021-09-08 ENCOUNTER — Other Ambulatory Visit: Payer: Self-pay | Admitting: Cardiology

## 2021-09-08 ENCOUNTER — Other Ambulatory Visit (HOSPITAL_COMMUNITY): Payer: Self-pay

## 2021-09-08 MED ORDER — DILTIAZEM HCL ER COATED BEADS 180 MG PO CP24
ORAL_CAPSULE | Freq: Every day | ORAL | 0 refills | Status: DC
  Filled 2021-09-08: qty 30, 30d supply, fill #0

## 2021-09-09 ENCOUNTER — Other Ambulatory Visit (HOSPITAL_COMMUNITY): Payer: Self-pay

## 2021-10-07 ENCOUNTER — Ambulatory Visit (INDEPENDENT_AMBULATORY_CARE_PROVIDER_SITE_OTHER): Payer: Self-pay | Admitting: Plastic Surgery

## 2021-10-07 ENCOUNTER — Other Ambulatory Visit: Payer: Self-pay | Admitting: Otolaryngology

## 2021-10-07 ENCOUNTER — Other Ambulatory Visit (HOSPITAL_COMMUNITY): Payer: Self-pay | Admitting: Otolaryngology

## 2021-10-07 ENCOUNTER — Encounter: Payer: Self-pay | Admitting: Plastic Surgery

## 2021-10-07 DIAGNOSIS — Z719 Counseling, unspecified: Secondary | ICD-10-CM

## 2021-10-07 DIAGNOSIS — H9041 Sensorineural hearing loss, unilateral, right ear, with unrestricted hearing on the contralateral side: Secondary | ICD-10-CM | POA: Diagnosis not present

## 2021-10-07 DIAGNOSIS — H918X9 Other specified hearing loss, unspecified ear: Secondary | ICD-10-CM

## 2021-10-07 DIAGNOSIS — H9311 Tinnitus, right ear: Secondary | ICD-10-CM | POA: Diagnosis not present

## 2021-10-07 NOTE — Progress Notes (Signed)
Kybella Procedure Note ? ?Procedure: Cosmetic deoxycholic acid injection (1 vial was used) ? ?Pre-operative Diagnosis: Submental fullness ? ?Post-operative Diagnosis: Same ? ?Complications:  None ? ?Brief history: The patient desires improvement in the appearance of moderate to severe convexity or fullness associated with submental fat. I discussed with the patient this proposed procedure of Kybella, which is customized depending on the particular needs of the patient. It is performed on the submental area for reduction in the fat.  The alternatives were discussed with the patient. The risks were addressed including bleeding, scarring, infection, damage to deeper structures, asymmetry, numbness, formation of areas of hardness, swelling, nodules, skin ulceration, headache, alopecia, difficulty swallowing, and muscle weakness. Additionally, marginal mandibular nerve injury could occur and is manifested as an asymmetric smile or facial muscle weakness.  The individual's choice to undergo a surgical procedure is based on the comparison of risks to potential benefits. Injections do not arrest the aging process or produce permanent tightening of the skin.  Operative intervention maybe necessary to maintain the results. The patient understands and wishes to proceed. An informed consent was signed and informational brochures given to her prior to the procedure. ? ?Procedure: The area was prepped with alcohol and dried with a clean gauze. Using a clean technique, the submental area was palpated and pinched.  The skin at the area was pulled and there was appropriate elasticity noted without excessive skin laxity. The patient was asked to tense the platysma to define the subcutaneous fat between the dermis and platysma.  A skin marker was used to mark the anterior, posterior and lateral borders of the submental fat compartment. The "no treatment Zone" was marked. The treatment zone was injected in the subcutaneous layer with  lidocaine 1% with epinepherine. The grid was placed on the skin with the water over it for activation of the ink.  The clear protective sheet was removed leaving the markings.  The dots outside the previously marked treatment zone were removed with an alcohol wipe.  The pre-platysmal fat was pinched with 2 fingers.  Each dot was injected perpendicular to the skin with .2 cc of Kybella using a 1 ml syring and a 30 gauge needle. The ink was wiped off the skin with an alcohol wipe and a cold pack was applied to the treatment area. No complications were noted. Light pressure with an ice pack was held for 5 minutes. She was instructed explicitly in post-operative care including no massage, heavy activity, work out or facial for 24 hours. ? ?Deoxycholic Acid ?LOT:  [id number] ?EXP:  March 2024 ? ? ? ?

## 2021-10-07 NOTE — Addendum Note (Signed)
Addended by: Harl Bowie on: 10/07/2021 04:25 PM ? ? Modules accepted: Orders ? ?

## 2021-10-08 ENCOUNTER — Other Ambulatory Visit (HOSPITAL_COMMUNITY): Payer: Self-pay

## 2021-10-09 ENCOUNTER — Other Ambulatory Visit (HOSPITAL_COMMUNITY): Payer: Self-pay

## 2021-10-10 ENCOUNTER — Other Ambulatory Visit (HOSPITAL_COMMUNITY): Payer: Self-pay

## 2021-10-17 ENCOUNTER — Ambulatory Visit (HOSPITAL_BASED_OUTPATIENT_CLINIC_OR_DEPARTMENT_OTHER)
Admission: RE | Admit: 2021-10-17 | Discharge: 2021-10-17 | Disposition: A | Discharge status: Home / Self Care | Payer: 59 | Source: Ambulatory Visit | Attending: Otolaryngology | Admitting: Otolaryngology

## 2021-10-17 DIAGNOSIS — H918X9 Other specified hearing loss, unspecified ear: Secondary | ICD-10-CM

## 2021-10-17 DIAGNOSIS — H9191 Unspecified hearing loss, right ear: Secondary | ICD-10-CM | POA: Insufficient documentation

## 2021-10-17 DIAGNOSIS — H9190 Unspecified hearing loss, unspecified ear: Secondary | ICD-10-CM | POA: Diagnosis not present

## 2021-10-17 MED ORDER — GADOBUTROL 1 MMOL/ML IV SOLN
8.5000 mL | Freq: Once | INTRAVENOUS | Status: AC | PRN
  Administered 2021-10-17: 8.5 mL via INTRAVENOUS
  Filled 2021-10-17: qty 10

## 2021-10-20 ENCOUNTER — Ambulatory Visit (HOSPITAL_COMMUNITY): Payer: 59

## 2021-10-21 ENCOUNTER — Ambulatory Visit (HOSPITAL_COMMUNITY): Payer: 59

## 2021-10-21 ENCOUNTER — Other Ambulatory Visit: Payer: 59

## 2021-10-24 ENCOUNTER — Other Ambulatory Visit: Payer: Self-pay | Admitting: Cardiology

## 2021-10-24 ENCOUNTER — Other Ambulatory Visit (HOSPITAL_COMMUNITY): Payer: Self-pay

## 2021-10-24 ENCOUNTER — Other Ambulatory Visit (HOSPITAL_BASED_OUTPATIENT_CLINIC_OR_DEPARTMENT_OTHER): Payer: 59

## 2021-10-24 DIAGNOSIS — J0100 Acute maxillary sinusitis, unspecified: Secondary | ICD-10-CM | POA: Diagnosis not present

## 2021-10-24 DIAGNOSIS — H6982 Other specified disorders of Eustachian tube, left ear: Secondary | ICD-10-CM | POA: Diagnosis not present

## 2021-10-24 MED ORDER — DILTIAZEM HCL ER COATED BEADS 180 MG PO CP24
180.0000 mg | ORAL_CAPSULE | Freq: Every day | ORAL | 0 refills | Status: DC
  Filled 2021-10-24: qty 15, 15d supply, fill #0

## 2021-11-07 DIAGNOSIS — Z79899 Other long term (current) drug therapy: Secondary | ICD-10-CM | POA: Diagnosis not present

## 2021-11-07 DIAGNOSIS — J310 Chronic rhinitis: Secondary | ICD-10-CM | POA: Diagnosis not present

## 2021-11-07 DIAGNOSIS — G4733 Obstructive sleep apnea (adult) (pediatric): Secondary | ICD-10-CM | POA: Diagnosis not present

## 2021-11-07 DIAGNOSIS — H6981 Other specified disorders of Eustachian tube, right ear: Secondary | ICD-10-CM | POA: Diagnosis not present

## 2021-11-07 DIAGNOSIS — J329 Chronic sinusitis, unspecified: Secondary | ICD-10-CM | POA: Diagnosis not present

## 2021-11-10 ENCOUNTER — Other Ambulatory Visit (HOSPITAL_COMMUNITY): Payer: Self-pay

## 2021-11-12 ENCOUNTER — Other Ambulatory Visit: Payer: Self-pay | Admitting: Cardiology

## 2021-11-12 ENCOUNTER — Other Ambulatory Visit (HOSPITAL_COMMUNITY): Payer: Self-pay

## 2021-11-12 MED ORDER — DILTIAZEM HCL ER COATED BEADS 180 MG PO CP24
180.0000 mg | ORAL_CAPSULE | Freq: Every day | ORAL | 0 refills | Status: DC
  Filled 2021-11-12: qty 90, 90d supply, fill #0

## 2021-11-13 ENCOUNTER — Other Ambulatory Visit (HOSPITAL_COMMUNITY): Payer: Self-pay

## 2021-11-21 ENCOUNTER — Ambulatory Visit: Payer: 59 | Admitting: Podiatry

## 2021-11-21 DIAGNOSIS — L600 Ingrowing nail: Secondary | ICD-10-CM

## 2021-11-21 DIAGNOSIS — H524 Presbyopia: Secondary | ICD-10-CM | POA: Diagnosis not present

## 2021-11-21 NOTE — Progress Notes (Signed)
Subjective: 51 year old female presents the office today for concerns of ingrown toenails of both corners of her left third and fourth digits should have been removed today.  No swelling redness or any drainage that she reports the area is tender with pressure.  Objective: AAO x3, NAD DP/PT pulses palpable bilaterally, CRT less than 3 seconds Incurvation present to both the medial lateral aspects of the left third and fourth digits.  There is no drainage or pus.  Slight edema and faint erythema likely more from inflammation as opposed to infection.  No signs of infection today.  Similar symptoms in the right third and fourth toes but no significant pain today No pain with calf compression, swelling, warmth, erythema  Assessment: Ingrown toenail left  Plan: -All treatment options discussed with the patient including all alternatives, risks, complications.  -At this time, the patient is requesting partial nail removal with chemical matricectomy to the symptomatic portion of the nail. Risks and complications were discussed with the patient for which they understand and written consent was obtained. Under sterile conditions a total of 3 mL of a mixture of 2% lidocaine plain and 0.5% Marcaine plain was infiltrated in a digital block fashion. Once anesthetized, the skin was prepped in sterile fashion. A tourniquet was then applied. Next the medial, lateral aspect of left third and fourth digit nail borders were then sharply excised making sure to remove the entire offending nail border. Once the nails were ensured to be removed area was debrided and the underlying skin was intact. There is no purulence identified in the procedure. Next phenol was then applied under standard conditions and copiously irrigated. Silvadene was applied. A dry sterile dressing was applied. After application of the dressing the tourniquet was removed and there is found to be an immediate capillary refill time to the digit. The  patient tolerated the procedure well any complications. Post procedure instructions were discussed the patient for which he verbally understood. Follow-up in one week for nail check or sooner if any problems are to arise. Discussed signs/symptoms of infection and directed to call the office immediately should any occur or go directly to the emergency room. In the meantime, encouraged to call the office with any questions, concerns, changes symptoms. -Patient encouraged to call the office with any questions, concerns, change in symptoms.   Trula Slade DPM

## 2021-11-25 ENCOUNTER — Ambulatory Visit: Payer: 59 | Admitting: Podiatry

## 2021-11-25 DIAGNOSIS — L600 Ingrowing nail: Secondary | ICD-10-CM

## 2021-12-02 NOTE — Progress Notes (Signed)
Subjective: 51 year old female presents the office today for concerns of ingrown toenails of both corners of her right third and fourth digits should have been removed today.  She said the left side is been doing well.  No swelling redness or any drainage that she reports the area is tender with pressure.  Objective: AAO x3, NAD DP/PT pulses palpable bilaterally, CRT less than 3 seconds Incurvation present to both the medial and lateral aspects of the right third and fourth digits.  There is no drainage or pus.  Slight edema and faint erythema likely more from inflammation as opposed to infection.  No signs of infection today.  Similar symptoms in the right third and fourth toes but no significant pain today. At the left procedure sites appear to be healing well.  Small granulation tissue still present but there is no edema, erythema or signs of infection. No pain with calf compression, swelling, warmth, erythema  Assessment: Ingrown toenail right  Plan: -All treatment options discussed with the patient including all alternatives, risks, complications.  -At this time, the patient is requesting partial nail removal with chemical matricectomy to the symptomatic portion of the nail. Risks and complications were discussed with the patient for which they understand and written consent was obtained. Under sterile conditions a total of 3 mL of a mixture of 2% lidocaine plain and 0.5% Marcaine plain was infiltrated in a digital block fashion. Once anesthetized, the skin was prepped in sterile fashion. A tourniquet was then applied. Next the medial, lateral aspect of right third and fourth digit nail borders were then sharply excised making sure to remove the entire offending nail border. Once the nails were ensured to be removed area was debrided and the underlying skin was intact. There is no purulence identified in the procedure. Next phenol was then applied under standard conditions and copiously  irrigated. Silvadene was applied. A dry sterile dressing was applied. After application of the dressing the tourniquet was removed and there is found to be an immediate capillary refill time to the digit. The patient tolerated the procedure well any complications. Post procedure instructions were discussed the patient for which he verbally understood. Discussed signs/symptoms of infection and directed to call the office immediately should any occur or go directly to the emergency room. In the meantime, encouraged to call the office with any questions, concerns, changes symptoms. -Patient encouraged to call the office with any questions, concerns, change in symptoms.   Trula Slade DPM

## 2021-12-12 DIAGNOSIS — H9041 Sensorineural hearing loss, unilateral, right ear, with unrestricted hearing on the contralateral side: Secondary | ICD-10-CM | POA: Diagnosis not present

## 2021-12-12 DIAGNOSIS — J358 Other chronic diseases of tonsils and adenoids: Secondary | ICD-10-CM | POA: Diagnosis not present

## 2021-12-13 DIAGNOSIS — J358 Other chronic diseases of tonsils and adenoids: Secondary | ICD-10-CM | POA: Insufficient documentation

## 2021-12-16 ENCOUNTER — Other Ambulatory Visit (HOSPITAL_COMMUNITY): Payer: Self-pay

## 2021-12-17 ENCOUNTER — Other Ambulatory Visit (HOSPITAL_COMMUNITY): Payer: Self-pay

## 2021-12-17 DIAGNOSIS — H9041 Sensorineural hearing loss, unilateral, right ear, with unrestricted hearing on the contralateral side: Secondary | ICD-10-CM | POA: Diagnosis not present

## 2021-12-25 ENCOUNTER — Other Ambulatory Visit (HOSPITAL_COMMUNITY): Payer: Self-pay

## 2021-12-25 MED ORDER — SUMATRIPTAN SUCCINATE 50 MG PO TABS
ORAL_TABLET | ORAL | 2 refills | Status: AC
Start: 2021-12-25 — End: ?
  Filled 2021-12-25: qty 27, 90d supply, fill #0

## 2022-01-09 ENCOUNTER — Ambulatory Visit (INDEPENDENT_AMBULATORY_CARE_PROVIDER_SITE_OTHER): Payer: Self-pay | Admitting: Plastic Surgery

## 2022-01-09 ENCOUNTER — Encounter: Payer: Self-pay | Admitting: Plastic Surgery

## 2022-01-09 DIAGNOSIS — Z719 Counseling, unspecified: Secondary | ICD-10-CM

## 2022-01-09 NOTE — Progress Notes (Signed)
The patient is a 51 year old female here for follow-up after undergoing Kybella injections in her submandibular area.  This was 3 months ago.  She looks really good.  I think it might be worth waiting 2 more months before we do the Kybella again see if she gets any continued improvement since she is doing so well.  The patient is in agreement I will see her back in 2 weeks.  Pictures were obtained of the patient and placed in the chart with the patient's or guardian's permission.

## 2022-01-27 NOTE — Telephone Encounter (Signed)
error 

## 2022-02-09 ENCOUNTER — Ambulatory Visit: Payer: 59 | Admitting: Cardiology

## 2022-02-11 ENCOUNTER — Encounter (INDEPENDENT_AMBULATORY_CARE_PROVIDER_SITE_OTHER): Payer: Self-pay

## 2022-02-20 ENCOUNTER — Other Ambulatory Visit: Payer: Self-pay | Admitting: Obstetrics and Gynecology

## 2022-02-20 DIAGNOSIS — Z1231 Encounter for screening mammogram for malignant neoplasm of breast: Secondary | ICD-10-CM

## 2022-03-13 ENCOUNTER — Ambulatory Visit: Payer: 59 | Admitting: Plastic Surgery

## 2022-03-17 ENCOUNTER — Other Ambulatory Visit: Payer: Self-pay

## 2022-03-18 ENCOUNTER — Telehealth: Payer: Self-pay | Admitting: Cardiology

## 2022-03-18 ENCOUNTER — Other Ambulatory Visit (HOSPITAL_COMMUNITY): Payer: Self-pay

## 2022-03-18 MED ORDER — DILTIAZEM HCL ER COATED BEADS 180 MG PO CP24
180.0000 mg | ORAL_CAPSULE | Freq: Every day | ORAL | 0 refills | Status: DC
  Filled 2022-03-18: qty 15, 15d supply, fill #0

## 2022-03-18 NOTE — Telephone Encounter (Signed)
Medication sent with instruction to arrange appt for further refills, Final attempt

## 2022-03-18 NOTE — Telephone Encounter (Signed)
*  STAT* If patient is at the pharmacy, call can be transferred to refill team.   1. Which medications need to be refilled? (please list name of each medication and dose if known) diltiazem (CARDIZEM CD) 180 MG 24 hr capsule  2. Which pharmacy/location (including street and city if local pharmacy) is medication to be sent to? [facility] - [place], [address]  3. Do they need a 30 day or 90 day supply? [place]

## 2022-03-19 ENCOUNTER — Other Ambulatory Visit (HOSPITAL_COMMUNITY): Payer: Self-pay

## 2022-03-20 ENCOUNTER — Other Ambulatory Visit (HOSPITAL_COMMUNITY): Payer: Self-pay

## 2022-03-23 ENCOUNTER — Ambulatory Visit
Admission: RE | Admit: 2022-03-23 | Discharge: 2022-03-23 | Disposition: A | Discharge status: Home / Self Care | Payer: Self-pay | Source: Ambulatory Visit | Attending: Obstetrics and Gynecology | Admitting: Obstetrics and Gynecology

## 2022-03-23 DIAGNOSIS — Z1231 Encounter for screening mammogram for malignant neoplasm of breast: Secondary | ICD-10-CM

## 2022-03-30 ENCOUNTER — Telehealth: Payer: Self-pay | Admitting: Plastic Surgery

## 2022-03-30 NOTE — Telephone Encounter (Signed)
2nd message left for pt to return call to reschedule appt on 05/08/22 due to provider schedule conflict.

## 2022-04-02 ENCOUNTER — Telehealth: Payer: Self-pay | Admitting: Cardiology

## 2022-04-02 ENCOUNTER — Other Ambulatory Visit: Payer: Self-pay

## 2022-04-02 MED ORDER — DILTIAZEM HCL ER COATED BEADS 180 MG PO CP24
180.0000 mg | ORAL_CAPSULE | Freq: Every day | ORAL | 0 refills | Status: DC
Start: 2022-04-02 — End: 2022-05-05

## 2022-04-02 NOTE — Telephone Encounter (Signed)
Pt c/o medication issue:  1. Name of Medication: diltiazem (CARDIZEM CD) 180 MG 24 hr capsule  2. How are you currently taking this medication (dosage and times per day)? Take 1 capsule (180 mg total) by mouth daily  3. Are you having a reaction (difficulty breathing--STAT)? No   4. What is your medication issue? Please send new prescription to [facility], [place]

## 2022-05-05 ENCOUNTER — Encounter: Payer: Self-pay | Admitting: Cardiology

## 2022-05-05 ENCOUNTER — Ambulatory Visit: Payer: Commercial Managed Care - PPO | Attending: Cardiology | Admitting: Cardiology

## 2022-05-05 VITALS — BP 146/86 | HR 82 | Ht 66.0 in | Wt 200.4 lb

## 2022-05-05 DIAGNOSIS — I471 Supraventricular tachycardia, unspecified: Secondary | ICD-10-CM

## 2022-05-05 DIAGNOSIS — I10 Essential (primary) hypertension: Secondary | ICD-10-CM

## 2022-05-05 DIAGNOSIS — E782 Mixed hyperlipidemia: Secondary | ICD-10-CM

## 2022-05-05 DIAGNOSIS — R002 Palpitations: Secondary | ICD-10-CM | POA: Diagnosis not present

## 2022-05-05 MED ORDER — DILTIAZEM HCL ER COATED BEADS 180 MG PO CP24: 180.0000 mg | ORAL_CAPSULE | Freq: Every day | ORAL | 2 refills | Status: DC

## 2022-05-05 NOTE — Patient Instructions (Signed)
Medication Instructions:  Your physician recommends that you continue on your current medications as directed. Please refer to the Current Medication list given to you today.  *If you need a refill on your cardiac medications before your next appointment, please call your pharmacy*   Lab Work: Your physician recommends that you have a chem 7 and direct LDL If you have labs (blood work) drawn today and your tests are completely normal, you will receive your results only by: [place] (if you have MyChart) OR A paper copy in the mail If you have any lab test that is abnormal or we need to change your treatment, we will call you to review the results.   Testing/Procedures: None ordered   Follow-Up: At [facility], you and your health needs are our priority.  As part of our continuing mission to provide you with exceptional heart care, we have created designated Provider Care Teams.  These Care Teams include your primary Cardiologist (physician) and Advanced Practice Providers (APPs -  Physician Assistants and Nurse Practitioners) who all work together to provide you with the care you need, when you need it.  We recommend signing up for the patient portal called "MyChart".  Sign up information is provided on this After Visit Summary.  MyChart is used to connect with patients for Virtual Visits (Telemedicine).  Patients are able to view lab/test results, encounter notes, upcoming appointments, etc.  Non-urgent messages can be sent to your provider as well.   To learn more about what you can do with MyChart, go to [email or web address].    Your next appointment:   12 month(s)  The format for your next appointment:   In Person  Provider:   Jenne Campus, MD   Other Instructions NA

## 2022-05-05 NOTE — Addendum Note (Signed)
Addended by: Truddie Hidden on: 05/05/2022 03:19 PM   Modules accepted: Orders

## 2022-05-05 NOTE — Progress Notes (Signed)
Cardiology Office Note:    Date:  05/05/2022   ID:  [name], DOB August 09, 1970, MRN [id number]  PCP:  Myrlene Broker, MD  Cardiologist:  Jenne Campus, MD    Referring MD: Myrlene Broker, MD   Chief Complaint  Patient presents with   Medication Management  Doing fine  History of Present Illness:    [name] is a 51 y.o. female with past medical history significant for supraventricular tachycardia, nonsustained ventricular tachycardia, essential hypertension, dyslipidemia.  She comes today to my office for follow-up.  Overall she is doing very well, 70 chest pain tightness squeezing pressure burning chest no palpitations dizziness swelling of lower extremities.  Past Medical History:  Diagnosis Date   Anemia    Asthma    as child   Atypical chest pain 04/13/2017   Back pain    Cellulitis of umbilicus 17/40/8144   Chronic pain of both knees 10/18/2015   Class 1 obesity with serious comorbidity and body mass index (BMI) of 30.0 to 30.9 in adult 08/31/2016   Starting BMI greater then 30   Complication of anesthesia    Cyst of left ovary 04/11/2020   Cystocele with incomplete uterovaginal prolapse 06/21/2018   Depression 02/24/2017   Dermatochalasis of both eyelids 03/19/2020   Elevated serum creatinine 05/26/2017   Encounter for annual physical examination excluding gynecological examination in a patient older than 17 years 02/20/2019   Encounter for counseling 12/01/2019   Encounter for laboratory testing for COVID-19 virus 02/20/2019   Essential hypertension 02/24/2017   Fibroids, intramural 04/05/2017   Gastroesophageal reflux disease without esophagitis 04/05/2017   GERD (gastroesophageal reflux disease)    Headache    History of migraine headaches 04/05/2017   Hyperlipidemia 02/24/2017   Hypertension    Insulin resistance 09/01/2017   Joint pain    Major depressive disorder, recurrent episode ([place]) 04/05/2017   Migraine without status migrainosus, not intractable  05/26/2017   Migraines    Mild intermittent asthma without complication 81/02/5630   Need for influenza vaccination 04/14/2018   Other constipation 05/08/2019   Other hyperlipidemia 09/01/2017   Overweight with body mass index (BMI) 25.0-29.9 05/11/2018   Starting BMI greater then 30   Palpitations 12/01/2019   Parathyroid abnormality (HCC)    Parathyroid adenoma 01/03/2016   Plantar fasciitis 11/16/2018   PONV (postoperative nausea and vomiting)    Rhinosinusitis 04/05/2017   SVT (supraventricular tachycardia) 01/19/2020   Swelling    feet and legs   Swelling of both lower extremities 12/01/2019   Uterine prolapse 04/05/2017   Vitamin D deficiency     Past Surgical History:  Procedure Laterality Date   ABDOMINAL HYSTERECTOMY     BROW LIFT Bilateral 03/13/2020   Procedure: Bilteral upper lid blepharoplasty;  Surgeon: Wallace Going, DO;  Location: [facility];  Service: Plastics;  Laterality: Bilateral;  1.5 hours   ESOPHAGOGASTRODUODENOSCOPY  09/13/2003   Normal EGD.    EXTRACORPOREAL SHOCK WAVE LITHOTRIPSY Left 02/12/2020   Procedure: EXTRACORPOREAL SHOCK WAVE LITHOTRIPSY (ESWL);  Surgeon: Festus Aloe, MD;  Location: [facility];  Service: Urology;  Laterality: Left;   FOOT SURGERY Bilateral 2001   PARATHYROIDECTOMY N/A 01/03/2016   Procedure: PARATHYROIDECTOMY;  Surgeon: Leta Baptist, MD;  Location: [facility];  Service: ENT;  Laterality: N/A;   TUBAL LIGATION      Current Medications: Current Meds  Medication Sig   albuterol (VENTOLIN HFA) 108 (90 Base) MCG/ACT inhaler Inhale 1 puff  into the lungs every 6 (six) hours as needed for shortness of breath or wheezing.   diltiazem (CARDIZEM CD) 180 MG 24 hr capsule Take 1 capsule (180 mg total) by mouth daily. Patient needs an appointment for further refills.   estradiol (CLIMARA - DOSED IN MG/24 HR) 0.025 mg/24hr patch Place 1 patch onto the skin every 7 (seven) days.   fluticasone  (FLONASE) 50 MCG/ACT nasal spray PLACE 1 SPRAY IN EACH NOSTRIL 2 TIMES DAILY (Patient taking differently: Place 2 sprays into both nostrils 2 (two) times daily.)   Multiple Vitamin (MULTIVITAMIN) tablet Take 1 tablet by mouth every other day.   SUMAtriptan (IMITREX) 50 MG tablet Take as directed. Max '200mg'$  per day (4 tabs) (Patient taking differently: Take 50-200 mg by mouth every 2 (two) hours as needed for migraine or headache.)   [DISCONTINUED] diltiazem (CARDIZEM CD) 180 MG 24 hr capsule Take 1 capsule (180 mg total) by mouth daily. Patient needs an appointment for further refills.   [DISCONTINUED] fluticasone (FLONASE) 50 MCG/ACT nasal spray Place 1 spray into both nostrils 2 (two) times daily.   [DISCONTINUED] SUMAtriptan (IMITREX) 50 MG tablet TAKE 1 TABLET BY MOUTH AS DIRECTED, MAXIMUM DAILY DOSE OF 2OO MG     Allergies:   Patient has no known allergies.   Social History   Socioeconomic History   Marital status: Married    Spouse name: Ronnie Babel   Number of children: Not on file   Years of education: Not on file   Highest education level: Not on file  Occupational History   Occupation: CMA    Employer: [employer]  Tobacco Use   Smoking status: Never   Smokeless tobacco: Never  Vaping Use   Vaping Use: Never used  Substance and Sexual Activity   Alcohol use: No   Drug use: No   Sexual activity: Yes    Partners: Male    Birth control/protection: Surgical    Comment: TL  Other Topics Concern   Not on file  Social History Narrative   Not on file   Social Determinants of Health   Financial Resource Strain: Not on file  Food Insecurity: Not on file  Transportation Needs: Not on file  Physical Activity: Not on file  Stress: Not on file  Social Connections: Not on file     Family History: The patient's family history includes Cancer in her maternal grandmother and paternal grandmother; Diabetes in her paternal grandmother; Heart disease in her paternal grandmother;  Hypertension in her father; Melanoma in her maternal grandfather. There is no history of Colon cancer, Colon polyps, Esophageal cancer, Rectal cancer, Stomach cancer, or Breast cancer. ROS:   Please see the history of present illness.    All 14 point review of systems negative except as described per history of present illness  EKGs/Labs/Other Studies Reviewed:      Recent Labs: No results found for requested labs within last 365 days.  Recent Lipid Panel    Component Value Date/Time   CHOL 205 (H) 02/06/2019 1010   TRIG 87 02/06/2019 1010   HDL 56 02/06/2019 1010   LDLCALC 132 (H) 02/06/2019 1010    Physical Exam:    VS:  BP (!) 146/86 (BP Location: Right Arm, Patient Position: Sitting)   Pulse 82   Ht '5\' 6"'$  (1.676 m)   Wt 200 lb 6.4 oz (90.9 kg)   LMP 04/28/2017 (Exact Date)   SpO2 94%   BMI 32.35 kg/m     Wt  Readings from Last 3 Encounters:  05/05/22 200 lb 6.4 oz (90.9 kg)  11/22/20 188 lb (85.3 kg)  08/02/20 193 lb (87.5 kg)     GEN:  Well nourished, well developed in no acute distress HEENT: Normal NECK: No JVD; No carotid bruits LYMPHATICS: No lymphadenopathy CARDIAC: RRR, no murmurs, no rubs, no gallops RESPIRATORY:  Clear to auscultation without rales, wheezing or rhonchi  ABDOMEN: Soft, non-tender, non-distended MUSCULOSKELETAL:  No edema; No deformity  SKIN: Warm and dry LOWER EXTREMITIES: no swelling NEUROLOGIC:  Alert and oriented x 3 PSYCHIATRIC:  Normal affect   ASSESSMENT:    1. SVT (supraventricular tachycardia)   2. Essential hypertension   3. Mixed hyperlipidemia   4. Palpitations    PLAN:    In order of problems listed above:  Supraventricular tachycardia successfully suppressed with calcium channel blocker which I will continue. Essential hypertension uncontrolled she tells me anytime she check her blood pressure is elevated I check Chem-7 today if Chem-7 is fine we will initiate losartan 25 daily. Mixed dyslipidemia I did review her  K PN which only dated from 2020 with LDL of 132 HDL 56 last year we calculated her 10 years predicted risk which was very low not enough to initiate medical therapy, however, asked her to have fasting lipid profile done. Palpitations: Denies having any   Medication Adjustments/Labs and Tests Ordered: Current medicines are reviewed at length with the patient today.  Concerns regarding medicines are outlined above.  No orders of the defined types were placed in this encounter.  Medication changes:  Meds ordered this encounter  Medications   diltiazem (CARDIZEM CD) 180 MG 24 hr capsule    Sig: Take 1 capsule (180 mg total) by mouth daily. Patient needs an appointment for further refills.    Dispense:  90 capsule    Refill:  2    Signed, Park Liter, MD, [facility] 05/05/2022 3:12 PM    [place]

## 2022-05-06 LAB — BASIC METABOLIC PANEL
BUN/Creatinine Ratio: 31 — ABNORMAL HIGH (ref 9–23)
BUN: 28 mg/dL — ABNORMAL HIGH (ref 6–24)
CO2: 22 mmol/L (ref 20–29)
Calcium: 9.7 mg/dL (ref 8.7–10.2)
Chloride: 101 mmol/L (ref 96–106)
Creatinine, Ser: 0.89 mg/dL (ref 0.57–1.00)
Glucose: 79 mg/dL (ref 70–99)
Potassium: 4.9 mmol/L (ref 3.5–5.2)
Sodium: 141 mmol/L (ref 134–144)
eGFR: 78 mL/min/{1.73_m2} (ref >59)

## 2022-05-06 LAB — LDL CHOLESTEROL, DIRECT: LDL Direct: 140 mg/dL — ABNORMAL HIGH (ref 0–99)

## 2022-05-08 ENCOUNTER — Telehealth: Payer: Self-pay

## 2022-05-08 ENCOUNTER — Ambulatory Visit: Payer: 59 | Admitting: Plastic Surgery

## 2022-05-08 ENCOUNTER — Other Ambulatory Visit (HOSPITAL_COMMUNITY): Payer: Self-pay

## 2022-05-08 MED ORDER — LOSARTAN POTASSIUM 25 MG PO TABS
25.0000 mg | ORAL_TABLET | Freq: Every day | ORAL | 3 refills | Status: DC
  Filled 2022-05-08: qty 30, 30d supply, fill #0

## 2022-05-08 NOTE — Telephone Encounter (Signed)
LVM for pt and My chart message regarding lab results. Encouraged to call with any questions or concerns. Routed to PCP.

## 2022-05-11 ENCOUNTER — Telehealth: Payer: Self-pay | Admitting: Cardiology

## 2022-05-11 MED ORDER — DILTIAZEM HCL ER COATED BEADS 180 MG PO CP24: 180.0000 mg | ORAL_CAPSULE | Freq: Every day | ORAL | 3 refills | Status: DC

## 2022-05-11 NOTE — Telephone Encounter (Signed)
*  STAT* If patient is at the pharmacy, call can be transferred to refill team.   1. Which medications need to be refilled? (please list name of each medication and dose if known)  new prescription for Diltiazemo  2. Which pharmacy/location (including street and city if local pharmacy) is medication to be sent to? [facility]  3. Do they need a 30 day or 90 day supply? 90 days and refills

## 2022-05-15 ENCOUNTER — Ambulatory Visit: Payer: 59 | Admitting: Plastic Surgery

## 2022-05-18 ENCOUNTER — Other Ambulatory Visit (HOSPITAL_COMMUNITY): Payer: Self-pay

## 2022-06-16 ENCOUNTER — Telehealth: Payer: Self-pay | Admitting: Plastic Surgery

## 2022-06-16 NOTE — Telephone Encounter (Signed)
Called pt to see if we could change her appt time to 9am instead of 3:15 due to scheduling conflicts for provider.  Asked pt to contact our office asap to confirm.

## 2022-06-23 ENCOUNTER — Ambulatory Visit: Payer: 59 | Admitting: Plastic Surgery

## 2022-07-06 HISTORY — PX: TONSILLECTOMY: SUR1361

## 2022-09-04 ENCOUNTER — Ambulatory Visit: Payer: 59 | Admitting: Plastic Surgery

## 2022-09-10 ENCOUNTER — Telehealth: Payer: Self-pay | Admitting: Plastic Surgery

## 2022-09-10 NOTE — Telephone Encounter (Signed)
LVM for pt to arrive 30 min prior to her appt to have numbing cream put on prior to procedure.

## 2022-09-11 ENCOUNTER — Encounter: Payer: Self-pay | Admitting: Plastic Surgery

## 2022-09-11 ENCOUNTER — Ambulatory Visit (INDEPENDENT_AMBULATORY_CARE_PROVIDER_SITE_OTHER): Payer: Self-pay | Admitting: Plastic Surgery

## 2022-09-11 VITALS — BP 123/81 | HR 87

## 2022-09-11 DIAGNOSIS — Z719 Counseling, unspecified: Secondary | ICD-10-CM

## 2022-09-11 NOTE — Progress Notes (Unsigned)
Kybella Procedure Note  Procedure: Cosmetic deoxycholic acid injection (2 vials were used)  Pre-operative Diagnosis: Submental fullness  Post-operative Diagnosis: Same  Complications:  None  Brief history: The patient desires improvement in the appearance of moderate to severe convexity or fullness associated with submental fat. I discussed with the patient this proposed procedure of Kybella, which is customized depending on the particular needs of the patient. It is performed on the submental area for reduction in the fat.  The alternatives were discussed with the patient. The risks were addressed including bleeding, scarring, infection, damage to deeper structures, asymmetry, numbness, formation of areas of hardness, swelling, nodules, skin ulceration, headache, alopecia, difficulty swallowing, and muscle weakness. Additionally, marginal mandibular nerve injury could occur and is manifested as an asymmetric smile or facial muscle weakness.  The individual's choice to undergo a surgical procedure is based on the comparison of risks to potential benefits. Injections do not arrest the aging process or produce permanent tightening of the skin.  Operative intervention maybe necessary to maintain the results. The patient understands and wishes to proceed. An informed consent was signed and informational brochures given to her prior to the procedure.  Procedure: After removing the numbing cream, the area was prepped with alcohol and dried with a clean gauze. Using a clean technique, the submental area was palpated and pinched.  The skin at the area was pulled and there was appropriate elasticity noted without excessive skin laxity. The patient was asked to tense the platysma to define the subcutaneous fat between the dermis and platysma.  A skin marker was used to mark the anterior, posterior and lateral borders of the submental fat compartment. The "no treatment Zone" was marked. The treatment zone was  injected in the subcutaneous layer. The grid was placed on the skin with the water over it for activation of the ink.  The clear protective sheet was removed leaving the markings.  The dots outside the previously marked treatment zone were removed with an alcohol wipe.  The pre-platysmal fat was pinched with 2 fingers.  Each dot was injected perpendicular to the skin with .2 cc of Kybella using a 1 ml syring and a 30 gauge needle. The ink was wiped off the skin with an alcohol wipe and a cold pack was applied to the treatment area. No complications were noted. Light pressure with an ice pack was held for 5 minutes. She was instructed on post-operative care including no massage, heavy activity, work out or facial for 24 hours.  Deoxycholic Acid x 2 Vials LOT:  [id number]

## 2022-09-21 ENCOUNTER — Telehealth: Payer: Self-pay | Admitting: Plastic Surgery

## 2022-09-21 NOTE — Telephone Encounter (Signed)
LVM regarding question she had about a 1200.00 payment she made in 2022.  She paid 1200 on 11/22/20 and it was all refunded except for 50.00 on 04/29/21.  [facility] took that 50 and paid it on an unpaid balance.

## 2023-05-27 DIAGNOSIS — J312 Chronic pharyngitis: Secondary | ICD-10-CM | POA: Insufficient documentation

## 2023-07-05 ENCOUNTER — Encounter: Payer: Self-pay | Admitting: Cardiology

## 2023-07-05 ENCOUNTER — Ambulatory Visit: Payer: Commercial Managed Care - PPO | Attending: Cardiology | Admitting: Cardiology

## 2023-07-05 VITALS — BP 146/94 | HR 84 | Ht 66.0 in | Wt 184.0 lb

## 2023-07-05 DIAGNOSIS — I471 Supraventricular tachycardia, unspecified: Secondary | ICD-10-CM | POA: Diagnosis not present

## 2023-07-05 DIAGNOSIS — R002 Palpitations: Secondary | ICD-10-CM | POA: Diagnosis not present

## 2023-07-05 DIAGNOSIS — I10 Essential (primary) hypertension: Secondary | ICD-10-CM

## 2023-07-05 DIAGNOSIS — E782 Mixed hyperlipidemia: Secondary | ICD-10-CM

## 2023-07-05 MED ORDER — DILTIAZEM HCL ER COATED BEADS 180 MG PO CP24: 180.0000 mg | ORAL_CAPSULE | Freq: Every day | ORAL | 3 refills | Status: DC

## 2023-07-05 NOTE — Addendum Note (Signed)
Addended by: Baldo Ash D on: 07/05/2023 04:25 PM   Modules accepted: Orders

## 2023-07-05 NOTE — Patient Instructions (Addendum)
Medication Instructions:  Your physician recommends that you continue on your current medications as directed. Please refer to the Current Medication list given to you today.  *If you need a refill on your cardiac medications before your next appointment, please call your pharmacy*   Lab Work: Your physician recommends that you return for lab work in: when fasting You need to have labs done when you are fasting.  You can come Monday through Friday 8:30 am to 12:00 pm and 1:15 to 4:30. You do not need to make an appointment as the order has already been placed. The labs you are going to have done are LPa and Lipids.    Testing/Procedures: None Ordered   Follow-Up: At [facility], you and your health needs are our priority.  As part of our continuing mission to provide you with exceptional heart care, we have created designated Provider Care Teams.  These Care Teams include your primary Cardiologist (physician) and Advanced Practice Providers (APPs -  Physician Assistants and Nurse Practitioners) who all work together to provide you with the care you need, when you need it.  We recommend signing up for the patient portal called "MyChart".  Sign up information is provided on this After Visit Summary.  MyChart is used to connect with patients for Virtual Visits (Telemedicine).  Patients are able to view lab/test results, encounter notes, upcoming appointments, etc.  Non-urgent messages can be sent to your provider as well.   To learn more about what you can do with MyChart, go to [email or web address].    Your next appointment:   12 month(s)  The format for your next appointment:   In Person  Provider:   Gypsy Balsam, MD    Other Instructions NA

## 2023-07-05 NOTE — Progress Notes (Signed)
Cardiology Office Note:    Date:  07/05/2023   ID:  [name], DOB June 28, 1971, MRN [id number]  PCP:  Hadley Pen, MD  Cardiologist:  Gypsy Balsam, MD    Referring MD: Hadley Pen, MD   No chief complaint on file.   History of Present Illness:    [name] is a 52 y.o. female past medical history significant for supraventricular tachycardia, nonsustained ventricular tachycardia, essential hypertension, dyslipidemia.  Comes today to months for follow-up overall doing great.  Asymptomatic denies have any chest pain tightness squeezing pressure burning chest no palpitations.  Walks every day half an hour have no difficulty doing it  Past Medical History:  Diagnosis Date   Anemia    Asthma    as child   Atypical chest pain 04/13/2017   Back pain    Cellulitis of umbilicus 07/02/2018   Chronic pain of both knees 10/18/2015   Class 1 obesity with serious comorbidity and body mass index (BMI) of 30.0 to 30.9 in adult 08/31/2016   Starting BMI greater then 30   Complication of anesthesia    Cyst of left ovary 04/11/2020   Cystocele with incomplete uterovaginal prolapse 06/21/2018   Depression 02/24/2017   Dermatochalasis of both eyelids 03/19/2020   Elevated serum creatinine 05/26/2017   Encounter for annual physical examination excluding gynecological examination in a patient older than 17 years 02/20/2019   Encounter for counseling 12/01/2019   Encounter for laboratory testing for COVID-19 virus 02/20/2019   Essential hypertension 02/24/2017   Fibroids, intramural 04/05/2017   Gastroesophageal reflux disease without esophagitis 04/05/2017   GERD (gastroesophageal reflux disease)    Headache    History of migraine headaches 04/05/2017   Hyperlipidemia 02/24/2017   Hypertension    Insulin resistance 09/01/2017   Joint pain    Major depressive disorder, recurrent episode (HCC) 04/05/2017   Migraine without status migrainosus, not intractable 05/26/2017   Migraines    Mild  intermittent asthma without complication 04/05/2017   Need for influenza vaccination 04/14/2018   Other constipation 05/08/2019   Other hyperlipidemia 09/01/2017   Overweight with body mass index (BMI) 25.0-29.9 05/11/2018   Starting BMI greater then 30   Palpitations 12/01/2019   Parathyroid abnormality (HCC)    Parathyroid adenoma 01/03/2016   Plantar fasciitis 11/16/2018   PONV (postoperative nausea and vomiting)    Rhinosinusitis 04/05/2017   SVT (supraventricular tachycardia) (HCC) 01/19/2020   Swelling    feet and legs   Swelling of both lower extremities 12/01/2019   Uterine prolapse 04/05/2017   Vitamin D deficiency     Past Surgical History:  Procedure Laterality Date   ABDOMINAL HYSTERECTOMY     BROW LIFT Bilateral 03/13/2020   Procedure: Bilteral upper lid blepharoplasty;  Surgeon: Peggye Form, DO;  Location: [facility];  Service: Plastics;  Laterality: Bilateral;  1.5 hours   ESOPHAGOGASTRODUODENOSCOPY  09/13/2003   Normal EGD.    EXTRACORPOREAL SHOCK WAVE LITHOTRIPSY Left 02/12/2020   Procedure: EXTRACORPOREAL SHOCK WAVE LITHOTRIPSY (ESWL);  Surgeon: Jerilee Field, MD;  Location: [facility];  Service: Urology;  Laterality: Left;   FOOT SURGERY Bilateral 2001   PARATHYROIDECTOMY N/A 01/03/2016   Procedure: PARATHYROIDECTOMY;  Surgeon: Newman Pies, MD;  Location: [facility];  Service: ENT;  Laterality: N/A;   TUBAL LIGATION      Current Medications: Current Meds  Medication Sig   diltiazem (CARDIZEM CD) 180 MG 24 hr capsule Take 1 capsule (180 mg  total) by mouth daily. Patient needs an appointment for further refills.   ibuprofen (ADVIL) 800 MG tablet Take 800 mg by mouth 3 (three) times daily.   Multiple Vitamins-Minerals (ONE DAILY CALCIUM/IRON) TABS Take 1 tablet by mouth every other day.   omeprazole (PRILOSEC) 40 MG capsule Take 40 mg by mouth in the morning and at bedtime.   phentermine (ADIPEX-P) 37.5 MG tablet Take  37.5 mg by mouth daily.   SUMAtriptan (IMITREX) 50 MG tablet Take as directed. Max 200mg  per day (4 tabs)     Allergies:   Patient has no known allergies.   Social History   Socioeconomic History   Marital status: Married    Spouse name: Ronnie Picone   Number of children: Not on file   Years of education: Not on file   Highest education level: Not on file  Occupational History   Occupation: CMA    Employer: [employer]  Tobacco Use   Smoking status: Never   Smokeless tobacco: Never  Vaping Use   Vaping status: Never Used  Substance and Sexual Activity   Alcohol use: No   Drug use: No   Sexual activity: Yes    Partners: Male    Birth control/protection: Surgical    Comment: TL  Other Topics Concern   Not on file  Social History Narrative   Not on file   Social Drivers of Health   Financial Resource Strain: Not on file  Food Insecurity: Low Risk  (05/25/2023)   Received from [facility]   Hunger Vital Sign    Worried About Running Out of Food in the Last Year: Never true    Ran Out of Food in the Last Year: Never true  Transportation Needs: No Transportation Needs (05/25/2023)   Received from [facility]    In the past 12 months, has lack of reliable transportation kept you from medical appointments, meetings, work or from getting things needed for daily living? : No  Physical Activity: Not on file  Stress: Not on file  Social Connections: Not on file     Family History: The patient's family history includes Cancer in her maternal grandmother and paternal grandmother; Diabetes in her paternal grandmother; Heart disease in her paternal grandmother; Hypertension in her father; Melanoma in her maternal grandfather. There is no history of Colon cancer, Colon polyps, Esophageal cancer, Rectal cancer, Stomach cancer, or Breast cancer. ROS:   Please see the history of present illness.    All 14 point review of systems negative except as described per  history of present illness  EKGs/Labs/Other Studies Reviewed:    EKG Interpretation Date/Time:  Monday July 05 2023 15:37:12 EST Ventricular Rate:  84 PR Interval:  146 QRS Duration:  86 QT Interval:  378 QTC Calculation: 446 R Axis:   -10  Text Interpretation: Normal sinus rhythm Normal ECG No previous ECGs available Confirmed by Gypsy Balsam [phone]) on 07/05/2023 4:03:27 PM    Recent Labs: No results found for requested labs within last 365 days.  Recent Lipid Panel    Component Value Date/Time   CHOL 205 (H) 02/06/2019 1010   TRIG 87 02/06/2019 1010   HDL 56 02/06/2019 1010   LDLCALC 132 (H) 02/06/2019 1010   LDLDIRECT 140 (H) 05/05/2022 1528    Physical Exam:    VS:  BP (!) 146/94   Pulse 84   Ht 5\' 6"  (1.676 m)   Wt 184 lb (83.5 kg)   LMP 04/28/2017 (  Exact Date)   SpO2 96%   BMI 29.70 kg/m     Wt Readings from Last 3 Encounters:  07/05/23 184 lb (83.5 kg)  05/05/22 200 lb 6.4 oz (90.9 kg)  11/22/20 188 lb (85.3 kg)     GEN:  Well nourished, well developed in no acute distress HEENT: Normal NECK: No JVD; No carotid bruits LYMPHATICS: No lymphadenopathy CARDIAC: RRR, no murmurs, no rubs, no gallops RESPIRATORY:  Clear to auscultation without rales, wheezing or rhonchi  ABDOMEN: Soft, non-tender, non-distended MUSCULOSKELETAL:  No edema; No deformity  SKIN: Warm and dry LOWER EXTREMITIES: no swelling NEUROLOGIC:  Alert and oriented x 3 PSYCHIATRIC:  Normal affect   ASSESSMENT:    1. Palpitations   2. SVT (supraventricular tachycardia) (HCC)   3. Essential hypertension   4. Mixed hyperlipidemia    PLAN:    In order of problems listed above:  Supraventricular tachycardia denies having any continue supportive care. Essential hypertension blood pressure elevated today in the office she will get blood pressure monitor and check blood pressure on the regular basis and send results to me. Mixed dyslipidemia I will put order for fasting lipid  profile she may require calcium score   Medication Adjustments/Labs and Tests Ordered: Current medicines are reviewed at length with the patient today.  Concerns regarding medicines are outlined above.  Orders Placed This Encounter  Procedures   EKG 12-Lead   Medication changes: No orders of the defined types were placed in this encounter.   Signed, Georgeanna Lea, MD, [facility] 07/05/2023 4:13 PM    [facility]

## 2024-02-08 ENCOUNTER — Ambulatory Visit: Attending: Cardiology

## 2024-02-08 VITALS — BP 174/100 | Ht 66.0 in | Wt 198.0 lb

## 2024-02-08 DIAGNOSIS — I10 Essential (primary) hypertension: Secondary | ICD-10-CM

## 2024-02-08 MED ORDER — DILTIAZEM HCL ER COATED BEADS 240 MG PO CP24
240.0000 mg | ORAL_CAPSULE | Freq: Every day | ORAL | 3 refills | Status: AC
Start: 2024-02-08 — End: 2024-05-30

## 2024-02-08 NOTE — Progress Notes (Signed)
   Nurse Visit   Date of Encounter: 02/08/2024 ID: GABY HARNEY, DOB [birth year], MRN [id number]  PCP:  Silver Lamar LABOR, MD   [facility] Providers Cardiologist:  None      Visit Details   VS:  LMP 04/28/2017 (Exact Date)  , BMI There is no height or weight on file to calculate BMI.  Wt Readings from Last 3 Encounters:  07/05/23 184 lb (83.5 kg)  05/05/22 200 lb 6.4 oz (90.9 kg)  11/22/20 188 lb (85.3 kg)     Reason for visit: BP elevated Performed today: Vitals, EKG Changes (medications, testing, etc.) : Increase Diltiazem  to 240mg  daily Length of Visit: 15 minutes    Medications Adjustments/Labs and Tests Ordered: Orders Placed This Encounter  Procedures   EKG 12-Lead   No orders of the defined types were placed in this encounter.    Signed, Olam JONETTA Lesches, RN  02/08/2024 4:24 PM

## 2024-02-11 ENCOUNTER — Ambulatory Visit

## 2024-05-30 ENCOUNTER — Ambulatory Visit: Admitting: Allergy

## 2024-05-30 ENCOUNTER — Encounter: Payer: Self-pay | Admitting: Allergy

## 2024-05-30 VITALS — BP 136/82 | HR 84 | Resp 16 | Ht 66.0 in | Wt 182.2 lb

## 2024-05-30 DIAGNOSIS — J310 Chronic rhinitis: Secondary | ICD-10-CM | POA: Diagnosis not present

## 2024-05-30 DIAGNOSIS — R0982 Postnasal drip: Secondary | ICD-10-CM

## 2024-05-30 MED ORDER — RYALTRIS 665-25 MCG/ACT NA SUSP: 2.0000 | Freq: Two times a day (BID) | NASAL | 5 refills | Status: AC

## 2024-05-30 NOTE — Progress Notes (Signed)
 New Patient Note  RE: [name] MRN: [id number] DOB: 1971-03-16 Date of Office Visit: 05/30/2024  Primary care provider: Silver Lamar LABOR, MD  Chief Complaint: nasal congestion/allergies  History of present illness: [name] is a 53 y.o. female presenting today for evaluation of nasal drainage. Discussed the use of AI scribe software for clinical note transcription with the patient, who gave verbal consent to proceed.  She has experienced chronic congestion, drainage, and postnasal drip since childhood, with symptoms worsening after her tonsillectomy last year. The drainage is described as having a 'foul taste' and affects her ability to taste foods like she could before. She experiences throat clearing throughout the day and occasional coughing, though not severe.  Her symptoms are present year-round but tend to worsen in the winter. She does not currently take any medications for allergies and has not identified any specific allergens, though she suspects environmental factors may be contributing to her symptoms.  She has a history of asthma during childhood, which resolved by her teenage years. She also denies any history of eczema, dry itchy skin, or food allergies.       Review of systems: 10pt ROS negative unless noted above in HPI  Past medical history: Past Medical History:  Diagnosis Date   Anemia    Asthma    as child   Atypical chest pain 04/13/2017   Back pain    Cellulitis of umbilicus 07/02/2018   Chronic pain of both knees 10/18/2015   Class 1 obesity with serious comorbidity and body mass index (BMI) of 30.0 to 30.9 in adult 08/31/2016   Starting BMI greater then 30   Complication of anesthesia    Cyst of left ovary 04/11/2020   Cystocele with incomplete uterovaginal prolapse 06/21/2018   Depression 02/24/2017   Dermatochalasis of both eyelids 03/19/2020   Elevated serum creatinine 05/26/2017   Encounter for annual physical examination excluding gynecological  examination in a patient older than 17 years 02/20/2019   Encounter for counseling 12/01/2019   Encounter for laboratory testing for COVID-19 virus 02/20/2019   Essential hypertension 02/24/2017   Fibroids, intramural 04/05/2017   Gastroesophageal reflux disease without esophagitis 04/05/2017   GERD (gastroesophageal reflux disease)    Headache    History of migraine headaches 04/05/2017   Hyperlipidemia 02/24/2017   Hypertension    Insulin  resistance 09/01/2017   Joint pain    Major depressive disorder, recurrent episode 04/05/2017   Migraine without status migrainosus, not intractable 05/26/2017   Migraines    Mild intermittent asthma without complication 04/05/2017   Need for influenza vaccination 04/14/2018   Other constipation 05/08/2019   Other hyperlipidemia 09/01/2017   Overweight with body mass index (BMI) 25.0-29.9 05/11/2018   Starting BMI greater then 30   Palpitations 12/01/2019   Parathyroid  abnormality    Parathyroid  adenoma 01/03/2016   Plantar fasciitis 11/16/2018   PONV (postoperative nausea and vomiting)    Rhinosinusitis 04/05/2017   SVT (supraventricular tachycardia) 01/19/2020   Swelling    feet and legs   Swelling of both lower extremities 12/01/2019   Uterine prolapse 04/05/2017   Vitamin D  deficiency     Past surgical history: Past Surgical History:  Procedure Laterality Date   ABDOMINAL HYSTERECTOMY     BROW LIFT Bilateral 03/13/2020   Procedure: Bilteral upper lid blepharoplasty;  Surgeon: Lowery Estefana RAMAN, DO;  Location: [facility];  Service: Plastics;  Laterality: Bilateral;  1.5 hours   ESOPHAGOGASTRODUODENOSCOPY  09/13/2003   Normal  EGD.    EXTRACORPOREAL SHOCK WAVE LITHOTRIPSY Left 02/12/2020   Procedure: EXTRACORPOREAL SHOCK WAVE LITHOTRIPSY (ESWL);  Surgeon: Nieves Cough, MD;  Location: [facility];  Service: Urology;  Laterality: Left;   FOOT SURGERY Bilateral 2001   PARATHYROIDECTOMY N/A 01/03/2016   Procedure:  PARATHYROIDECTOMY;  Surgeon: Daniel Moccasin, MD;  Location: [facility];  Service: ENT;  Laterality: N/A;   TONSILLECTOMY  2024   TUBAL LIGATION      Family history:  Family History  Problem Relation Age of Onset   Hypertension Father    Cancer Maternal Grandmother    Melanoma Maternal Grandfather    Heart disease Paternal Grandmother    Cancer Paternal Grandmother    Diabetes Paternal Grandmother    Colon cancer Neg Hx    Colon polyps Neg Hx    Esophageal cancer Neg Hx    Rectal cancer Neg Hx    Stomach cancer Neg Hx    Breast cancer Neg Hx     Social history: Lives in a home with carpeting with electric and heat pump heating with heat pump cooling.  Dogs in the home.  No concern for water damage, mildew or roaches in the home.  She is a CMA.  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    Medication List: Current Outpatient Medications  Medication Sig Dispense Refill   diltiazem  (CARDIZEM  CD) 240 MG 24 hr capsule Take 1 capsule (240 mg total) by mouth daily. 90 capsule 3   ibuprofen  (ADVIL ) 800 MG tablet Take 800 mg by mouth 3 (three) times daily.     Multiple Vitamins-Minerals (ONE DAILY CALCIUM /IRON) TABS Take 1 tablet by mouth every other day.     SUMAtriptan  (IMITREX ) 50 MG tablet Take as directed. Max 200mg  per day (4 tabs) 27 tablet 2   WEGOVY 1 MG/0.5ML SOAJ SQ injection Inject 1 mg into the skin.     No current facility-administered medications for this visit.    Known medication allergies: No Known Allergies   Physical examination: Blood pressure 136/82, pulse 84, resp. rate 16, height 5' 6 (1.676 m), weight 182 lb 3.2 oz (82.6 kg), last menstrual period 04/28/2017, SpO2 98%.  General: Alert, interactive, in no acute distress. HEENT: PERRLA, TMs pearly gray, turbinates moderately edematous with clear discharge, post-pharynx non erythematous. Neck: Supple without lymphadenopathy. Lungs: Clear to auscultation without wheezing, rhonchi or rales. {no increased work of  breathing. CV: Normal S1, S2 without murmurs. Abdomen: Nondistended, nontender. Skin: Warm and dry, without lesions or rashes. Extremities:  No clubbing, cyanosis or edema. Neuro:   Grossly intact.  Diagnostics/Labs: None today  Assessment and plan: Chronic nasal congestion and postnasal drip, likely allergic rhinitis  Symptoms persisting year-round and worsening in winter.  - Schedule allergy testing to identify environmental allergens.  Hold all antihistamines 3 days prior to skin testing - Prescribe Ryaltris  nasal spray for congestion and drainage control. Use 2 sprays each nostril twice a day for nasal congestion or drainage.   With using nasal sprays point tip of bottle toward eye on same side nostril and lean head slightly forward for best technique.   If not covered, use over-the-counter Nasonex (for congestion) and Astepro ( for drainage) separately. - Provided saline rinse kit for nasal irrigation to improve nasal spray efficacy and relieve congestion.  Use saline rinse kit prior to nasal spray use.  Use distilled water or boil water and bring to room temperature (do not use tap water) and keep mouth open during entire rinse.  -  Consider adding an oral antihistamine if symptoms persist.   Schedule skin testing and hold antihistamines for 3 days (Env 1-55)  I appreciate the opportunity to take part in [name]'s care. Please do not hesitate to contact me with questions.  Sincerely,   Danita Brain, MD Allergy/Immunology [facility]

## 2024-05-30 NOTE — Patient Instructions (Signed)
 Chronic nasal congestion and postnasal drip, likely allergic rhinitis  Symptoms persisting year-round and worsening in winter.  - Schedule allergy testing to identify environmental allergens.  Hold all antihistamines 3 days prior to skin testing - Prescribe Ryaltris  nasal spray for congestion and drainage control. Use 2 sprays each nostril twice a day for nasal congestion or drainage.   With using nasal sprays point tip of bottle toward eye on same side nostril and lean head slightly forward for best technique.   If not covered, use over-the-counter Nasonex (for congestion) and Astepro ( for drainage) separately. - Provided saline rinse kit for nasal irrigation to improve nasal spray efficacy and relieve congestion.  Use saline rinse kit prior to nasal spray use.  Use distilled water or boil water and bring to room temperature (do not use tap water) and keep mouth open during entire rinse.  - Consider adding an oral antihistamine if symptoms persist.   Schedule skin testing and hold antihistamines for 3 days

## 2024-06-06 ENCOUNTER — Ambulatory Visit: Admitting: Allergy
